# Patient Record
Sex: Female | Born: 1940 | Race: White | Hispanic: No | Marital: Married | Smoking: Former smoker
Health system: Southern US, Community
[De-identification: ages and names within clinical notes are randomized; demographics above are authoritative.]

## PROBLEM LIST (undated history)

## (undated) DIAGNOSIS — F319 Bipolar disorder, unspecified: Secondary | ICD-10-CM

## (undated) DIAGNOSIS — R251 Tremor, unspecified: Secondary | ICD-10-CM

## (undated) DIAGNOSIS — K219 Gastro-esophageal reflux disease without esophagitis: Secondary | ICD-10-CM

## (undated) DIAGNOSIS — F329 Major depressive disorder, single episode, unspecified: Secondary | ICD-10-CM

## (undated) DIAGNOSIS — F32A Depression, unspecified: Secondary | ICD-10-CM

## (undated) DIAGNOSIS — F419 Anxiety disorder, unspecified: Secondary | ICD-10-CM

## (undated) DIAGNOSIS — R7303 Prediabetes: Secondary | ICD-10-CM

## (undated) DIAGNOSIS — E041 Nontoxic single thyroid nodule: Secondary | ICD-10-CM

## (undated) DIAGNOSIS — R0982 Postnasal drip: Secondary | ICD-10-CM

## (undated) DIAGNOSIS — I10 Essential (primary) hypertension: Secondary | ICD-10-CM

## (undated) DIAGNOSIS — F209 Schizophrenia, unspecified: Secondary | ICD-10-CM

## (undated) HISTORY — PX: EYE SURGERY: SHX253

## (undated) HISTORY — PX: TONSILLECTOMY: SUR1361

---

## 2017-01-08 ENCOUNTER — Emergency Department: Payer: Medicare Other

## 2017-01-08 ENCOUNTER — Emergency Department
Admission: EM | Admit: 2017-01-08 | Discharge: 2017-01-08 | Disposition: A | Discharge status: Home / Self Care | Payer: Medicare Other | Attending: Emergency Medicine | Admitting: Emergency Medicine

## 2017-01-08 DIAGNOSIS — Y998 Other external cause status: Secondary | ICD-10-CM | POA: Insufficient documentation

## 2017-01-08 DIAGNOSIS — S0012XA Contusion of left eyelid and periocular area, initial encounter: Secondary | ICD-10-CM | POA: Diagnosis not present

## 2017-01-08 DIAGNOSIS — R2689 Other abnormalities of gait and mobility: Secondary | ICD-10-CM | POA: Diagnosis present

## 2017-01-08 DIAGNOSIS — W06XXXA Fall from bed, initial encounter: Secondary | ICD-10-CM | POA: Diagnosis not present

## 2017-01-08 DIAGNOSIS — J019 Acute sinusitis, unspecified: Secondary | ICD-10-CM | POA: Diagnosis not present

## 2017-01-08 DIAGNOSIS — Y9389 Activity, other specified: Secondary | ICD-10-CM | POA: Insufficient documentation

## 2017-01-08 DIAGNOSIS — Y92122 Bedroom in nursing home as the place of occurrence of the external cause: Secondary | ICD-10-CM | POA: Diagnosis not present

## 2017-01-08 LAB — CBC
HCT: 36.8 % (ref 35.0–47.0)
Hemoglobin: 12.6 g/dL (ref 12.0–16.0)
MCH: 32.2 pg (ref 26.0–34.0)
MCHC: 34.3 g/dL (ref 32.0–36.0)
MCV: 93.9 fL (ref 80.0–100.0)
PLATELETS: 243 10*3/uL (ref 150–440)
RBC: 3.92 MIL/uL (ref 3.80–5.20)
RDW: 13.2 % (ref 11.5–14.5)
WBC: 7.7 10*3/uL (ref 3.6–11.0)

## 2017-01-08 LAB — URINALYSIS, COMPLETE (UACMP) WITH MICROSCOPIC
BACTERIA UA: NONE SEEN
BILIRUBIN URINE: NEGATIVE
Glucose, UA: NEGATIVE mg/dL
Hgb urine dipstick: NEGATIVE
KETONES UR: NEGATIVE mg/dL
LEUKOCYTES UA: NEGATIVE
Nitrite: NEGATIVE
PH: 7 (ref 5.0–8.0)
PROTEIN: NEGATIVE mg/dL
RBC / HPF: NONE SEEN RBC/hpf (ref 0–5)
Specific Gravity, Urine: 1.003 — ABNORMAL LOW (ref 1.005–1.030)

## 2017-01-08 LAB — COMPREHENSIVE METABOLIC PANEL
ALBUMIN: 3.6 g/dL (ref 3.5–5.0)
ALK PHOS: 82 U/L (ref 38–126)
ALT: 14 U/L (ref 14–54)
AST: 17 U/L (ref 15–41)
Anion gap: 5 (ref 5–15)
BUN: 11 mg/dL (ref 6–20)
CHLORIDE: 107 mmol/L (ref 101–111)
CO2: 26 mmol/L (ref 22–32)
CREATININE: 0.63 mg/dL (ref 0.44–1.00)
Calcium: 9.6 mg/dL (ref 8.9–10.3)
GFR calc Af Amer: >60 mL/min (ref >60)
GFR calc non Af Amer: >60 mL/min (ref >60)
GLUCOSE: 125 mg/dL — AB (ref 65–99)
Potassium: 3.9 mmol/L (ref 3.5–5.1)
SODIUM: 138 mmol/L (ref 135–145)
Total Bilirubin: 0.8 mg/dL (ref 0.3–1.2)
Total Protein: 6.6 g/dL (ref 6.5–8.1)

## 2017-01-08 LAB — TROPONIN I: Troponin I: <0.03 ng/mL (ref <0.03)

## 2017-01-08 IMAGING — CT CT HEAD W/O CM
3 of 6 series · 16 of 47 positions shown, 19 images · non-contrast
Comparison: None.

CLINICAL DATA: Fall 2 days prior with left periorbital bruising.

EXAM:
CT HEAD WITHOUT CONTRAST
TECHNIQUE: Contiguous axial images were obtained from the base of the skull
through the vertex without intravenous contrast.

[Series 3: head wo · axial · 0.42mm/px · z∈[-49,+86]mm · 11 of 31 slices shown, 14 images]
[im 2/31  brain]
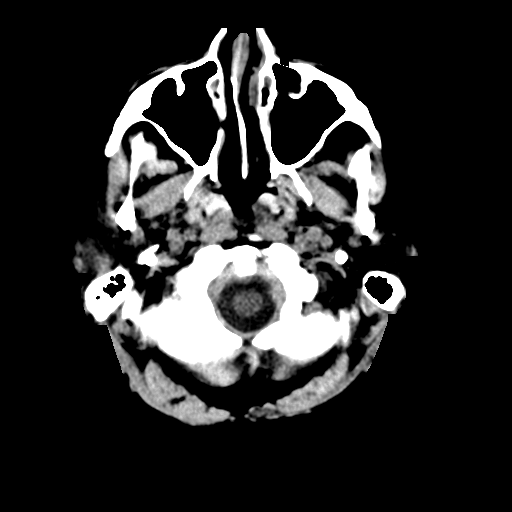
[im 2/31  bone]
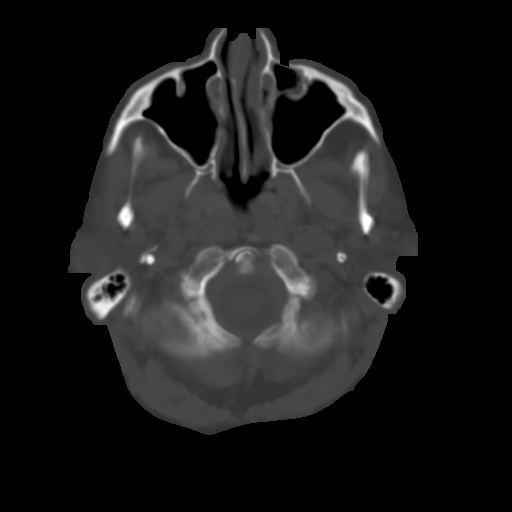
[im 6/31  brain]
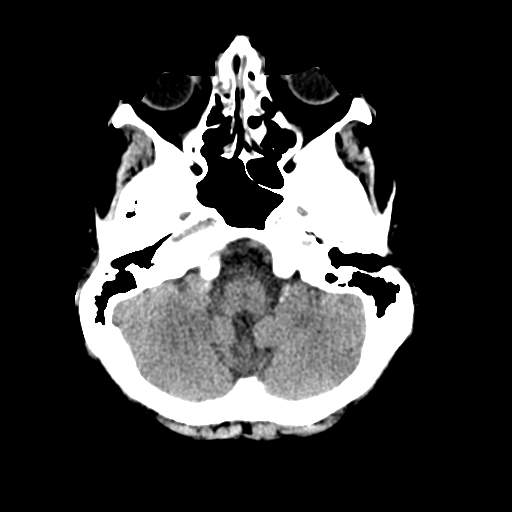
[im 7/31  brain]
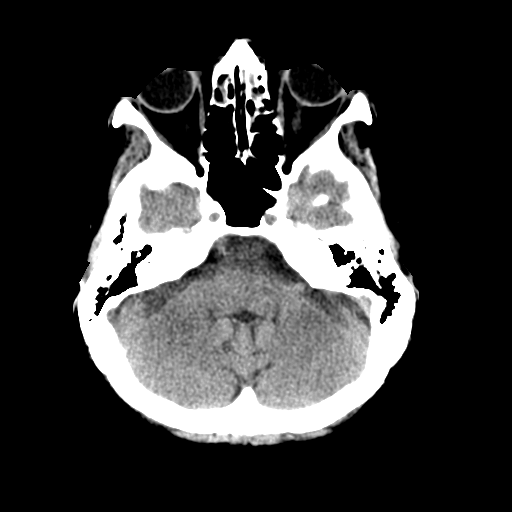
[im 11/31  brain]
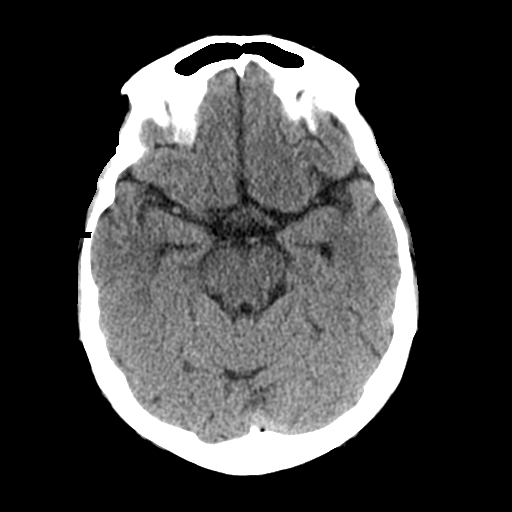
[im 12/31  brain]
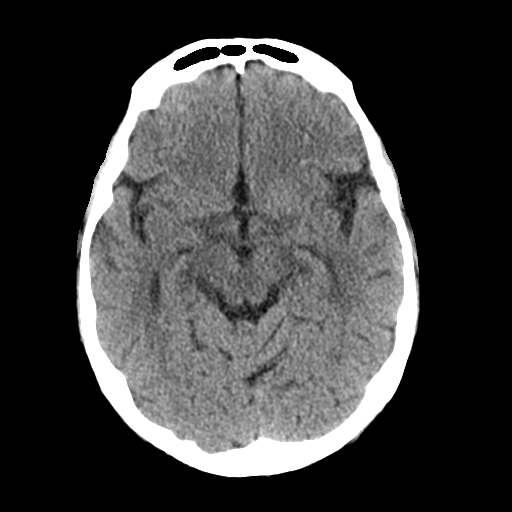
[im 12/31  bone]
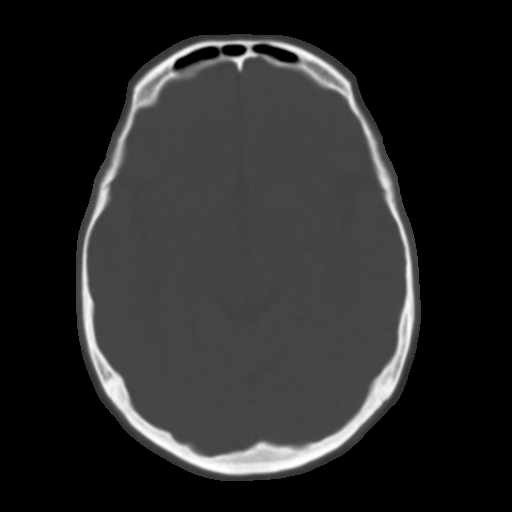
[im 16/31  brain]
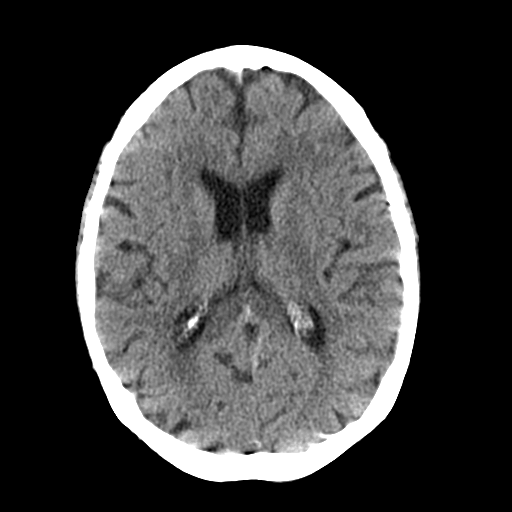
[im 19/31  brain]
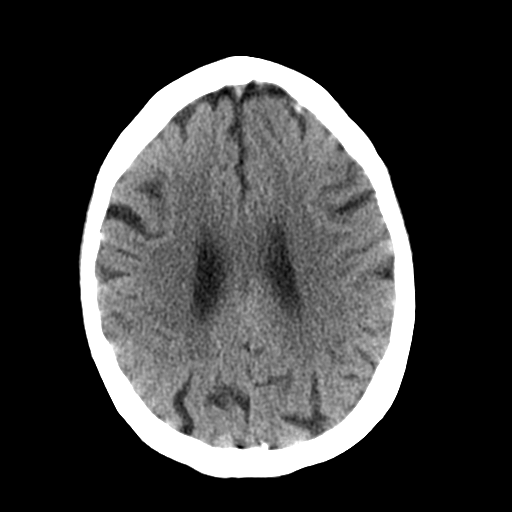
[im 21/31  brain]
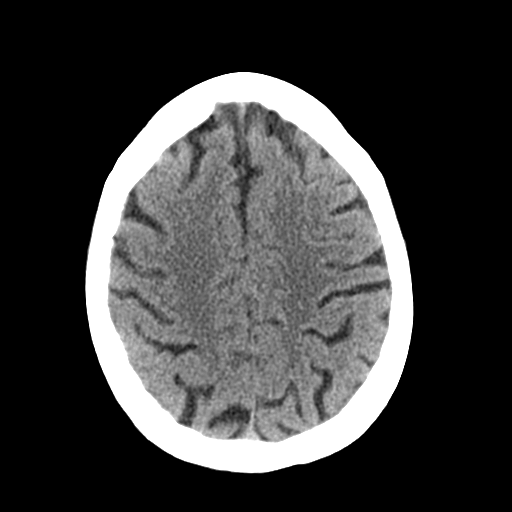
[im 24/31  brain]
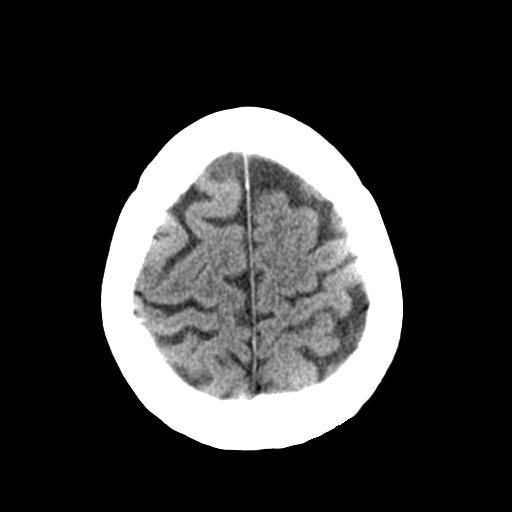
[im 24/31  bone]
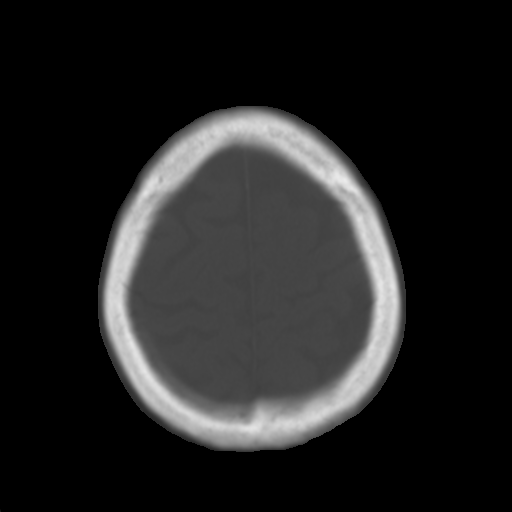
[im 26/31  brain]
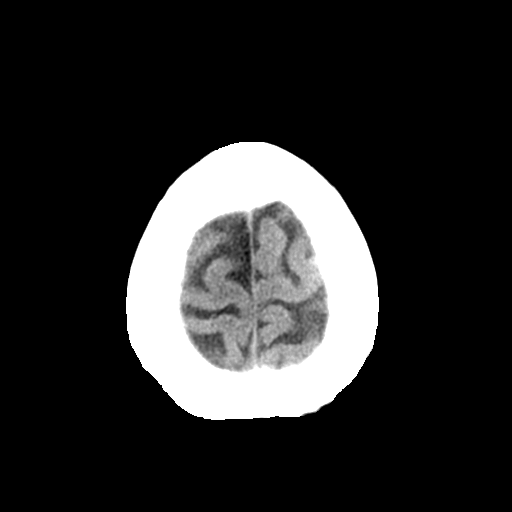
[im 29/31  brain]
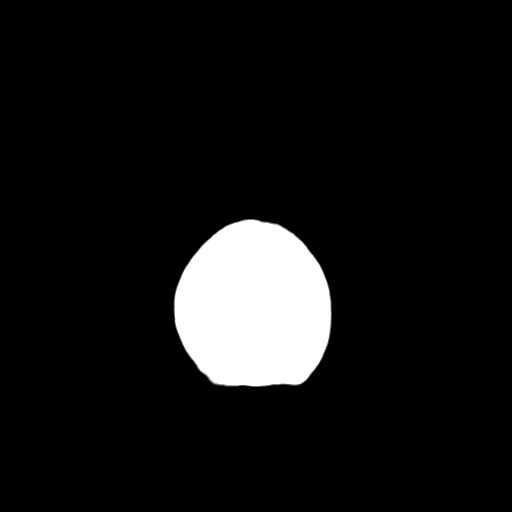

[Series 4: coronal soft tissue · coronal · 0.32mm/px · 3 of 61 slices shown]
[im 16/61  brain]
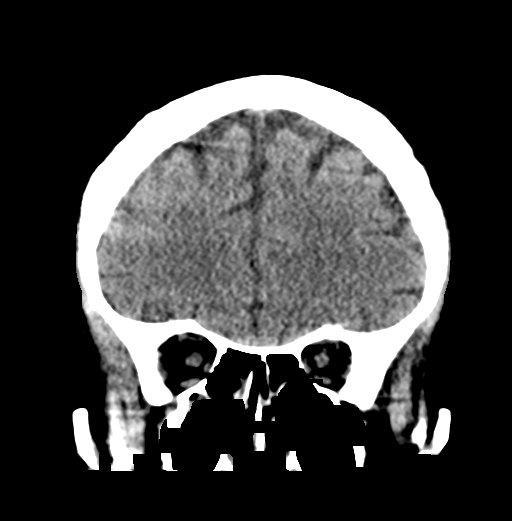
[im 31/61  brain]
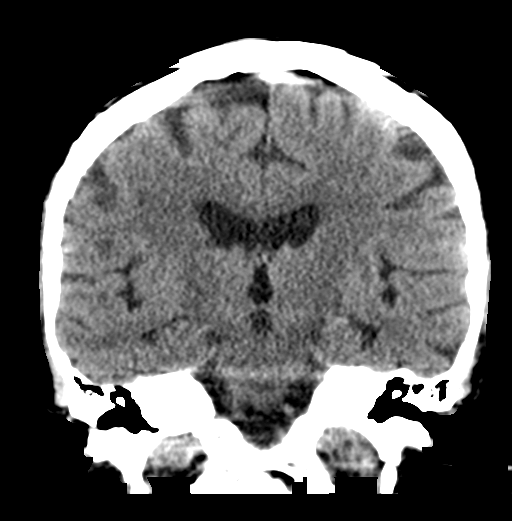
[im 46/61  brain]
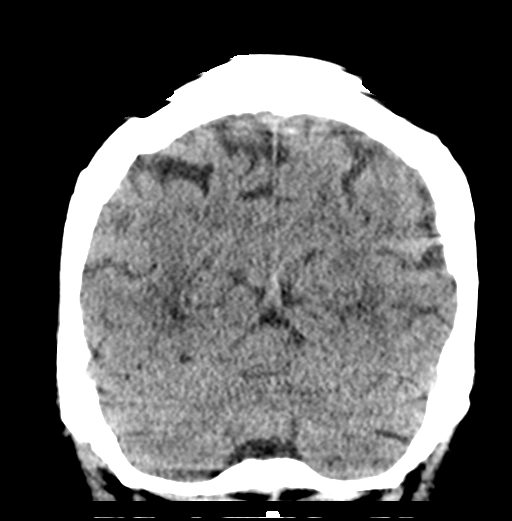

[Series 5: sagittal soft tissue · sagittal · 0.31mm/px · 2 of 49 slices shown]
[im 17/49  brain]
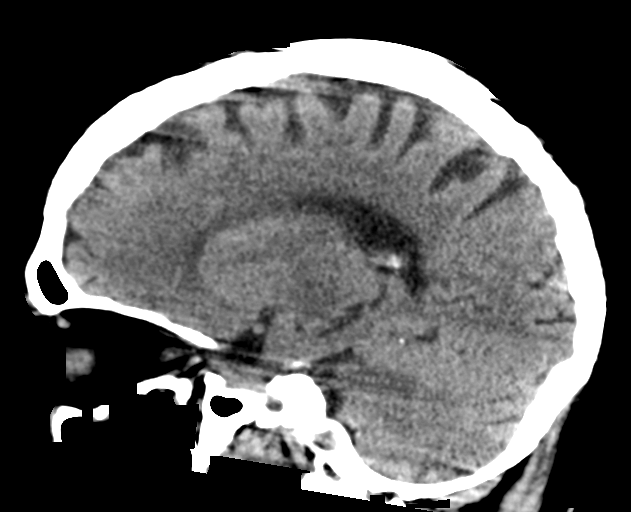
[im 33/49  brain]
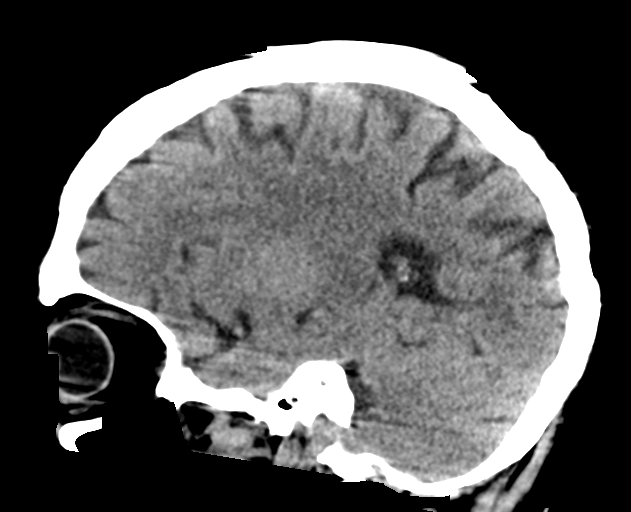

[16 of 47 positions shown; findings below may reference images not displayed]

FINDINGS: Brain: No evidence of acute infarction, hemorrhage, hydrocephalus,
extra-axial collection or mass lesion/mass effect. Mild atrophy,
normal for age.

Vascular: Atherosclerosis of skullbase vasculature without
hyperdense vessel or abnormal calcification.

Skull: Normal. Negative for fracture or focal lesion.

Sinuses/Orbits: Mucosal thickening of the ethmoid air cells and
right maxillary sinus. Mastoid air cells are clear. Visualized
orbits are unremarkable.

Other: None.
IMPRESSION: 1.  No acute intracranial abnormality.  No skull fracture.
2. Paranasal sinus inflammatory change primarily involving the
ethmoid air cells.

## 2017-01-08 MED ORDER — AMOXICILLIN-POT CLAVULANATE 875-125 MG PO TABS: 1.0000 | ORAL_TABLET | Freq: Two times a day (BID) | ORAL | 0 refills | Status: AC

## 2017-01-08 NOTE — ED Triage Notes (Signed)
Patient reports fall 2 days ago. Pt from the [facility]. Pt sent to ED for increased difficulty ambulating today.   Patient c/o pain below left ribcage. Pt has bruising left hip/lateral abdomen. Pt has bruising to left eye

## 2017-01-08 NOTE — ED Notes (Signed)
Patient transported to CT 

## 2017-01-08 NOTE — ED Provider Notes (Signed)
[facility] Emergency Department Provider Note    First MD Initiated Contact with Patient 01/08/17 0315     (approximate)  I have reviewed the triage vital signs and the nursing notes.   HISTORY  Chief Complaint Fall    HPI Danielle Steele is a 76 y.o. female presents to the emergency department via EMS from the [place] with chief complaint of difficulty with ambulation. Per EMS staff at the nursing facility informed and the patient was unsteady when she walked and stumbled to into the wall. Patient admits to rolling out of bed 2 days ago with resultant left periorbital contusion. Patient denied any loss of consciousness at that time. Patient does admit to recent nasal congestion over the past 3 days.   Past medical history  There are no active problems to display for this patient.   Past surgical history  Prior to Admission medications   Not on File    Allergies Citrus  No family history on file.  Social History Social History  Substance Use Topics  . Smoking status: Not on file  . Smokeless tobacco: Not on file  . Alcohol use Not on file    Review of Systems Constitutional: No fever/chills Eyes: No visual changes. ENT: No sore throat. Positive for nasal congestion Cardiovascular: Denies chest pain. Respiratory: Denies shortness of breath. Gastrointestinal: No abdominal pain.  No nausea, no vomiting.  No diarrhea.  No constipation. Genitourinary: Negative for dysuria. Musculoskeletal: Negative for neck pain.  Negative for back pain. Integumentary: Negative for rash. Neurological: Negative for headaches, focal weakness or numbness.   ____________________________________________   PHYSICAL EXAM:  VITAL SIGNS: ED Triage Vitals  Enc Vitals Group     BP 01/08/17 0320 (!) 119/59     Pulse Rate 01/08/17 0320 70     Resp 01/08/17 0320 15     Temp 01/08/17 0320 97.9 F (36.6 C)     Temp Source 01/08/17 0320 Oral     SpO2  01/08/17 0320 95 %     Weight 01/08/17 0319 74.3 kg (163 lb 12.8 oz)     Height 01/08/17 0319 1.664 m (5' 5.5")     Head Circumference --      Peak Flow --      Pain Score 01/08/17 0320 1     Pain Loc --      Pain Edu? --      Excl. in [place]? --     Constitutional: Alert and oriented. Well appearing and in no acute distress. Eyes: Conjunctivae are normal.  Head: Atraumatic. Ears:  Healthy appearing ear canals and clear fluid posterior TMs bilaterally. Nose: Positive for congestion/rhinnorhea. Mouth/Throat: Mucous membranes are moist.  Oropharynx non-erythematous. Neck: No stridor.   Cardiovascular: Normal rate, regular rhythm. Good peripheral circulation. Grossly normal heart sounds. Respiratory: Normal respiratory effort.  No retractions. Lungs CTAB. Gastrointestinal: Soft and nontender. No distention.  Musculoskeletal: No lower extremity tenderness nor edema. No gross deformities of extremities. Neurologic:  Normal speech and language. No gross focal neurologic deficits are appreciated.  Skin:  Skin is warm, dry and intact. No rash noted. Psychiatric: Mood and affect are normal. Speech and behavior are normal.  ____________________________________________   LABS (all labs ordered are listed, but only abnormal results are displayed)  Labs Reviewed  URINALYSIS, COMPLETE (UACMP) WITH MICROSCOPIC - Abnormal; Notable for the following:       Result Value   Color, Urine STRAW (*)    APPearance CLEAR (*)  Specific Gravity, Urine 1.003 (*)    Squamous Epithelial / LPF 0-5 (*)    All other components within normal limits  COMPREHENSIVE METABOLIC PANEL - Abnormal; Notable for the following:    Glucose, Bld 125 (*)    All other components within normal limits  CBC  TROPONIN I    RADIOLOGY I, [place] Juniper Snyders, personally viewed and evaluated these images (plain radiographs) as part of my medical decision making, as well as reviewing the written report by the radiologist.  Ct  Head Wo Contrast  Result Date: 01/08/2017 CLINICAL DATA:  Fall 2 days prior with left periorbital bruising. EXAM: CT HEAD WITHOUT CONTRAST TECHNIQUE: Contiguous axial images were obtained from the base of the skull through the vertex without intravenous contrast. COMPARISON:  None. FINDINGS: Brain: No evidence of acute infarction, hemorrhage, hydrocephalus, extra-axial collection or mass lesion/mass effect. Mild atrophy, normal for age. Vascular: Atherosclerosis of skullbase vasculature without hyperdense vessel or abnormal calcification. Skull: Normal. Negative for fracture or focal lesion. Sinuses/Orbits: Mucosal thickening of the ethmoid air cells and right maxillary sinus. Mastoid air cells are clear. Visualized orbits are unremarkable. Other: None. IMPRESSION: 1.  No acute intracranial abnormality.  No skull fracture. 2. Paranasal sinus inflammatory change primarily involving the ethmoid air cells. Electronically Signed   By: Jeb Levering M.D.   On: 01/08/2017 04:50      Procedures   ____________________________________________   INITIAL IMPRESSION / ASSESSMENT AND PLAN / ED COURSE  Pertinent labs & imaging results that were available during my care of the patient were reviewed by me and considered in my medical decision making (see chart for details).  76 year old female presenting from the [place] with unsteady gait CT head revealed evidence of sinusitis which is consistent with clinical findings on exam. Laboratory data unremarkable including urinalysis      ____________________________________________  FINAL CLINICAL IMPRESSION(S) / ED DIAGNOSES  Final diagnoses:  Acute sinusitis, recurrence not specified, unspecified location     MEDICATIONS GIVEN DURING THIS VISIT:  Medications - No data to display   NEW OUTPATIENT MEDICATIONS STARTED DURING THIS VISIT:  New Prescriptions   No medications on file    Modified Medications   No medications on file     Discontinued Medications   No medications on file     Note:  This document was prepared using Dragon voice recognition software and may include unintentional dictation errors.    Gregor Hams, MD 01/08/17 [phone]

## 2017-01-08 NOTE — ED Notes (Signed)
Called facility to give report on patient.

## 2017-01-08 NOTE — ED Notes (Signed)
Reviewed d/c instructions, follow-up care and prescription with patient. Pt verbalized understanding.

## 2017-08-31 ENCOUNTER — Encounter: Payer: Self-pay | Admitting: *Deleted

## 2017-09-07 ENCOUNTER — Ambulatory Visit
Admission: RE | Admit: 2017-09-07 | Discharge: 2017-09-07 | Disposition: A | Discharge status: Home / Self Care | Payer: Medicare Other | Source: Ambulatory Visit | Attending: Ophthalmology | Admitting: Ophthalmology

## 2017-09-07 ENCOUNTER — Encounter
Admission: RE | Disposition: A | Discharge status: Home / Self Care | Payer: Self-pay | Source: Ambulatory Visit | Attending: Ophthalmology

## 2017-09-07 ENCOUNTER — Ambulatory Visit: Payer: Medicare Other | Admitting: Certified Registered"

## 2017-09-07 ENCOUNTER — Other Ambulatory Visit: Payer: Self-pay

## 2017-09-07 ENCOUNTER — Encounter: Payer: Self-pay | Admitting: Anesthesiology

## 2017-09-07 DIAGNOSIS — I10 Essential (primary) hypertension: Secondary | ICD-10-CM | POA: Insufficient documentation

## 2017-09-07 DIAGNOSIS — Z79899 Other long term (current) drug therapy: Secondary | ICD-10-CM | POA: Diagnosis not present

## 2017-09-07 DIAGNOSIS — F250 Schizoaffective disorder, bipolar type: Secondary | ICD-10-CM | POA: Diagnosis not present

## 2017-09-07 DIAGNOSIS — Z87891 Personal history of nicotine dependence: Secondary | ICD-10-CM | POA: Diagnosis not present

## 2017-09-07 DIAGNOSIS — R251 Tremor, unspecified: Secondary | ICD-10-CM | POA: Diagnosis not present

## 2017-09-07 DIAGNOSIS — H2512 Age-related nuclear cataract, left eye: Secondary | ICD-10-CM | POA: Insufficient documentation

## 2017-09-07 DIAGNOSIS — K219 Gastro-esophageal reflux disease without esophagitis: Secondary | ICD-10-CM | POA: Insufficient documentation

## 2017-09-07 DIAGNOSIS — E559 Vitamin D deficiency, unspecified: Secondary | ICD-10-CM | POA: Insufficient documentation

## 2017-09-07 DIAGNOSIS — E785 Hyperlipidemia, unspecified: Secondary | ICD-10-CM | POA: Insufficient documentation

## 2017-09-07 DIAGNOSIS — F419 Anxiety disorder, unspecified: Secondary | ICD-10-CM | POA: Diagnosis not present

## 2017-09-07 DIAGNOSIS — M8580 Other specified disorders of bone density and structure, unspecified site: Secondary | ICD-10-CM | POA: Diagnosis not present

## 2017-09-07 HISTORY — DX: Tremor, unspecified: R25.1

## 2017-09-07 HISTORY — DX: Anxiety disorder, unspecified: F41.9

## 2017-09-07 HISTORY — DX: Prediabetes: R73.03

## 2017-09-07 HISTORY — DX: Postnasal drip: R09.82

## 2017-09-07 HISTORY — DX: Nontoxic single thyroid nodule: E04.1

## 2017-09-07 HISTORY — DX: Gastro-esophageal reflux disease without esophagitis: K21.9

## 2017-09-07 HISTORY — DX: Bipolar disorder, unspecified: F31.9

## 2017-09-07 HISTORY — DX: Depression, unspecified: F32.A

## 2017-09-07 HISTORY — DX: Schizophrenia, unspecified: F20.9

## 2017-09-07 HISTORY — PX: CATARACT EXTRACTION W/PHACO: SHX586

## 2017-09-07 HISTORY — DX: Essential (primary) hypertension: I10

## 2017-09-07 HISTORY — DX: Major depressive disorder, single episode, unspecified: F32.9

## 2017-09-07 LAB — GLUCOSE, CAPILLARY: Glucose-Capillary: 112 mg/dL — ABNORMAL HIGH (ref 65–99)

## 2017-09-07 SURGERY — PHACOEMULSIFICATION, CATARACT, WITH IOL INSERTION
Anesthesia: Monitor Anesthesia Care | Site: Eye | Laterality: Left | Wound class: Clean

## 2017-09-07 MED ORDER — EPINEPHRINE PF 1 MG/ML IJ SOLN
INTRAOCULAR | Status: DC | PRN
  Administered 2017-09-07: 12:00:00 via OPHTHALMIC

## 2017-09-07 MED ORDER — POVIDONE-IODINE 5 % OP SOLN
OPHTHALMIC | Status: DC | PRN
  Administered 2017-09-07: 1 via OPHTHALMIC

## 2017-09-07 MED ORDER — CARBACHOL 0.01 % IO SOLN
INTRAOCULAR | Status: DC | PRN
  Administered 2017-09-07: 0.5 mL via INTRAOCULAR

## 2017-09-07 MED ORDER — MOXIFLOXACIN HCL 0.5 % OP SOLN: 1.0000 [drp] | OPHTHALMIC | Status: DC | PRN

## 2017-09-07 MED ORDER — LIDOCAINE HCL (PF) 4 % IJ SOLN
INTRAOCULAR | Status: DC | PRN
  Administered 2017-09-07: 4 mL via OPHTHALMIC

## 2017-09-07 MED ORDER — NA CHONDROIT SULF-NA HYALURON 40-17 MG/ML IO SOLN
INTRAOCULAR | Status: DC | PRN
  Administered 2017-09-07: 1 mL via INTRAOCULAR

## 2017-09-07 MED ORDER — LIDOCAINE HCL (PF) 4 % IJ SOLN
INTRAMUSCULAR | Status: AC
  Filled 2017-09-07: qty 5

## 2017-09-07 MED ORDER — ARMC OPHTHALMIC DILATING DROPS
OPHTHALMIC | Status: AC
  Administered 2017-09-07: 1 via OPHTHALMIC
  Filled 2017-09-07: qty 0.4

## 2017-09-07 MED ORDER — MOXIFLOXACIN HCL 0.5 % OP SOLN
OPHTHALMIC | Status: AC
  Filled 2017-09-07: qty 3

## 2017-09-07 MED ORDER — MIDAZOLAM HCL 2 MG/2ML IJ SOLN
INTRAMUSCULAR | Status: DC | PRN
  Administered 2017-09-07: 1 mg via INTRAVENOUS

## 2017-09-07 MED ORDER — MIDAZOLAM HCL 2 MG/2ML IJ SOLN
INTRAMUSCULAR | Status: AC
  Filled 2017-09-07: qty 2

## 2017-09-07 MED ORDER — SODIUM CHLORIDE 0.9 % IV SOLN
INTRAVENOUS | Status: DC
  Administered 2017-09-07: 11:00:00 via INTRAVENOUS

## 2017-09-07 MED ORDER — FENTANYL CITRATE (PF) 100 MCG/2ML IJ SOLN
INTRAMUSCULAR | Status: AC
  Filled 2017-09-07: qty 2

## 2017-09-07 MED ORDER — POVIDONE-IODINE 5 % OP SOLN
OPHTHALMIC | Status: AC
  Filled 2017-09-07: qty 30

## 2017-09-07 MED ORDER — MOXIFLOXACIN HCL 0.5 % OP SOLN
OPHTHALMIC | Status: DC | PRN
  Administered 2017-09-07: 0.2 mL via OPHTHALMIC

## 2017-09-07 MED ORDER — ARMC OPHTHALMIC DILATING DROPS
1.0000 "application " | OPHTHALMIC | Status: AC
  Administered 2017-09-07 (×3): 1 via OPHTHALMIC

## 2017-09-07 MED ORDER — NA CHONDROIT SULF-NA HYALURON 40-17 MG/ML IO SOLN
INTRAOCULAR | Status: AC
  Filled 2017-09-07: qty 1

## 2017-09-07 MED ORDER — EPINEPHRINE PF 1 MG/ML IJ SOLN
INTRAMUSCULAR | Status: AC
  Filled 2017-09-07: qty 2

## 2017-09-07 SURGICAL SUPPLY — 16 items
GLOVE BIO SURGEON STRL SZ8 (GLOVE) ×3 IMPLANT
GLOVE BIOGEL M 6.5 STRL (GLOVE) ×3 IMPLANT
GLOVE SURG LX 8.0 MICRO (GLOVE) ×2
GLOVE SURG LX STRL 8.0 MICRO (GLOVE) ×1 IMPLANT
GOWN STRL REUS W/ TWL LRG LVL3 (GOWN DISPOSABLE) ×2 IMPLANT
GOWN STRL REUS W/TWL LRG LVL3 (GOWN DISPOSABLE) ×4
LABEL CATARACT MEDS ST (LABEL) ×3 IMPLANT
LENS IOL TECNIS ITEC 25.0 (Intraocular Lens) ×3 IMPLANT
PACK CATARACT (MISCELLANEOUS) ×3 IMPLANT
PACK CATARACT BRASINGTON LX (MISCELLANEOUS) ×3 IMPLANT
PACK EYE AFTER SURG (MISCELLANEOUS) ×3 IMPLANT
SOL BSS BAG (MISCELLANEOUS) ×3
SOLUTION BSS BAG (MISCELLANEOUS) ×1 IMPLANT
SYR 5ML LL (SYRINGE) ×3 IMPLANT
WATER STERILE IRR 250ML POUR (IV SOLUTION) ×3 IMPLANT
WIPE NON LINTING 3.25X3.25 (MISCELLANEOUS) ×3 IMPLANT

## 2017-09-07 NOTE — Transfer of Care (Signed)
Immediate Anesthesia Transfer of Care Note  Patient: Danielle Steele  Procedure(s) Performed: CATARACT EXTRACTION PHACO AND INTRAOCULAR LENS PLACEMENT (IOC) (Left Eye)  Patient Location: PACU  Anesthesia Type:MAC  Level of Consciousness: awake, alert  and oriented  Airway & Oxygen Therapy: Patient Spontanous Breathing  Post-op Assessment: Report given to RN and Post -op Vital signs reviewed and stable  Post vital signs: Reviewed and stable  Last Vitals:  Vitals:   09/07/17 1043 09/07/17 1154  BP: (!) 99/55 (!) 94/47  Pulse: 67   Resp: 18 13  Temp: (!) 36.2 C   SpO2: 97% 98%    Last Pain:  Vitals:   09/07/17 1043  TempSrc: Tympanic         Complications: No apparent anesthesia complications

## 2017-09-07 NOTE — H&P (Signed)
All labs reviewed. Abnormal studies sent to patients PCP when indicated.  Previous H&P reviewed, patient examined, there are NO CHANGES.  Danielle Steele Porfilio2/5/201911:29 AM

## 2017-09-07 NOTE — Anesthesia Postprocedure Evaluation (Signed)
Anesthesia Post Note  Patient: Danielle Steele  Procedure(s) Performed: CATARACT EXTRACTION PHACO AND INTRAOCULAR LENS PLACEMENT ([place]) (Left Eye)  Patient location during evaluation: PACU Anesthesia Type: MAC Level of consciousness: awake, awake and alert and oriented Pain management: pain level controlled Vital Signs Assessment: post-procedure vital signs reviewed and stable Respiratory status: spontaneous breathing and nonlabored ventilation Cardiovascular status: stable     Last Vitals:  Vitals:   09/07/17 1043 09/07/17 1154  BP: (!) 99/55 (!) 94/47  Pulse: 67   Resp: 18 13  Temp: (!) 36.2 C   SpO2: 97% 98%    Last Pain:  Vitals:   09/07/17 1043  TempSrc: Tympanic                 [employer]

## 2017-09-07 NOTE — Discharge Instructions (Signed)
Eye Surgery Discharge Instructions  Expect mild scratchy sensation or mild soreness. DO NOT RUB YOUR EYE!  The day of surgery:  Minimal physical activity, but bed rest is not required  No reading, computer work, or close hand work  No bending, lifting, or straining.  May watch TV  For 24 hours:  No driving, legal decisions, or alcoholic beverages  Safety precautions  Eat anything you prefer: It is better to start with liquids, then soup then solid foods.  _____ Eye patch should be worn until postoperative exam tomorrow.  ____ Solar shield eyeglasses should be worn for comfort in the sunlight/patch while sleeping  Resume all regular medications including aspirin or Coumadin if these were discontinued prior to surgery. You may shower, bathe, shave, or wash your hair. Tylenol may be taken for mild discomfort.  Call your doctor if you experience significant pain, nausea, or vomiting, fever > 101 or other signs of infection. [phone] or [phone] Specific instructions:  Follow-up Information    Birder Robson, MD Follow up.   Specialty:  Ophthalmology Why:  February 6 at 9:10am Contact information: [address] [place] [address] [phone]

## 2017-09-07 NOTE — Anesthesia Procedure Notes (Signed)
Date/Time: 09/07/2017 11:39 AM Performed by: Lance Muss, CRNA Pre-anesthesia Checklist: Patient identified, Emergency Drugs available, Suction available, Patient being monitored and Timeout performed Oxygen Delivery Method: Nasal cannula

## 2017-09-07 NOTE — Anesthesia Preprocedure Evaluation (Signed)
Anesthesia Evaluation  Patient identified by MRN, date of birth, ID band Patient awake    Reviewed: Allergy & Precautions, NPO status , Patient's Chart, lab work & pertinent test results  Airway Mallampati: II  TM Distance: >3 FB     Dental  (+) Teeth Intact   Pulmonary former smoker,    Pulmonary exam normal        Cardiovascular hypertension, Normal cardiovascular exam     Neuro/Psych PSYCHIATRIC DISORDERS Anxiety Depression Bipolar Disorder Schizophrenia    GI/Hepatic Neg liver ROS, GERD  Medicated,  Endo/Other  negative endocrine ROS  Renal/GU negative Renal ROS  negative genitourinary   Musculoskeletal negative musculoskeletal ROS (+)   Abdominal Normal abdominal exam  (+)   Peds negative pediatric ROS (+)  Hematology negative hematology ROS (+)   Anesthesia Other Findings Past Medical History: No date: Anxiety No date: Bipolar disorder ([place]) No date: Depression No date: GERD (gastroesophageal reflux disease) No date: Hypertension No date: PND (post-nasal drip)     Comment:  CAUSES SOME COUGH No date: Pre-diabetes No date: Schizophrenia ([place])     Comment:  SCHIZO  AFFECTIVE DISORDER No date: Thyroid nodule No date: Tremors of nervous system  Reproductive/Obstetrics                             Anesthesia Physical Anesthesia Plan  ASA: II  Anesthesia Plan: MAC   Post-op Pain Management:    Induction: Intravenous  PONV Risk Score and Plan:   Airway Management Planned: Nasal Cannula  Additional Equipment:   Intra-op Plan:   Post-operative Plan:   Informed Consent: I have reviewed the patients History and Physical, chart, labs and discussed the procedure including the risks, benefits and alternatives for the proposed anesthesia with the patient or authorized representative who has indicated his/her understanding and acceptance.   Dental advisory given  Plan  Discussed with: CRNA and Surgeon  Anesthesia Plan Comments:         Anesthesia Quick Evaluation

## 2017-09-07 NOTE — Anesthesia Post-op Follow-up Note (Signed)
Anesthesia QCDR form completed.        

## 2017-09-07 NOTE — Op Note (Signed)
PREOPERATIVE DIAGNOSIS:  Nuclear sclerotic cataract of the left eye.   POSTOPERATIVE DIAGNOSIS:  Nuclear sclerotic cataract of the left eye.   OPERATIVE PROCEDURE: Procedure(s): CATARACT EXTRACTION PHACO AND INTRAOCULAR LENS PLACEMENT (IOC)   SURGEON:  Birder Robson, MD.   ANESTHESIA:  Anesthesiologist: Alvin Critchley, MD CRNA: Lance Muss, CRNA; Nelda Marseille, CRNA  1.      Managed anesthesia care. 2.     0.51ml of Shugarcaine was instilled following the paracentesis   COMPLICATIONS:  None.   TECHNIQUE:   Stop and chop   DESCRIPTION OF PROCEDURE:  The patient was examined and consented in the preoperative holding area where the aforementioned topical anesthesia was applied to the left eye and then brought back to the Operating Room where the left eye was prepped and draped in the usual sterile ophthalmic fashion and a lid speculum was placed. A paracentesis was created with the side port blade and the anterior chamber was filled with viscoelastic. A near clear corneal incision was performed with the steel keratome. A continuous curvilinear capsulorrhexis was performed with a cystotome followed by the capsulorrhexis forceps. Hydrodissection and hydrodelineation were carried out with BSS on a blunt cannula. The lens was removed in a stop and chop  technique and the remaining cortical material was removed with the irrigation-aspiration handpiece. The capsular bag was inflated with viscoelastic and the Technis ZCB00 lens was placed in the capsular bag without complication. The remaining viscoelastic was removed from the eye with the irrigation-aspiration handpiece. The wounds were hydrated. The anterior chamber was flushed with Miostat and the eye was inflated to physiologic pressure. 0.78ml Vigamox was placed in the anterior chamber. The wounds were found to be water tight. The eye was dressed with Vigamox. The patient was given protective glasses to wear throughout the day and a shield with which  to sleep tonight. The patient was also given drops with which to begin a drop regimen today and will follow-up with me in one day. Implant Name Type Inv. Item Serial No. Manufacturer Lot No. LRB No. Used  LENS IOL DIOP 25.0 - [id number] Intraocular Lens LENS IOL DIOP 25.0 [id number]  Left 1    Procedure(s) with comments: CATARACT EXTRACTION PHACO AND INTRAOCULAR LENS PLACEMENT (IOC) (Left) - [place] 00:39.9 AP% 17.4 CDE 6.94 Fluid Pack lot # [id number]  Electronically signed: Birder Robson 09/07/2017 11:51 AM

## 2017-09-28 ENCOUNTER — Ambulatory Visit: Payer: Medicare Other | Admitting: Certified Registered"

## 2017-09-28 ENCOUNTER — Encounter
Admission: RE | Disposition: A | Discharge status: Home / Self Care | Payer: Self-pay | Source: Ambulatory Visit | Attending: Ophthalmology

## 2017-09-28 ENCOUNTER — Encounter: Payer: Self-pay | Admitting: *Deleted

## 2017-09-28 ENCOUNTER — Ambulatory Visit
Admission: RE | Admit: 2017-09-28 | Discharge: 2017-09-28 | Disposition: A | Discharge status: Home / Self Care | Payer: Medicare Other | Source: Ambulatory Visit | Attending: Ophthalmology | Admitting: Ophthalmology

## 2017-09-28 ENCOUNTER — Other Ambulatory Visit: Payer: Self-pay

## 2017-09-28 DIAGNOSIS — Z87891 Personal history of nicotine dependence: Secondary | ICD-10-CM | POA: Diagnosis not present

## 2017-09-28 DIAGNOSIS — F329 Major depressive disorder, single episode, unspecified: Secondary | ICD-10-CM | POA: Diagnosis not present

## 2017-09-28 DIAGNOSIS — E559 Vitamin D deficiency, unspecified: Secondary | ICD-10-CM | POA: Diagnosis not present

## 2017-09-28 DIAGNOSIS — E041 Nontoxic single thyroid nodule: Secondary | ICD-10-CM | POA: Diagnosis not present

## 2017-09-28 DIAGNOSIS — Z79899 Other long term (current) drug therapy: Secondary | ICD-10-CM | POA: Insufficient documentation

## 2017-09-28 DIAGNOSIS — R7303 Prediabetes: Secondary | ICD-10-CM | POA: Diagnosis not present

## 2017-09-28 DIAGNOSIS — I10 Essential (primary) hypertension: Secondary | ICD-10-CM | POA: Insufficient documentation

## 2017-09-28 DIAGNOSIS — F259 Schizoaffective disorder, unspecified: Secondary | ICD-10-CM | POA: Diagnosis not present

## 2017-09-28 DIAGNOSIS — Z9842 Cataract extraction status, left eye: Secondary | ICD-10-CM | POA: Diagnosis not present

## 2017-09-28 DIAGNOSIS — H2511 Age-related nuclear cataract, right eye: Secondary | ICD-10-CM | POA: Insufficient documentation

## 2017-09-28 DIAGNOSIS — F319 Bipolar disorder, unspecified: Secondary | ICD-10-CM | POA: Diagnosis not present

## 2017-09-28 DIAGNOSIS — F419 Anxiety disorder, unspecified: Secondary | ICD-10-CM | POA: Diagnosis not present

## 2017-09-28 DIAGNOSIS — E785 Hyperlipidemia, unspecified: Secondary | ICD-10-CM | POA: Diagnosis not present

## 2017-09-28 HISTORY — PX: CATARACT EXTRACTION W/PHACO: SHX586

## 2017-09-28 LAB — GLUCOSE, CAPILLARY: Glucose-Capillary: 92 mg/dL (ref 65–99)

## 2017-09-28 SURGERY — PHACOEMULSIFICATION, CATARACT, WITH IOL INSERTION
Anesthesia: Monitor Anesthesia Care | Site: Eye | Laterality: Right | Wound class: Clean

## 2017-09-28 MED ORDER — NA HYALUR & NA CHOND-NA HYALUR 0.55-0.5 ML IO KIT
PACK | INTRAOCULAR | Status: AC
  Filled 2017-09-28: qty 1.05

## 2017-09-28 MED ORDER — POVIDONE-IODINE 5 % OP SOLN
OPHTHALMIC | Status: DC | PRN
  Administered 2017-09-28: 1 via OPHTHALMIC

## 2017-09-28 MED ORDER — LIDOCAINE HCL (PF) 4 % IJ SOLN
INTRAMUSCULAR | Status: AC
  Filled 2017-09-28: qty 5

## 2017-09-28 MED ORDER — NA CHONDROIT SULF-NA HYALURON 40-17 MG/ML IO SOLN
INTRAOCULAR | Status: DC | PRN
  Administered 2017-09-28: 1 mL via INTRAOCULAR

## 2017-09-28 MED ORDER — FENTANYL CITRATE (PF) 100 MCG/2ML IJ SOLN
INTRAMUSCULAR | Status: AC
  Filled 2017-09-28: qty 2

## 2017-09-28 MED ORDER — ARMC OPHTHALMIC DILATING DROPS
OPHTHALMIC | Status: AC
  Filled 2017-09-28: qty 0.4

## 2017-09-28 MED ORDER — POVIDONE-IODINE 5 % OP SOLN
OPHTHALMIC | Status: AC
  Filled 2017-09-28: qty 30

## 2017-09-28 MED ORDER — FENTANYL CITRATE (PF) 100 MCG/2ML IJ SOLN
INTRAMUSCULAR | Status: DC | PRN
  Administered 2017-09-28 (×2): 25 ug via INTRAVENOUS

## 2017-09-28 MED ORDER — LIDOCAINE HCL (PF) 4 % IJ SOLN
INTRAOCULAR | Status: DC | PRN
  Administered 2017-09-28: 4 mL via OPHTHALMIC

## 2017-09-28 MED ORDER — MIDAZOLAM HCL 2 MG/2ML IJ SOLN
INTRAMUSCULAR | Status: DC | PRN
  Administered 2017-09-28 (×2): 0.5 mg via INTRAVENOUS

## 2017-09-28 MED ORDER — SODIUM CHLORIDE 0.9 % IV SOLN
INTRAVENOUS | Status: DC
  Administered 2017-09-28: 07:00:00 via INTRAVENOUS

## 2017-09-28 MED ORDER — CARBACHOL 0.01 % IO SOLN
INTRAOCULAR | Status: DC | PRN
  Administered 2017-09-28: 0.5 mL via INTRAOCULAR

## 2017-09-28 MED ORDER — BSS IO SOLN
INTRAOCULAR | Status: DC | PRN
  Administered 2017-09-28: 08:00:00 via OPHTHALMIC

## 2017-09-28 MED ORDER — EPINEPHRINE PF 1 MG/ML IJ SOLN
INTRAMUSCULAR | Status: AC
  Filled 2017-09-28: qty 2

## 2017-09-28 MED ORDER — ARMC OPHTHALMIC DILATING DROPS
1.0000 "application " | OPHTHALMIC | Status: DC
  Administered 2017-09-28 (×3): 1 via OPHTHALMIC

## 2017-09-28 MED ORDER — MOXIFLOXACIN HCL 0.5 % OP SOLN
OPHTHALMIC | Status: DC | PRN
  Administered 2017-09-28: 0.2 mL via OPHTHALMIC

## 2017-09-28 MED ORDER — MIDAZOLAM HCL 2 MG/2ML IJ SOLN
INTRAMUSCULAR | Status: AC
  Filled 2017-09-28: qty 2

## 2017-09-28 MED ORDER — MOXIFLOXACIN HCL 0.5 % OP SOLN: 1.0000 [drp] | OPHTHALMIC | Status: DC | PRN

## 2017-09-28 MED ORDER — MOXIFLOXACIN HCL 0.5 % OP SOLN
OPHTHALMIC | Status: AC
  Filled 2017-09-28: qty 3

## 2017-09-28 SURGICAL SUPPLY — 16 items
GLOVE BIO SURGEON STRL SZ8 (GLOVE) ×3 IMPLANT
GLOVE BIOGEL M 6.5 STRL (GLOVE) ×3 IMPLANT
GLOVE SURG LX 8.0 MICRO (GLOVE) ×2
GLOVE SURG LX STRL 8.0 MICRO (GLOVE) ×1 IMPLANT
GOWN STRL REUS W/ TWL LRG LVL3 (GOWN DISPOSABLE) ×2 IMPLANT
GOWN STRL REUS W/TWL LRG LVL3 (GOWN DISPOSABLE) ×4
LABEL CATARACT MEDS ST (LABEL) ×3 IMPLANT
LENS IOL TECNIS ITEC 23.5 (Intraocular Lens) ×3 IMPLANT
PACK CATARACT (MISCELLANEOUS) ×3 IMPLANT
PACK CATARACT BRASINGTON LX (MISCELLANEOUS) ×3 IMPLANT
PACK EYE AFTER SURG (MISCELLANEOUS) ×3 IMPLANT
SOL BSS BAG (MISCELLANEOUS) ×3
SOLUTION BSS BAG (MISCELLANEOUS) ×1 IMPLANT
SYR 5ML LL (SYRINGE) ×3 IMPLANT
WATER STERILE IRR 250ML POUR (IV SOLUTION) ×3 IMPLANT
WIPE NON LINTING 3.25X3.25 (MISCELLANEOUS) ×3 IMPLANT

## 2017-09-28 NOTE — Op Note (Signed)
PREOPERATIVE DIAGNOSIS:  Nuclear sclerotic cataract of the right eye.   POSTOPERATIVE DIAGNOSIS:  NUCLEAR SCLEROTIC CATARACT RIGHT EYE   OPERATIVE PROCEDURE: Procedure(s): CATARACT EXTRACTION PHACO AND INTRAOCULAR LENS PLACEMENT (IOC)   SURGEON:  Birder Robson, MD.   ANESTHESIA:  Anesthesiologist: Martha Clan, MD CRNA: Philbert Riser, CRNA  1.      Managed anesthesia care. 2.      0.57ml of Shugarcaine was instilled in the eye following the paracentesis.   COMPLICATIONS:  None.   TECHNIQUE:   Stop and chop   DESCRIPTION OF PROCEDURE:  The patient was examined and consented in the preoperative holding area where the aforementioned topical anesthesia was applied to the right eye and then brought back to the Operating Room where the right eye was prepped and draped in the usual sterile ophthalmic fashion and a lid speculum was placed. A paracentesis was created with the side port blade and the anterior chamber was filled with viscoelastic. A near clear corneal incision was performed with the steel keratome. A continuous curvilinear capsulorrhexis was performed with a cystotome followed by the capsulorrhexis forceps. Hydrodissection and hydrodelineation were carried out with BSS on a blunt cannula. The lens was removed in a stop and chop  technique and the remaining cortical material was removed with the irrigation-aspiration handpiece. The capsular bag was inflated with viscoelastic and the Technis ZCB00  lens was placed in the capsular bag without complication. The remaining viscoelastic was removed from the eye with the irrigation-aspiration handpiece. The wounds were hydrated. The anterior chamber was flushed with Miostat and the eye was inflated to physiologic pressure. 0.12ml of Vigamox was placed in the anterior chamber. The wounds were found to be water tight. The eye was dressed with Vigamox. The patient was given protective glasses to wear throughout the day and a shield with which to  sleep tonight. The patient was also given drops with which to begin a drop regimen today and will follow-up with me in one day. Implant Name Type Inv. Item Serial No. Manufacturer Lot No. LRB No. Used  LENS IOL DIOP 23.5 - [id number] Intraocular Lens LENS IOL DIOP 23.5 [id number] AMO  Right 1   Procedure(s) with comments: CATARACT EXTRACTION PHACO AND INTRAOCULAR LENS PLACEMENT (IOC) (Right) - [place] 01:31.8 AP% 13.7 CDE 12.55 Fluid Pack Lot # [id number]   Electronically signed: Birder Robson 09/28/2017 8:20 AM

## 2017-09-28 NOTE — Anesthesia Postprocedure Evaluation (Signed)
Anesthesia Post Note  Patient: Danielle Steele  Procedure(s) Performed: CATARACT EXTRACTION PHACO AND INTRAOCULAR LENS PLACEMENT ([place]) (Right Eye)  Patient location during evaluation: PACU Anesthesia Type: MAC Level of consciousness: awake, awake and alert and oriented Pain management: pain level controlled Respiratory status: spontaneous breathing Cardiovascular status: blood pressure returned to baseline Postop Assessment: no headache Anesthetic complications: no     Last Vitals:  Vitals:   09/28/17 0640  BP: (!) 92/34  Pulse: 65  Resp: 16  Temp: 36.6 C  SpO2: 99%    Last Pain:  Vitals:   09/28/17 0640  TempSrc: Tympanic                 Philbert Riser

## 2017-09-28 NOTE — H&P (Signed)
All labs reviewed. Abnormal studies sent to patients PCP when indicated.  Previous H&P reviewed, patient examined, there are NO CHANGES.  Danielle Huser Porfilio2/26/20197:52 AM

## 2017-09-28 NOTE — Anesthesia Preprocedure Evaluation (Signed)
Anesthesia Evaluation  Patient identified by MRN, date of birth, ID band Patient awake    Reviewed: Allergy & Precautions, NPO status , Patient's Chart, lab work & pertinent test results  Airway Mallampati: II  TM Distance: >3 FB     Dental  (+) Teeth Intact   Pulmonary former smoker,    Pulmonary exam normal        Cardiovascular hypertension, Normal cardiovascular exam     Neuro/Psych PSYCHIATRIC DISORDERS Anxiety Depression Bipolar Disorder Schizophrenia    GI/Hepatic Neg liver ROS, GERD  Medicated,  Endo/Other  negative endocrine ROS  Renal/GU negative Renal ROS  negative genitourinary   Musculoskeletal negative musculoskeletal ROS (+)   Abdominal Normal abdominal exam  (+)   Peds negative pediatric ROS (+)  Hematology negative hematology ROS (+)   Anesthesia Other Findings Past Medical History: No date: Anxiety No date: Bipolar disorder ([place]) No date: Depression No date: GERD (gastroesophageal reflux disease) No date: Hypertension No date: PND (post-nasal drip)     Comment:  CAUSES SOME COUGH No date: Pre-diabetes No date: Schizophrenia ([place])     Comment:  SCHIZO  AFFECTIVE DISORDER No date: Thyroid nodule No date: Tremors of nervous system  Reproductive/Obstetrics                             Anesthesia Physical  Anesthesia Plan  ASA: II  Anesthesia Plan: MAC   Post-op Pain Management:    Induction: Intravenous  PONV Risk Score and Plan:   Airway Management Planned: Nasal Cannula  Additional Equipment:   Intra-op Plan:   Post-operative Plan:   Informed Consent: I have reviewed the patients History and Physical, chart, labs and discussed the procedure including the risks, benefits and alternatives for the proposed anesthesia with the patient or authorized representative who has indicated his/her understanding and acceptance.   Dental advisory given  Plan  Discussed with: CRNA and Surgeon  Anesthesia Plan Comments:         Anesthesia Quick Evaluation

## 2017-09-28 NOTE — Anesthesia Post-op Follow-up Note (Signed)
Anesthesia QCDR form completed.        

## 2017-09-28 NOTE — Discharge Instructions (Signed)
Eye Surgery Discharge Instructions  Expect mild scratchy sensation or mild soreness. DO NOT RUB YOUR EYE!  The day of surgery:  Minimal physical activity, but bed rest is not required  No reading, computer work, or close hand work  No bending, lifting, or straining.  May watch TV  For 24 hours:  No driving, legal decisions, or alcoholic beverages  Safety precautions  Eat anything you prefer: It is better to start with liquids, then soup then solid foods.  _____ Eye patch should be worn until postoperative exam tomorrow.  ____ Solar shield eyeglasses should be worn for comfort in the sunlight/patch while sleeping  Resume all regular medications including aspirin or Coumadin if these were discontinued prior to surgery. You may shower, bathe, shave, or wash your hair. Tylenol may be taken for mild discomfort.  Call your doctor if you experience significant pain, nausea, or vomiting, fever > 101 or other signs of infection. [phone] or [phone] Specific instructions:  Follow-up Information    Birder Robson, MD Follow up on 09/29/2017.   Specialty:  Ophthalmology Why:  11:00 Contact information: [address] [place] [address] [phone]

## 2017-09-28 NOTE — Transfer of Care (Signed)
Immediate Anesthesia Transfer of Care Note  Patient: Danielle Steele  Procedure(s) Performed: CATARACT EXTRACTION PHACO AND INTRAOCULAR LENS PLACEMENT (I[facility]) (Right Eye)  Patient Location: PACU  Anesthesia Type:MAC  Level of Consciousness: awake  Airway & Oxygen Therapy: Patient Spontanous Breathing  Post-op Assessment: Report given to RN  Post vital signs: Reviewed and stable  Last Vitals:  Vitals:   09/28/17 0640  BP: (!) 92/34  Pulse: 65  Resp: 16  Temp: 36.6 C  SpO2: 99%    Last Pain:  Vitals:   09/28/17 0640  TempSrc: Tympanic         Complications: No apparent anesthesia complications

## 2017-09-29 ENCOUNTER — Encounter: Payer: Self-pay | Admitting: Ophthalmology

## 2020-05-30 ENCOUNTER — Other Ambulatory Visit: Payer: Self-pay

## 2020-05-30 ENCOUNTER — Emergency Department: Payer: Medicare Other

## 2020-05-30 ENCOUNTER — Emergency Department
Admission: EM | Admit: 2020-05-30 | Discharge: 2020-05-30 | Disposition: A | Discharge status: Home / Self Care | Payer: Medicare Other | Attending: Emergency Medicine | Admitting: Emergency Medicine

## 2020-05-30 DIAGNOSIS — Z79899 Other long term (current) drug therapy: Secondary | ICD-10-CM | POA: Diagnosis not present

## 2020-05-30 DIAGNOSIS — I10 Essential (primary) hypertension: Secondary | ICD-10-CM | POA: Diagnosis not present

## 2020-05-30 DIAGNOSIS — S8991XA Unspecified injury of right lower leg, initial encounter: Secondary | ICD-10-CM | POA: Diagnosis present

## 2020-05-30 DIAGNOSIS — M25562 Pain in left knee: Secondary | ICD-10-CM | POA: Insufficient documentation

## 2020-05-30 DIAGNOSIS — Z87891 Personal history of nicotine dependence: Secondary | ICD-10-CM | POA: Insufficient documentation

## 2020-05-30 DIAGNOSIS — W19XXXA Unspecified fall, initial encounter: Secondary | ICD-10-CM | POA: Diagnosis not present

## 2020-05-30 DIAGNOSIS — S8001XA Contusion of right knee, initial encounter: Secondary | ICD-10-CM | POA: Diagnosis not present

## 2020-05-30 IMAGING — DX DG KNEE COMPLETE 4+V*R*
4 series · 4 of 4 positions shown · non-contrast
Comparison: Contralateral knee of the same date.

CLINICAL DATA: Fall, abrasion to bilateral knees

EXAM:
RIGHT KNEE - COMPLETE 4+ VIEW

[knee ap]
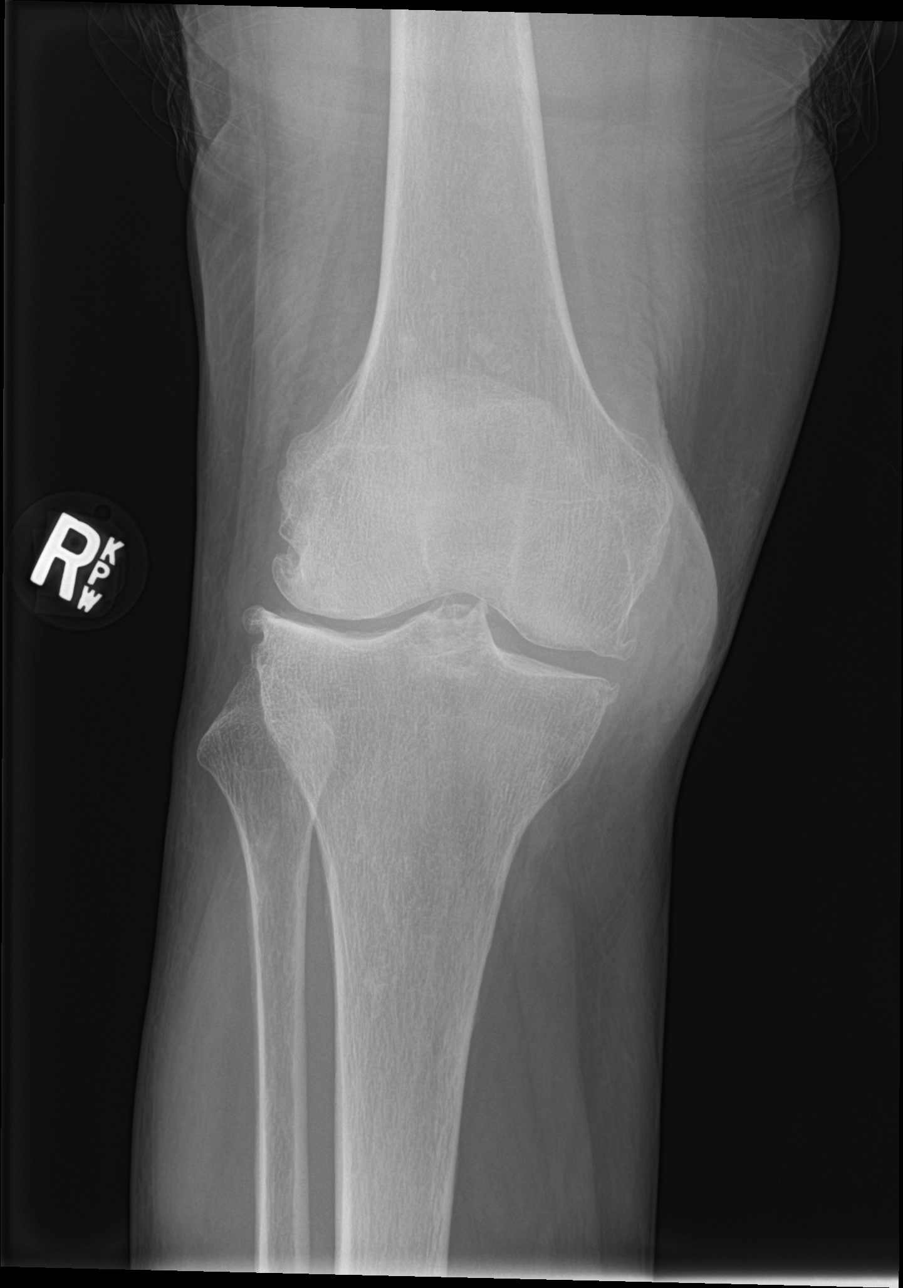

[knee lat]
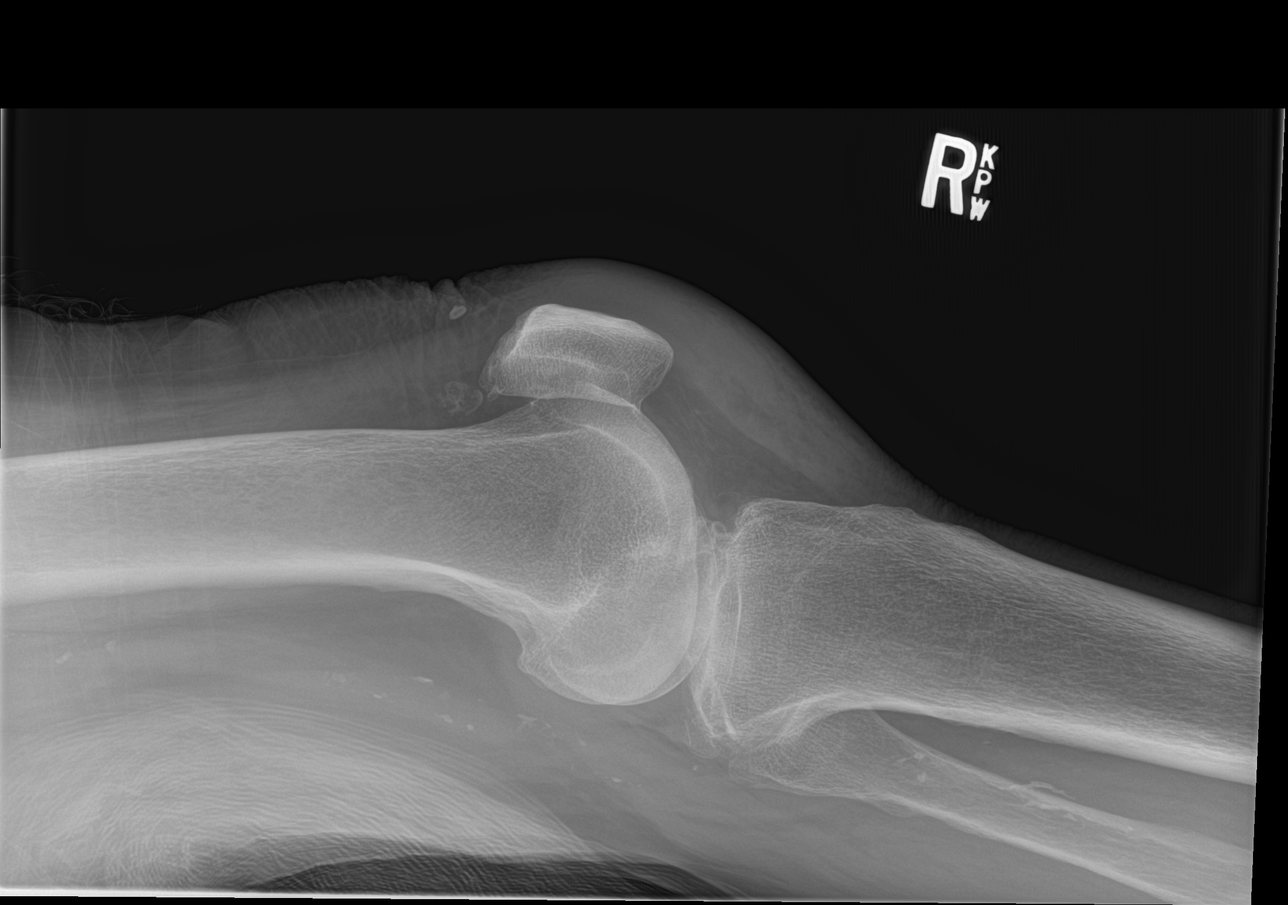

[knee obl (1 of 2)]
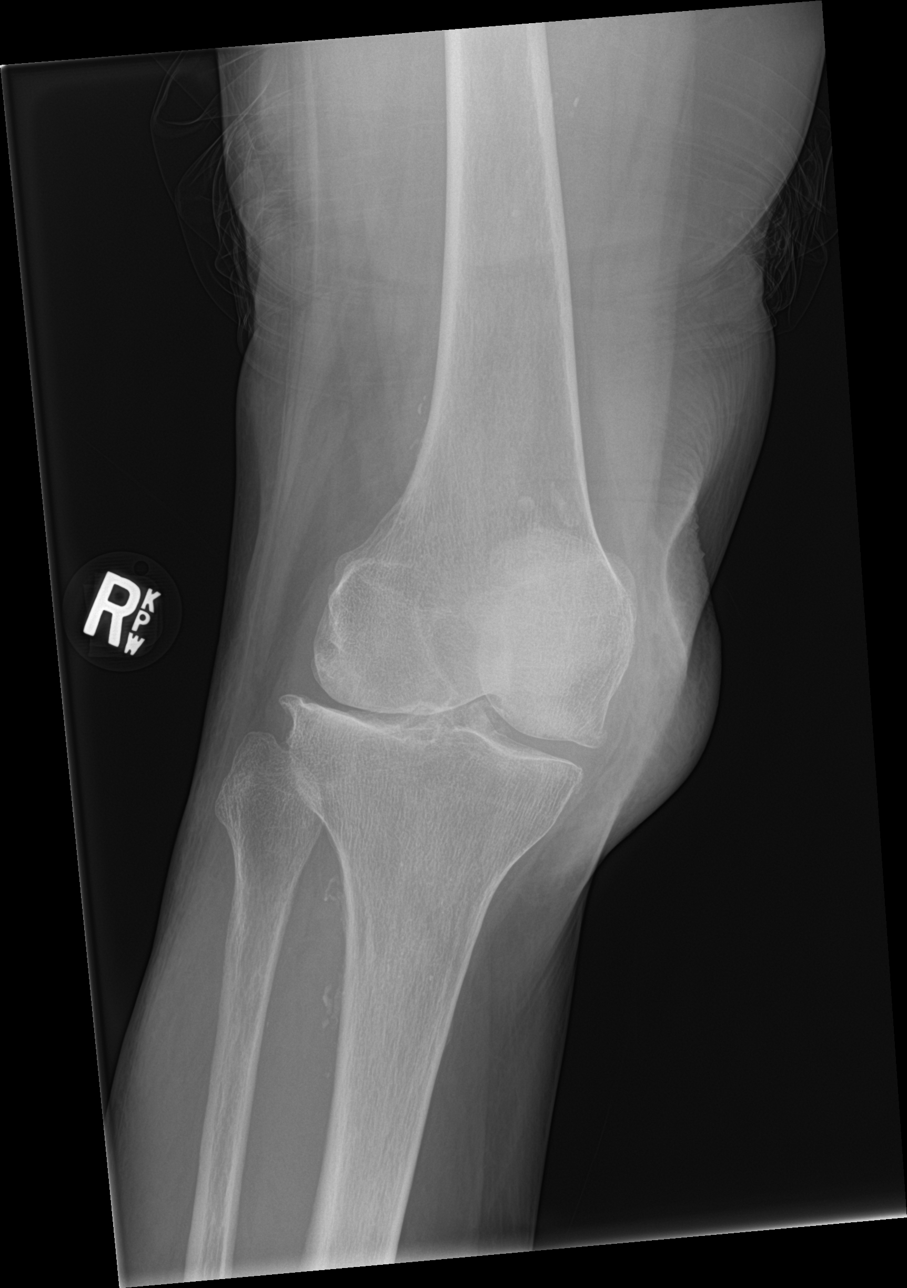

[knee obl (2 of 2)]
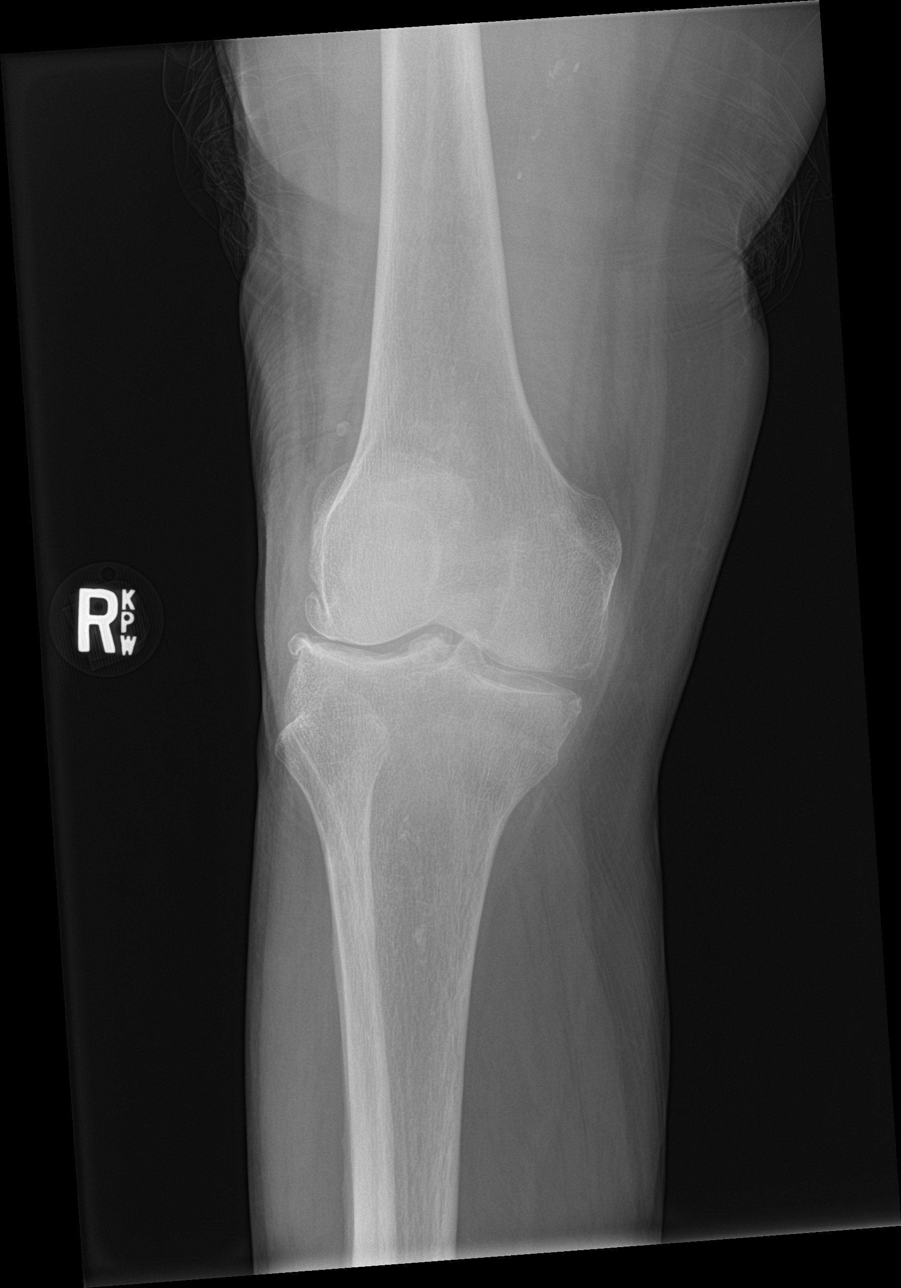

[4 of 4 positions shown; findings below may reference images not displayed]

FINDINGS: Extensive soft tissue swelling over the patella and along the
patellar tendon inferior to the patella.

Minimal patella Alta only slightly different from the other side,
ratio of 1.35 as compared to 1.25 in the contralateral knee.

Loose bodies in the joint in the suprapatellar joint space. No joint
effusion. Soft tissue swelling along the medial aspect of the RIGHT
knee. Tricompartmental osteoarthritic changes.

No fracture or dislocation.
IMPRESSION: 1. Extensive soft tissue swelling over the patella and along the
patellar tendon. No acute osseous abnormality. Injury to the
patellar tendon is considered given distribution of swelling and
asymmetry of patellar position albeit only mild. Contusion to the
anterior surface of the knee is also a possibility.
2. Loose bodies in the suprapatellar joint space with
tricompartmental osteoarthritic changes.

## 2020-05-30 IMAGING — CT CT KNEE*R* W/O CM
3 series · 15 of 33 positions shown, 18 images · non-contrast
Comparison: Plain films right knee earlier today.

CLINICAL DATA: The patient suffered a right knee injury with pain,
swelling and bruising due to a fall this morning. Question fracture.
Initial encounter.

EXAM:
CT OF THE RIGHT KNEE WITHOUT CONTRAST
TECHNIQUE: Multidetector CT imaging of the right knee was performed according
to the standard protocol. Multiplanar CT image reconstructions were
also generated.

[Series 7: axial st · axial · 0.27mm/px · z∈[+768,+897]mm · 7 of 154 slices shown, 9 images]
[im 12/154  soft-tissue]
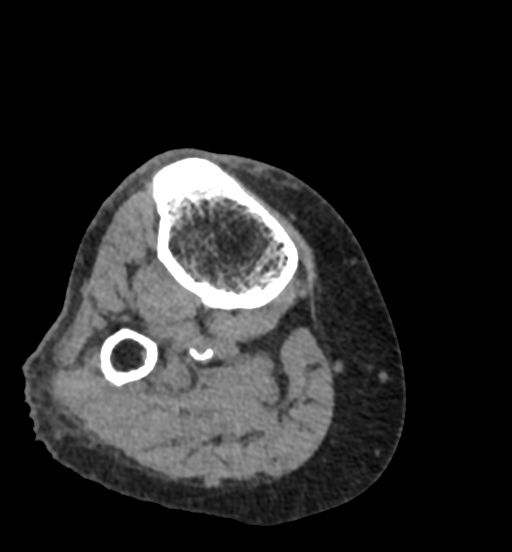
[im 12/154  bone]
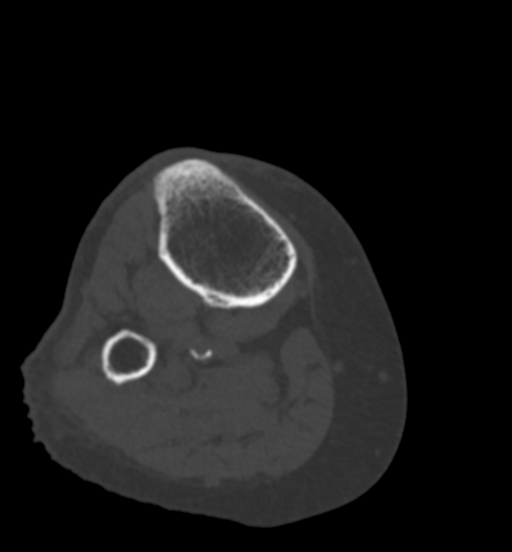
[im 36/154  bone]
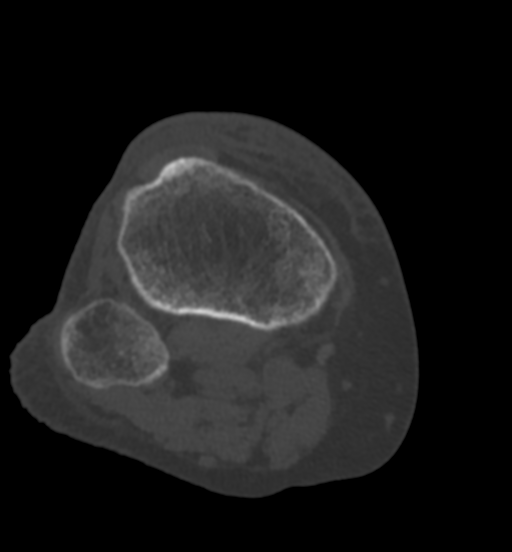
[im 59/154  bone]
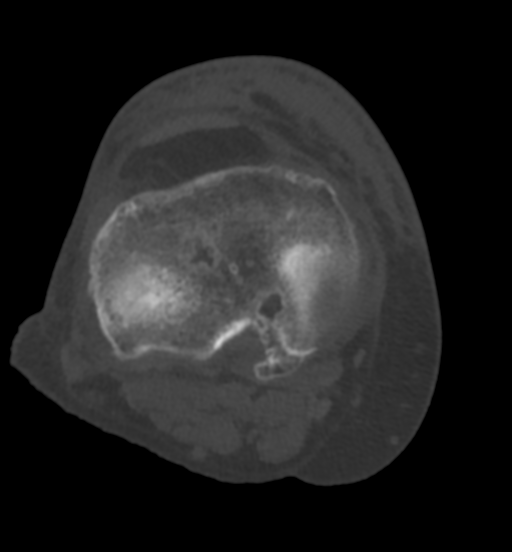
[im 83/154  bone]
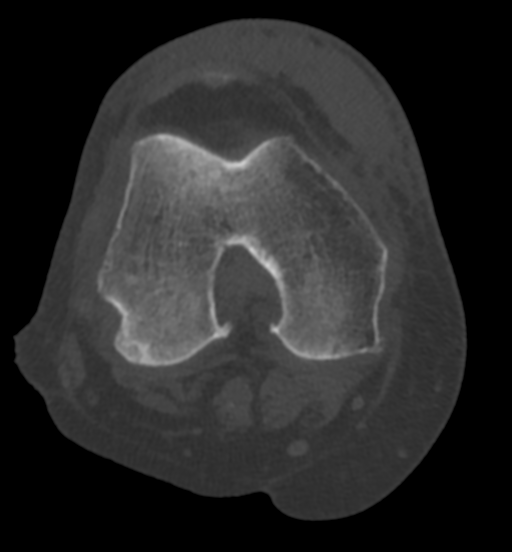
[im 95/154  soft-tissue]
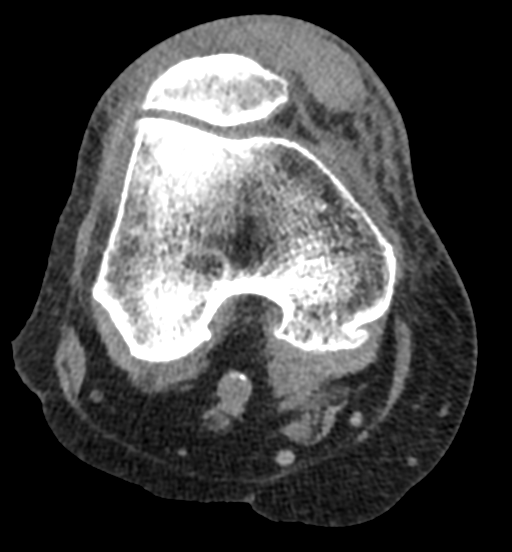
[im 95/154  bone]
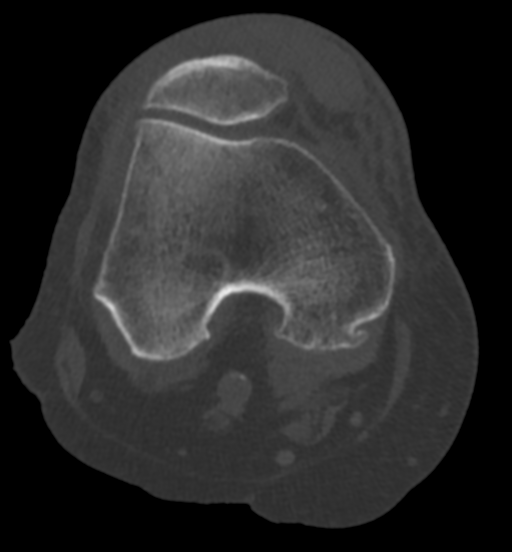
[im 118/154  bone]
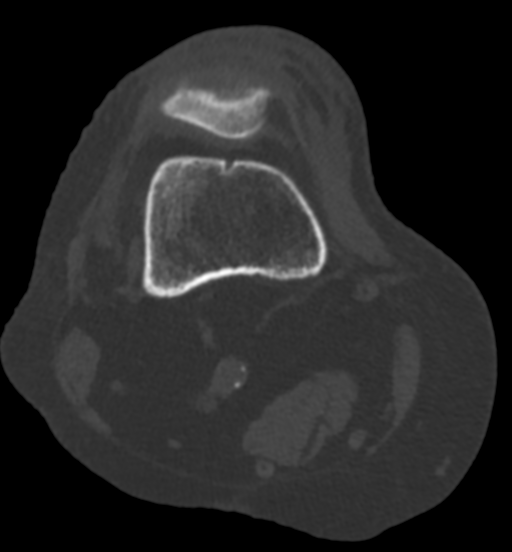
[im 142/154  bone]
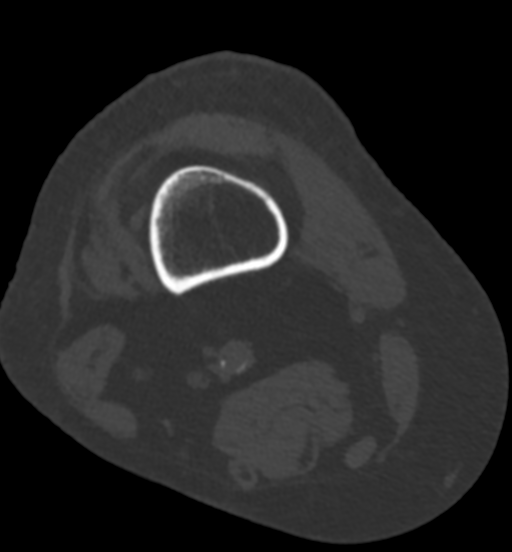

[Series 8: cor st · coronal · 0.27mm/px · 3 of 152 slices shown]
[im 31/152  bone]
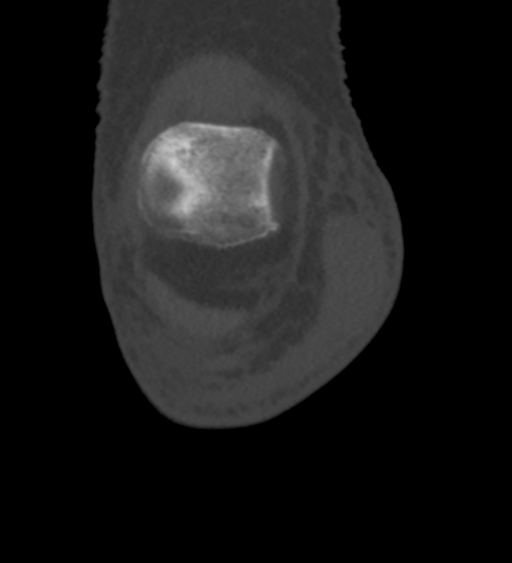
[im 61/152  bone]
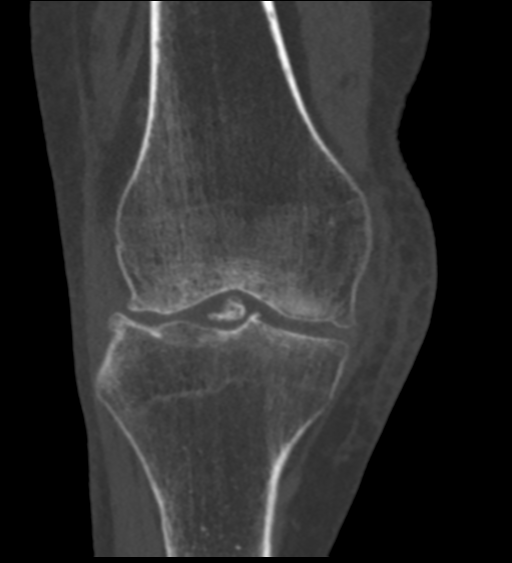
[im 91/152  bone]
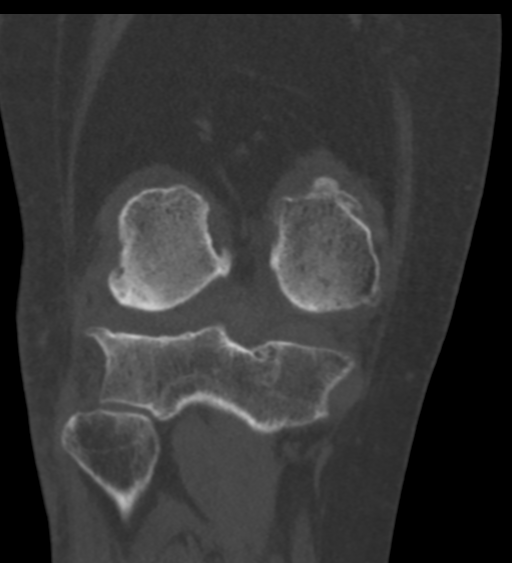

[Series 9: sag st · sagittal · 0.29mm/px · 5 of 141 slices shown, 6 images]
[im 47/141  bone]
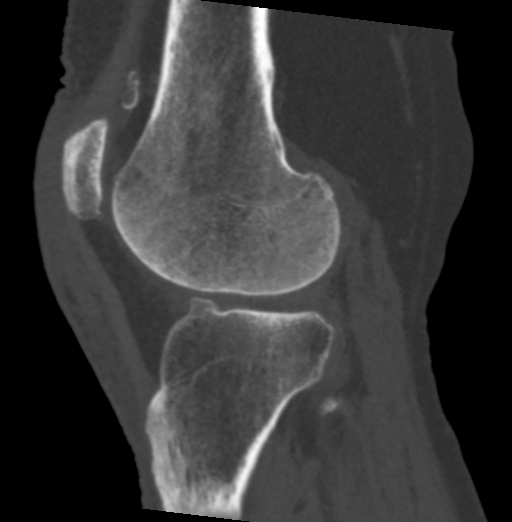
[im 59/141  bone]
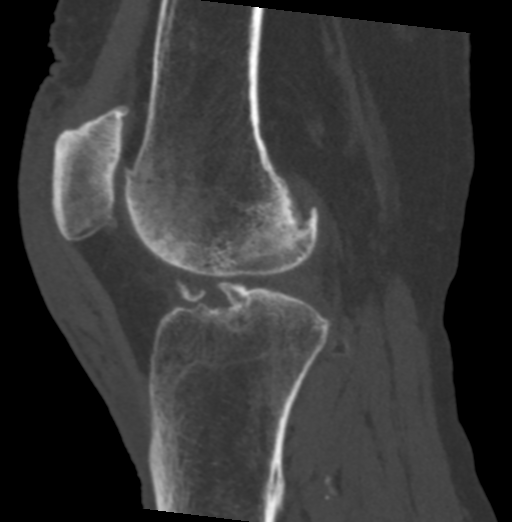
[im 71/141  soft-tissue]
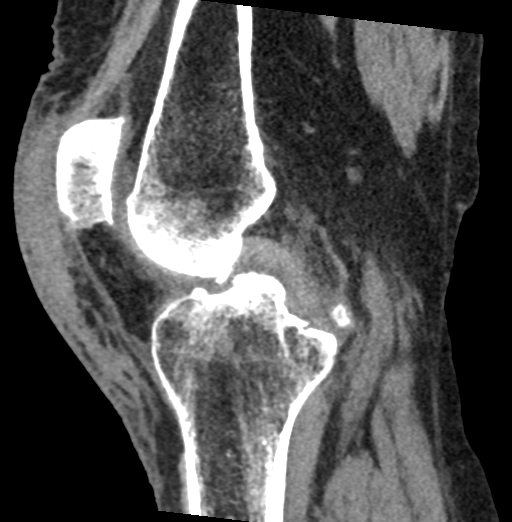
[im 71/141  bone]
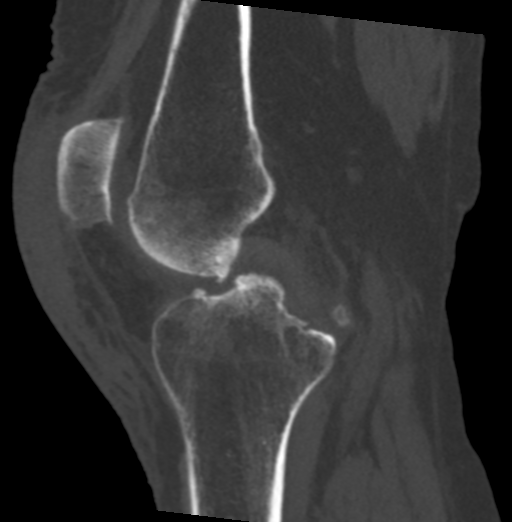
[im 82/141  bone]
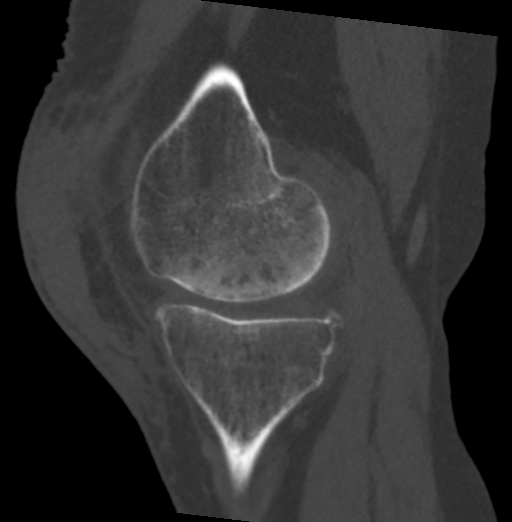
[im 94/141  bone]
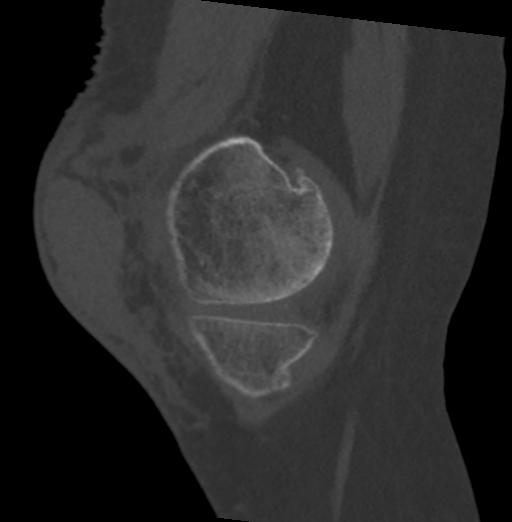

[15 of 33 positions shown; findings below may reference images not displayed]

FINDINGS: Bones/Joint/Cartilage

There is no acute bony or joint abnormality. The patient has
tricompartmental osteoarthritis with osteophytosis about the joint,
worst along the lateral joint line. A loose body in the anterior
aspect of the joint centrally measures 1.3 cm in diameter. A loose
body in the patellofemoral compartment measures 1.1 cm in diameter.
A third loose body in the posterior aspect of the joint measures
cm. Trace amount of joint fluid.

Ligaments

Suboptimally assessed by CT. The cruciate and collateral ligaments
appear intact.

Muscles and Tendons

Intact. In particular, the quadriceps and patellar tendons are
intact.

Soft tissues

Subcutaneous hematoma anterior and medial to the knee measures
approximately 1.3 cm AP x 6.5 cm transverse x 7 cm craniocaudal.
IMPRESSION: Subcutaneous hematoma anterior to the knee. No other acute
abnormality. Specifically, no fracture or tendon tear.

Tricompartmental osteoarthritis with loose bodies in the joint.

## 2020-05-30 IMAGING — DX DG KNEE COMPLETE 4+V*L*
4 series · 4 of 4 positions shown · non-contrast
Comparison: None.

CLINICAL DATA: Fall

EXAM:
LEFT KNEE - COMPLETE 4+ VIEW

[knee ap]
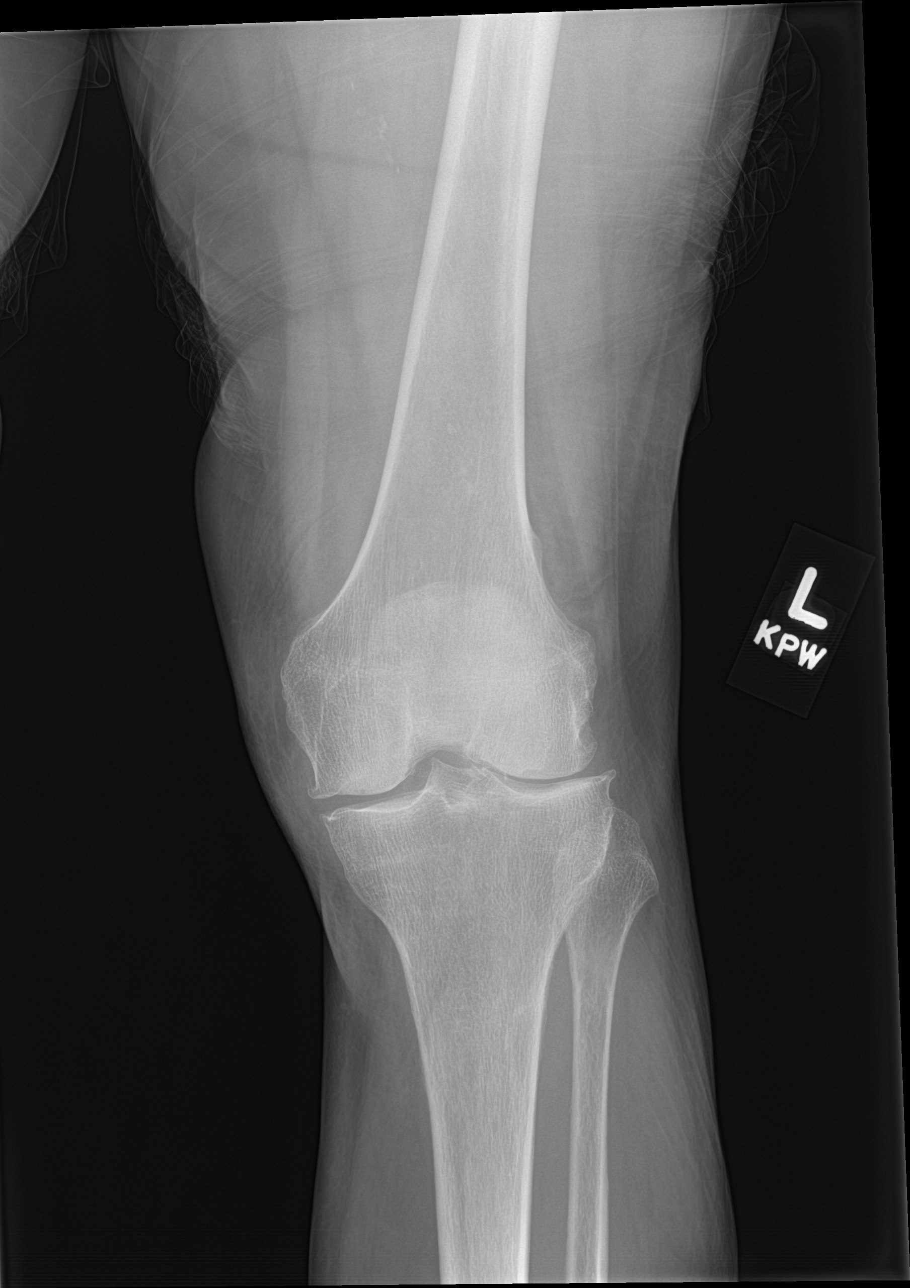

[knee lat]
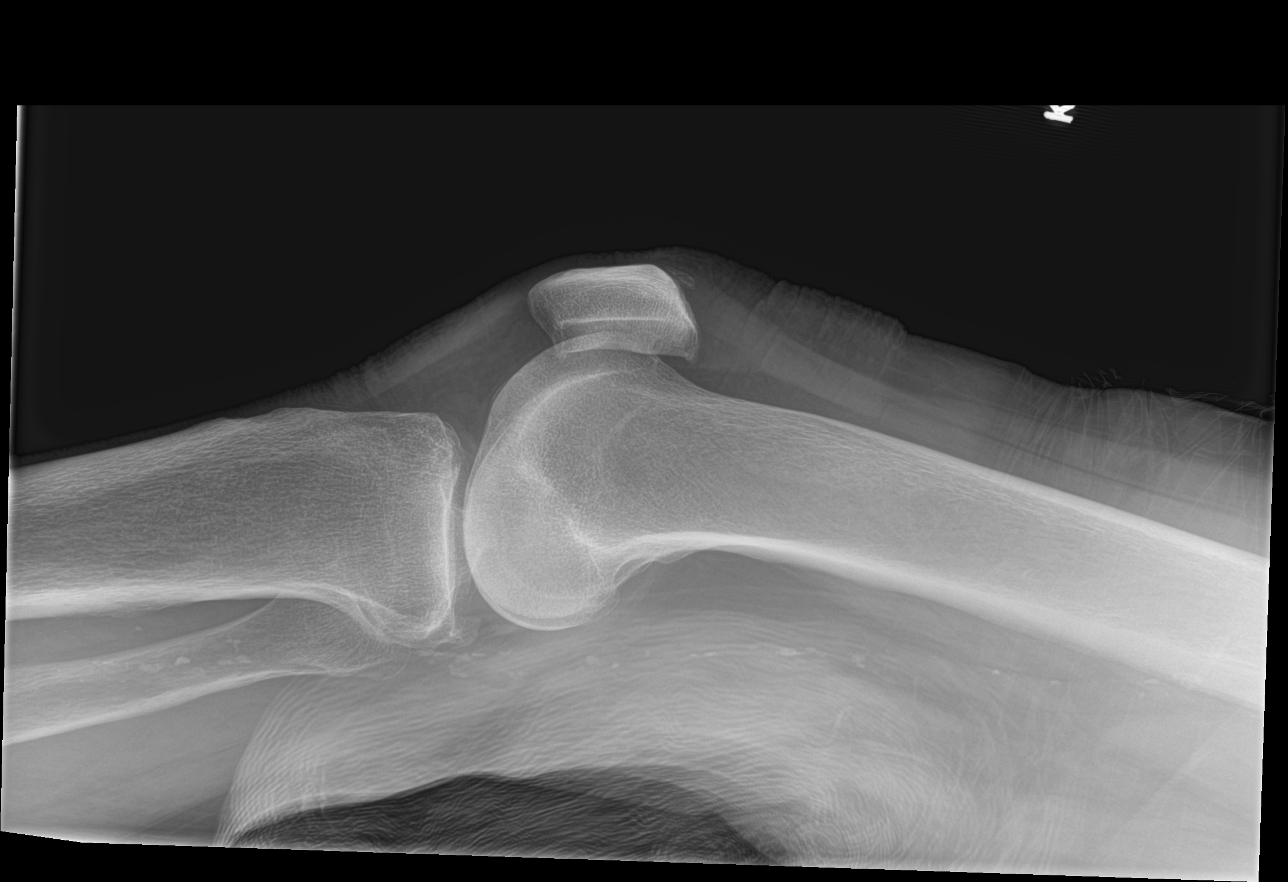

[knee obl (1 of 2)]
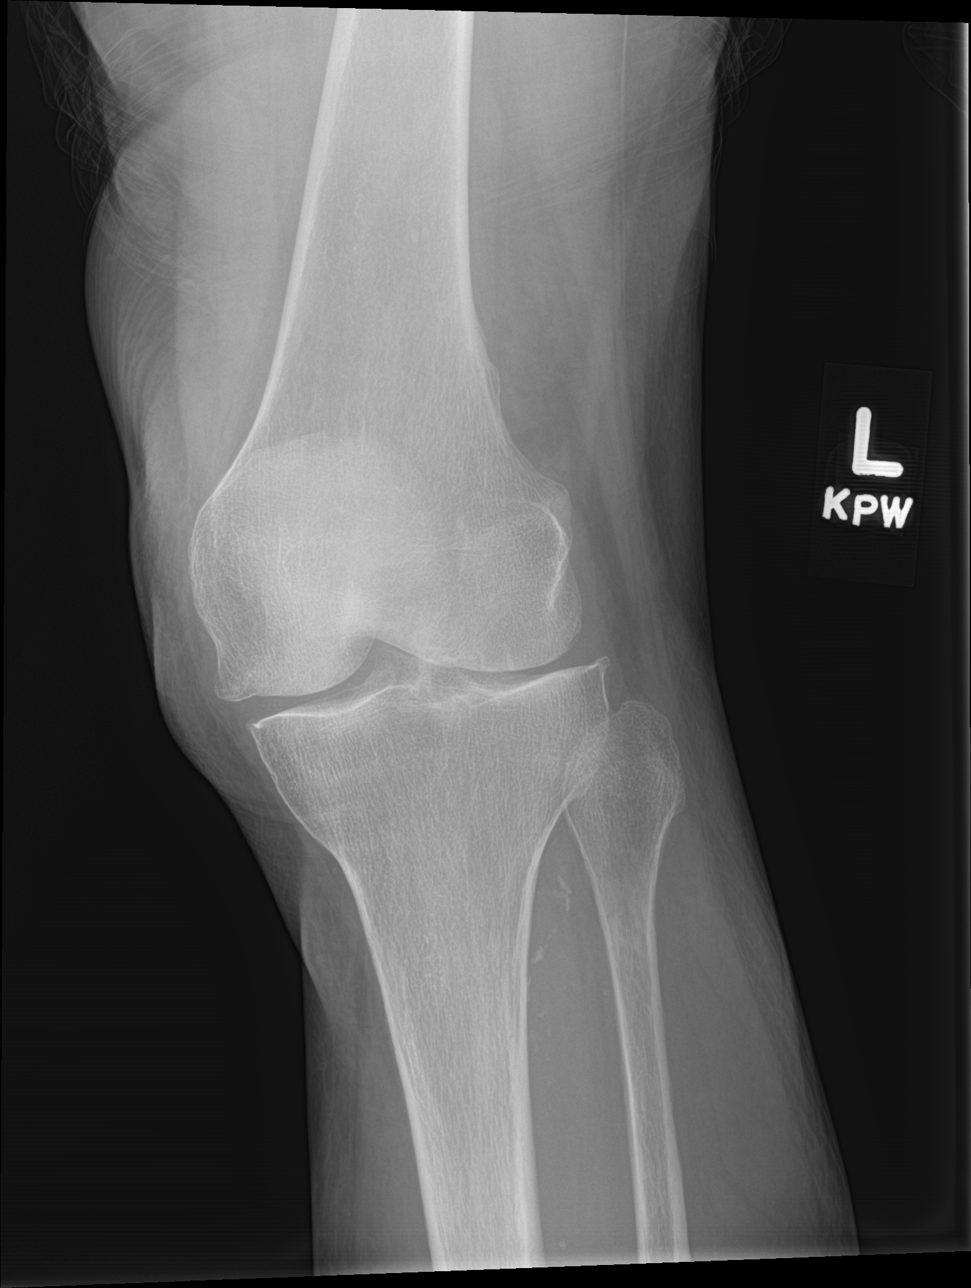

[knee obl (2 of 2)]
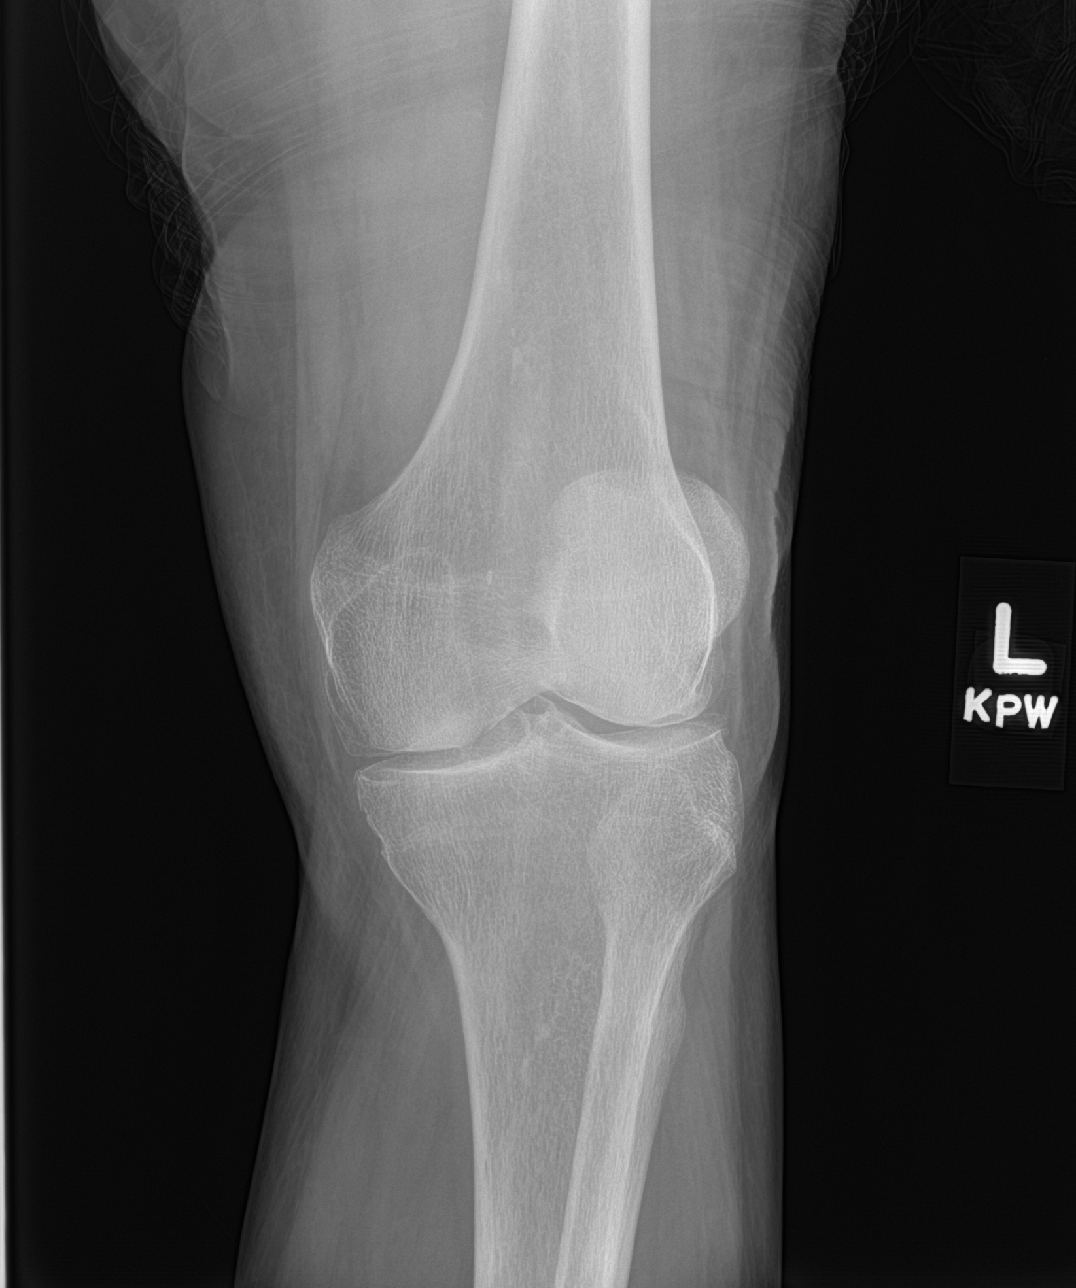

[4 of 4 positions shown; findings below may reference images not displayed]

FINDINGS: Normal alignment with approximation of the joints. No fracture or
focal osseous lesion. Tricompartmental joint space loss with
subchondral sclerosis and marginal degenerative spurring. No joint
effusion. Vascular calcifications.
IMPRESSION: 1. No acute osseous abnormality.
2. Mild tricompartmental osteoarthritis.

## 2020-05-30 MED ORDER — TRAMADOL HCL 50 MG PO TABS
50.0000 mg | ORAL_TABLET | Freq: Once | ORAL | Status: AC
  Administered 2020-05-30: 50 mg via ORAL
  Filled 2020-05-30: qty 1

## 2020-05-30 MED ORDER — LIDOCAINE 5 % EX PTCH: 1.0000 | MEDICATED_PATCH | Freq: Two times a day (BID) | CUTANEOUS | 0 refills | Status: DC

## 2020-05-30 NOTE — ED Provider Notes (Signed)
[facility] Emergency Department Provider Note   ____________________________________________   First MD Initiated Contact with Patient 05/30/20 [phone]     (approximate)  I have reviewed the triage vital signs and the nursing notes.   HISTORY  Chief Complaint Fall    HPI Danielle Steele is a 79 y.o. female patient presents with bilateral knee pain and edema right greater than left secondary to fall this morning.  Is able to ambulate with support of her walker.  History is obtained mostly from husband secondary to patient schizophrenic/delusional state.  Unable to obtain pain scale.  Obvious edema to the right knee.         Past Medical History:  Diagnosis Date  . Anxiety   . Bipolar disorder ([place])   . Depression   . GERD (gastroesophageal reflux disease)   . Hypertension   . PND (post-nasal drip)    CAUSES SOME COUGH  . Pre-diabetes   . Schizophrenia ([place])    SCHIZO  AFFECTIVE DISORDER  . Thyroid nodule   . Tremors of nervous system     There are no problems to display for this patient.   Past Surgical History:  Procedure Laterality Date  . CATARACT EXTRACTION W/PHACO Left 09/07/2017   Procedure: CATARACT EXTRACTION PHACO AND INTRAOCULAR LENS PLACEMENT (IOC);  Surgeon: Birder Robson, MD;  Location: [facility] ORS;  Service: Ophthalmology;  Laterality: Left;  [place] 00:39.9 AP% 17.4 CDE 6.94 Fluid Pack lot # [id number]  . CATARACT EXTRACTION W/PHACO Right 09/28/2017   Procedure: CATARACT EXTRACTION PHACO AND INTRAOCULAR LENS PLACEMENT (IOC);  Surgeon: Birder Robson, MD;  Location: [facility] ORS;  Service: Ophthalmology;  Laterality: Right;  [place] 01:31.8 AP% 13.7 CDE 12.55 Fluid Pack Lot # [id number]   . EYE SURGERY    . TONSILLECTOMY      Prior to Admission medications   Medication Sig Start Date End Date Taking? Authorizing Provider  acetaminophen (TYLENOL) 325 MG tablet Take 650 mg by mouth every 4 (four) hours as needed for mild pain or  moderate pain.    [provider]  atorvastatin (LIPITOR) 20 MG tablet Take 20 mg by mouth every evening.    [provider]  barrier cream (NON-SPECIFIED) CREA Apply 1 application topically as needed (apply topically after toileting as needed to prevent skin breakdown).    [provider]  benztropine (COGENTIN) 0.5 MG tablet Take 0.5 mg by mouth 2 (two) times daily.    [provider]  buPROPion (WELLBUTRIN XL) 150 MG 24 hr tablet Take 150 mg by mouth every morning.    [provider]  Camphor-Menthol (MEN-PHOR EX) Apply 1 application topically 2 (two) times daily as needed.    [provider]  camphor-menthol Timoteo Ace) lotion Apply 1 application topically 2 (two) times daily. Apply to back    [provider]  cholecalciferol (VITAMIN D) 1000 units tablet Take 2,000 Units by mouth daily.     [provider]  clonazePAM (KLONOPIN) 0.5 MG tablet Take 0.5 mg by mouth 3 (three) times daily.    [provider]  Cranberry 250 MG CAPS Take 500 mg by mouth daily.     [provider]  Difluprednate (DUREZOL) 0.05 % EMUL Apply 1 drop to eye 2 (two) times daily.    [provider]  fluPHENAZine decanoate (PROLIXIN) 25 MG/ML injection Inject 25 mg into the muscle every 30 (thirty) days.    [provider]  guaiFENesin (ROBITUSSIN) 100 MG/5ML liquid Take 300 mg by  mouth every 6 (six) hours as needed for cough.     [provider]  ketoconazole (NIZORAL) 2 % cream Apply 1 application topically 2 (two) times daily.    [provider]  lidocaine (LIDODERM) 5 % Place 1 patch onto the skin every 12 (twelve) hours. Remove & Discard patch within 12 hours or as directed by MD 05/30/20 05/30/21  Sable Feil, PA-C  lisinopril (PRINIVIL,ZESTRIL) 5 MG tablet Take 5 mg by mouth at bedtime.    [provider]  lithium carbonate 150 MG capsule Take 150 mg by mouth 2 (two) times daily with a  meal.    [provider]  loperamide (IMODIUM A-D) 2 MG tablet Take 4 mg by mouth as needed for diarrhea or loose stools (No more than 8 doses in 24 hours).     [provider]  magnesium hydroxide (MILK OF MAGNESIA) 400 MG/5ML suspension Take 30 mLs by mouth daily as needed for mild constipation.    [provider]  moxifloxacin (VIGAMOX) 0.5 % ophthalmic solution Apply 1 drop to eye 4 (four) times daily.    [provider]  saccharomyces boulardii (FLORASTOR) 250 MG capsule Take 250 mg by mouth daily.    [provider]  traZODone (DESYREL) 50 MG tablet Take 50 mg by mouth at bedtime.    [provider]    Allergies Citrus  No family history on file.  Social History Social History   Tobacco Use  . Smoking status: Former Research scientist (life sciences)  . Smokeless tobacco: Never Used  Vaping Use  . Vaping Use: Never used  Substance Use Topics  . Alcohol use: No  . Drug use: No    Review of Systems Constitutional: No fever/chills Eyes: No visual changes. ENT: No sore throat. Cardiovascular: Denies chest pain. Respiratory: Denies shortness of breath. Gastrointestinal: No abdominal pain.  No nausea, no vomiting.  No diarrhea.  No constipation. Genitourinary: Negative for dysuria. Musculoskeletal: Bilateral knee pain.   Skin: Negative for rash.  Swelling to right knee and ecchymosis. Neurological: Negative for headaches, focal weakness or numbness. Psychiatric:  Anxiety, bipolar, depression, schizophrenic. Endocrine:  Hypertension Allergic/Immunilogical: Citrus ____________________________________________   PHYSICAL EXAM:  VITAL SIGNS: ED Triage Vitals  Enc Vitals Group     BP 05/30/20 0652 124/83     Pulse Rate 05/30/20 0652 79     Resp 05/30/20 0652 20     Temp 05/30/20 0652 98.7 F (37.1 C)     Temp Source 05/30/20 0652 Oral     SpO2 05/30/20 0652 99 %     Weight 05/30/20 0653 122 lb (55.3 kg)     Height --      Head Circumference  --      Peak Flow --      Pain Score 05/30/20 0652 2     Pain Loc --      Pain Edu? --      Excl. in [place]? --     Constitutional: Alert and oriented. Well appearing and in no acute distress. Eyes: Conjunctivae are normal. PERRL. EOMI. Head: Atraumatic. Nose: No congestion/rhinnorhea. Mouth/Throat: Mucous membranes are moist.  Oropharynx non-erythematous. Neck: No stridor.  No cervical spine tenderness to palpation.**} Hematological/Lymphatic/Immunilogical: No cervical lymphadenopathy. Cardiovascular: Normal rate, regular rhythm. Grossly normal heart sounds.  Good peripheral circulation. Respiratory: Normal respiratory effort.  No retractions. Lungs CTAB. Gastrointestinal: Soft and nontender. No distention. No abdominal bruits. No CVA tenderness. Genitourinary: Deferred Musculoskeletal: No obvious deformity edema or ecchymosis of the  left knee.  Right knee with marked edema and ecchymosis. Neurologic:  Normal speech and language. No gross focal neurologic deficits are appreciated. No gait instability. Skin:  Skin is warm, dry and intact. No rash noted.  Ecchymosis right knee Psychiatric: Mood and affect are normal. Speech and behavior are normal.  ____________________________________________   LABS (all labs ordered are listed, but only abnormal results are displayed)  Labs Reviewed - No data to display ____________________________________________  EKG   ____________________________________________  RADIOLOGY I, Sable Feil, personally viewed and evaluated these images (plain radiographs) as part of my medical decision making, as well as reviewing the written report by the radiologist.  ED MD interpretation: Patient x-ray shows mild degenerative changes and loose bodies.  Suspicion for internal derangement of the knee.  Official radiology report(s): CT Knee Right Wo Contrast  Result Date: 05/30/2020 CLINICAL DATA:  The patient suffered a right knee injury with pain,  swelling and bruising due to a fall this morning. Question fracture. Initial encounter. EXAM: CT OF THE RIGHT KNEE WITHOUT CONTRAST TECHNIQUE: Multidetector CT imaging of the right knee was performed according to the standard protocol. Multiplanar CT image reconstructions were also generated. COMPARISON:  Plain films right knee earlier today. FINDINGS: Bones/Joint/Cartilage There is no acute bony or joint abnormality. The patient has tricompartmental osteoarthritis with osteophytosis about the joint, worst along the lateral joint line. A loose body in the anterior aspect of the joint centrally measures 1.3 cm in diameter. A loose body in the patellofemoral compartment measures 1.1 cm in diameter. A third loose body in the posterior aspect of the joint measures 0.6 cm. Trace amount of joint fluid. Ligaments Suboptimally assessed by CT. The cruciate and collateral ligaments appear intact. Muscles and Tendons Intact. In particular, the quadriceps and patellar tendons are intact. Soft tissues Subcutaneous hematoma anterior and medial to the knee measures approximately 1.3 cm AP x 6.5 cm transverse x 7 cm craniocaudal. IMPRESSION: Subcutaneous hematoma anterior to the knee. No other acute abnormality. Specifically, no fracture or tendon tear. 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 Electronically Signed   By: Inge Rise M.D.   On: 05/30/2020 08:53   DG Knee Complete 4 Views Left  Result Date: 05/30/2020 CLINICAL DATA:  Fall EXAM: LEFT KNEE - COMPLETE 4+ VIEW COMPARISON:  None. FINDINGS: Normal alignment with approximation of the joints. No fracture or focal osseous lesion. Tricompartmental joint space loss with subchondral sclerosis and marginal degenerative spurring. No joint effusion. Vascular calcifications. IMPRESSION: 1. No acute osseous abnormality. 2. Mild tricompartmental osteoarthritis. Electronically Signed   By: Primitivo Gauze M.D.   On: 05/30/2020 08:01   DG Knee  Complete 4 Views Right  Result Date: 05/30/2020 CLINICAL DATA:  Fall, abrasion to bilateral knees EXAM: RIGHT KNEE - COMPLETE 4+ VIEW COMPARISON:  Contralateral knee of the same date. FINDINGS: Extensive soft tissue swelling over the patella and along the patellar tendon inferior to the patella. Minimal patella Alta only slightly different from the other side, ratio of 1.35 as compared to 1.25 in the contralateral knee. Loose bodies in the joint in the suprapatellar joint space. No joint effusion. Soft tissue swelling along the medial aspect of the RIGHT knee. Tricompartmental osteoarthritic changes. No fracture or dislocation. IMPRESSION: 1. Extensive soft tissue swelling over the patella and along the patellar tendon. No acute osseous abnormality. Injury to the patellar tendon is considered given distribution of swelling and asymmetry of patellar position albeit only mild. Contusion to the anterior surface of the  knee is also a possibility. 2. Loose bodies in the suprapatellar joint space with tricompartmental osteoarthritic changes. Electronically Signed   By: Zetta Bills M.D.   On: 05/30/2020 08:08    ____________________________________________   PROCEDURES  Procedure(s) performed (including Critical Care):  Procedures   ____________________________________________   INITIAL IMPRESSION / ASSESSMENT AND PLAN / ED COURSE  As part of my medical decision making, I reviewed the following data within the B and E     Patient presents with pain edema and ecchymosis to the right knee secondary to fall.  Differential consist of knee effusion, hematoma, or internal derangement of the right knee.  Discussed x-ray CT findings with patient's husband.  Patient placed in a spiral Ace wrap and given Lidoderm for pain.  Patient follow-up with orthopedics as directed.  Advised to continue previous medication and walk with assistance.           ____________________________________________   FINAL CLINICAL IMPRESSION(S) / ED DIAGNOSES  Final diagnoses:  Contusion of right knee, initial encounter     ED Discharge Orders         Ordered    lidocaine (LIDODERM) 5 %  Every 12 hours        05/30/20 0914          *Please note:  Chandy Tarman was evaluated in Emergency Department on 05/30/2020 for the symptoms described in the history of present illness. She was evaluated in the context of the global COVID-19 pandemic, which necessitated consideration that the patient might be at risk for infection with the SARS-CoV-2 virus that causes COVID-19. Institutional protocols and algorithms that pertain to the evaluation of patients at risk for COVID-19 are in a state of rapid change based on information released by regulatory bodies including the CDC and federal and state organizations. These policies and algorithms were followed during the patient's care in the ED.  Some ED evaluations and interventions may be delayed as a result of limited staffing during and the pandemic.*   Note:  This document was prepared using Dragon voice recognition software and may include unintentional dictation errors.    Sable Feil, PA-C 05/30/20 [id number]    Carrie Mew, MD 06/03/20 [id number]

## 2020-05-30 NOTE — ED Notes (Signed)
Pt taken for CT 

## 2020-05-30 NOTE — Discharge Instructions (Signed)
Follow discharge care instructions and wear Ace wrap until evaluation by orthopedics.  May remove when sleeping.

## 2020-05-30 NOTE — ED Triage Notes (Signed)
Pt got up without assistance and fell onto knees. Swelling noted to bilat knees. Pt did get up and walk afterwards, no other complaints.

## 2020-10-10 ENCOUNTER — Emergency Department: Payer: Medicare Other

## 2020-10-10 ENCOUNTER — Emergency Department
Admission: EM | Admit: 2020-10-10 | Discharge: 2020-10-10 | Disposition: A | Discharge status: Home / Self Care | Payer: Medicare Other | Attending: Student in an Organized Health Care Education/Training Program | Admitting: Student in an Organized Health Care Education/Training Program

## 2020-10-10 DIAGNOSIS — Z79899 Other long term (current) drug therapy: Secondary | ICD-10-CM | POA: Diagnosis not present

## 2020-10-10 DIAGNOSIS — M25551 Pain in right hip: Secondary | ICD-10-CM | POA: Diagnosis present

## 2020-10-10 DIAGNOSIS — S0990XA Unspecified injury of head, initial encounter: Secondary | ICD-10-CM | POA: Insufficient documentation

## 2020-10-10 DIAGNOSIS — W06XXXA Fall from bed, initial encounter: Secondary | ICD-10-CM | POA: Insufficient documentation

## 2020-10-10 DIAGNOSIS — W19XXXA Unspecified fall, initial encounter: Secondary | ICD-10-CM

## 2020-10-10 DIAGNOSIS — I10 Essential (primary) hypertension: Secondary | ICD-10-CM | POA: Diagnosis not present

## 2020-10-10 LAB — URINALYSIS, COMPLETE (UACMP) WITH MICROSCOPIC
Bacteria, UA: NONE SEEN
Bilirubin Urine: NEGATIVE
Glucose, UA: NEGATIVE mg/dL
Ketones, ur: NEGATIVE mg/dL
Leukocytes,Ua: NEGATIVE
Nitrite: NEGATIVE
Protein, ur: NEGATIVE mg/dL
Specific Gravity, Urine: 1.003 — ABNORMAL LOW (ref 1.005–1.030)
Squamous Epithelial / LPF: NONE SEEN (ref 0–5)
pH: 7 (ref 5.0–8.0)

## 2020-10-10 LAB — CBC WITH DIFFERENTIAL/PLATELET
Abs Immature Granulocytes: 0.08 10*3/uL — ABNORMAL HIGH (ref 0.00–0.07)
Basophils Absolute: 0.1 10*3/uL (ref 0.0–0.1)
Basophils Relative: 1 %
Eosinophils Absolute: 1.4 10*3/uL — ABNORMAL HIGH (ref 0.0–0.5)
Eosinophils Relative: 10 %
HCT: 33.8 % — ABNORMAL LOW (ref 36.0–46.0)
Hemoglobin: 10.8 g/dL — ABNORMAL LOW (ref 12.0–15.0)
Immature Granulocytes: 1 %
Lymphocytes Relative: 9 %
Lymphs Abs: 1.4 10*3/uL (ref 0.7–4.0)
MCH: 31.2 pg (ref 26.0–34.0)
MCHC: 32 g/dL (ref 30.0–36.0)
MCV: 97.7 fL (ref 80.0–100.0)
Monocytes Absolute: 1.8 10*3/uL — ABNORMAL HIGH (ref 0.1–1.0)
Monocytes Relative: 12 %
Neutro Abs: 9.9 10*3/uL — ABNORMAL HIGH (ref 1.7–7.7)
Neutrophils Relative %: 67 %
Platelets: 331 10*3/uL (ref 150–400)
RBC: 3.46 MIL/uL — ABNORMAL LOW (ref 3.87–5.11)
RDW: 12.7 % (ref 11.5–15.5)
WBC: 14.6 10*3/uL — ABNORMAL HIGH (ref 4.0–10.5)
nRBC: 0 % (ref 0.0–0.2)

## 2020-10-10 LAB — COMPREHENSIVE METABOLIC PANEL
ALT: 11 U/L (ref 0–44)
AST: 11 U/L — ABNORMAL LOW (ref 15–41)
Albumin: 3.1 g/dL — ABNORMAL LOW (ref 3.5–5.0)
Alkaline Phosphatase: 84 U/L (ref 38–126)
Anion gap: 8 (ref 5–15)
BUN: 10 mg/dL (ref 8–23)
CO2: 24 mmol/L (ref 22–32)
Calcium: 9.6 mg/dL (ref 8.9–10.3)
Chloride: 104 mmol/L (ref 98–111)
Creatinine, Ser: 0.92 mg/dL (ref 0.44–1.00)
GFR, Estimated: >60 mL/min (ref >60)
Glucose, Bld: 129 mg/dL — ABNORMAL HIGH (ref 70–99)
Potassium: 3.7 mmol/L (ref 3.5–5.1)
Sodium: 136 mmol/L (ref 135–145)
Total Bilirubin: 0.6 mg/dL (ref 0.3–1.2)
Total Protein: 7 g/dL (ref 6.5–8.1)

## 2020-10-10 LAB — CK: Total CK: 79 U/L (ref 38–234)

## 2020-10-10 IMAGING — CR DG HIP (WITH OR WITHOUT PELVIS) 2-3V*R*
3 series · 3 of 3 positions shown · non-contrast
Comparison: None.

CLINICAL DATA: Fall, concern for right hip fracture

EXAM:
DG HIP (WITH OR WITHOUT PELVIS) 2-3V RIGHT

[pelvis ap]
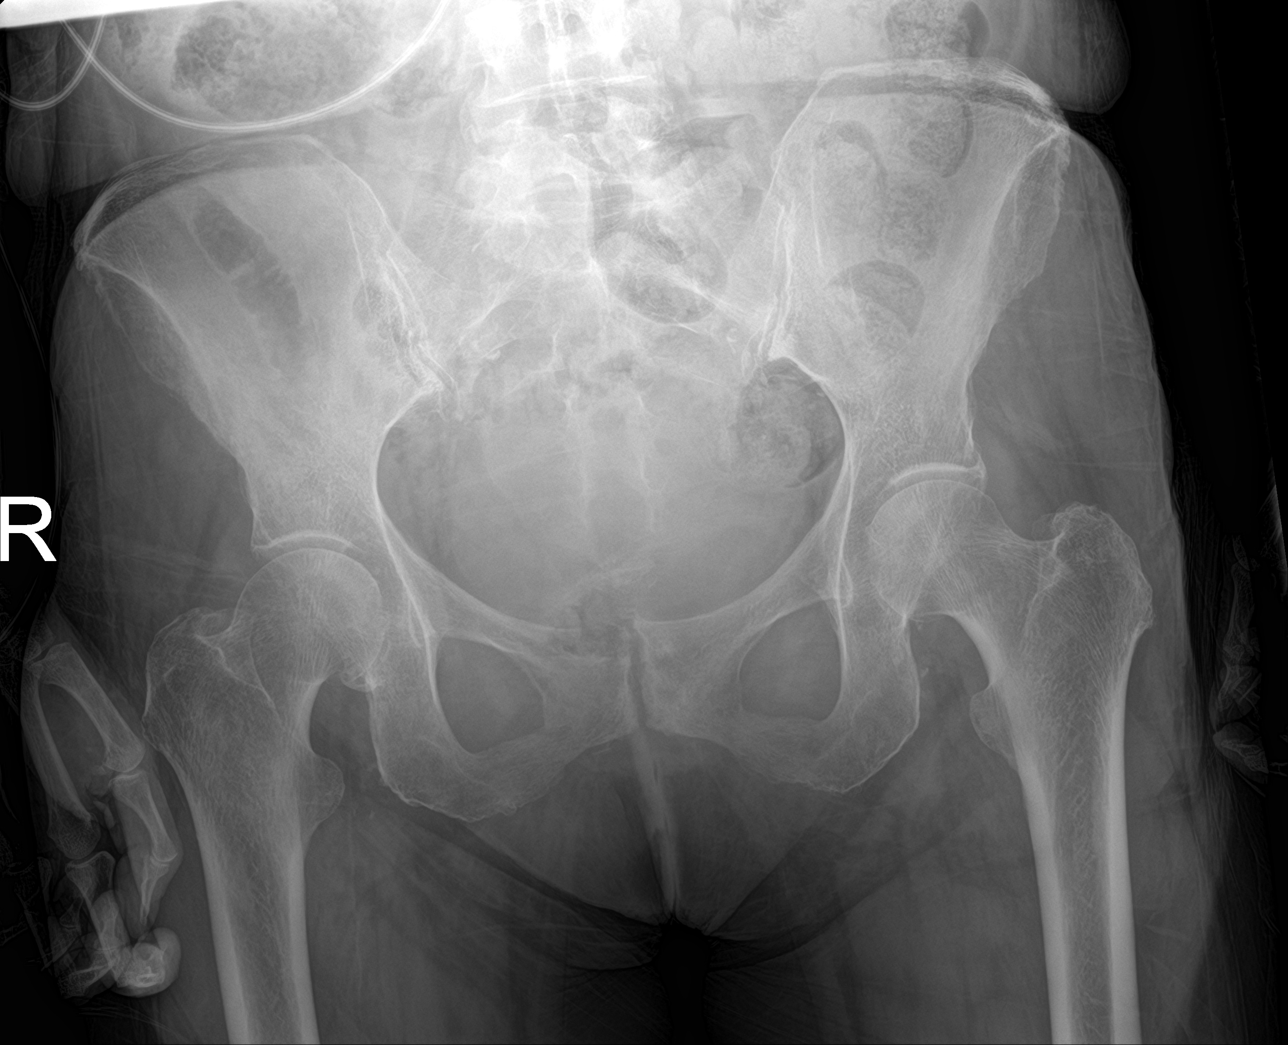

[hip ap]
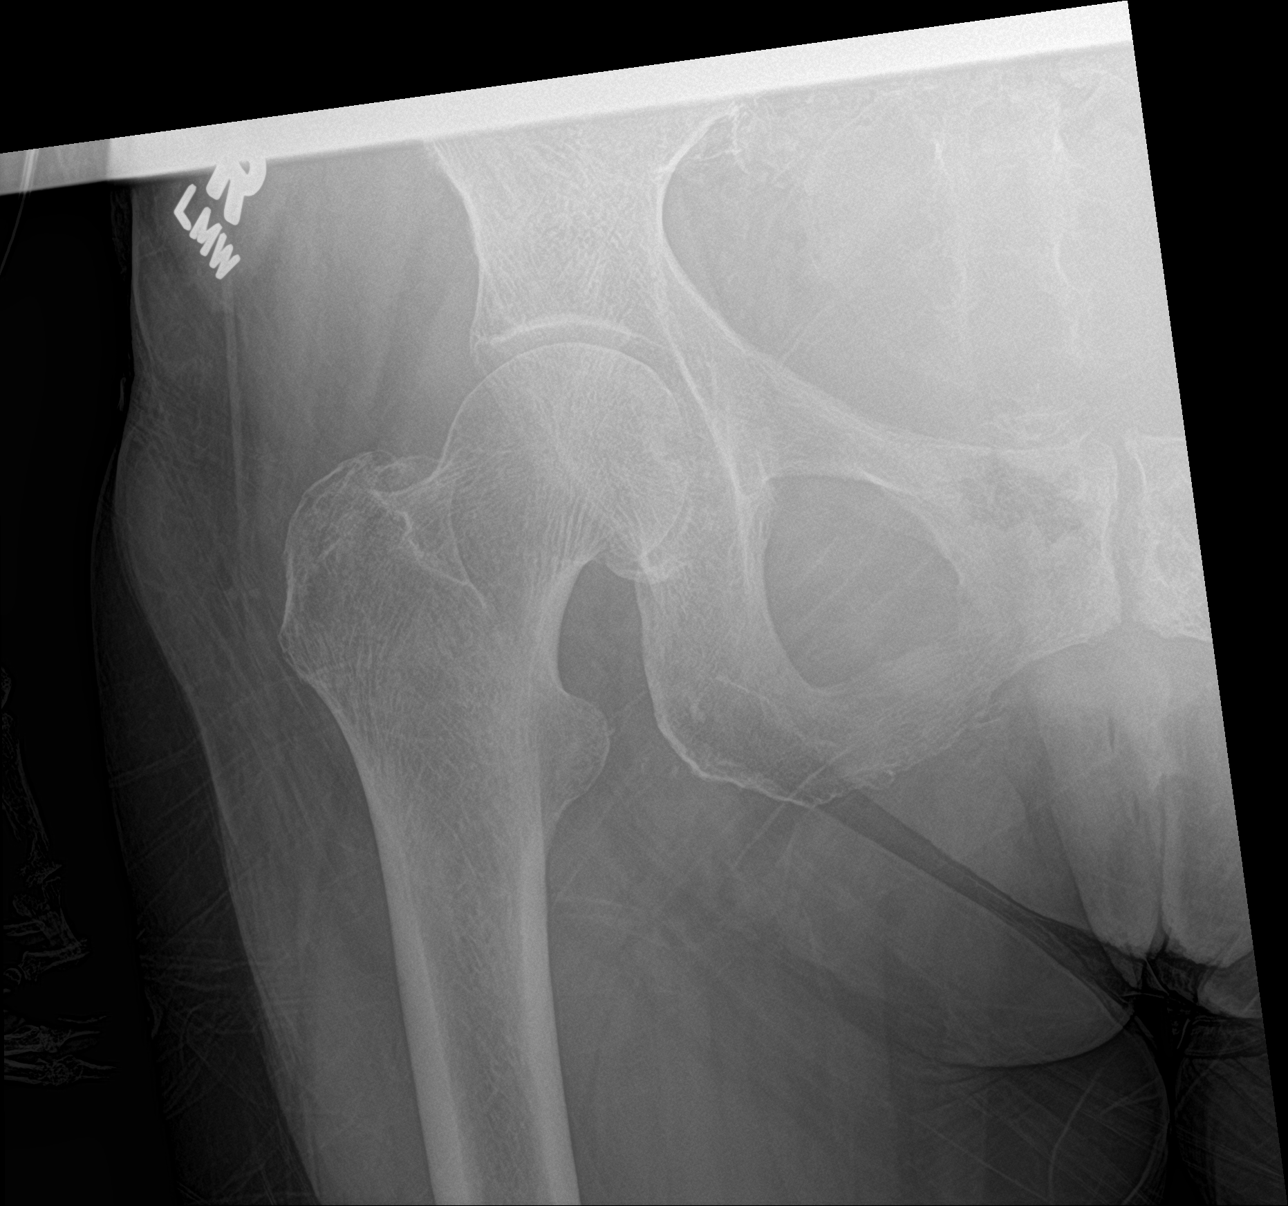

[hip lat]
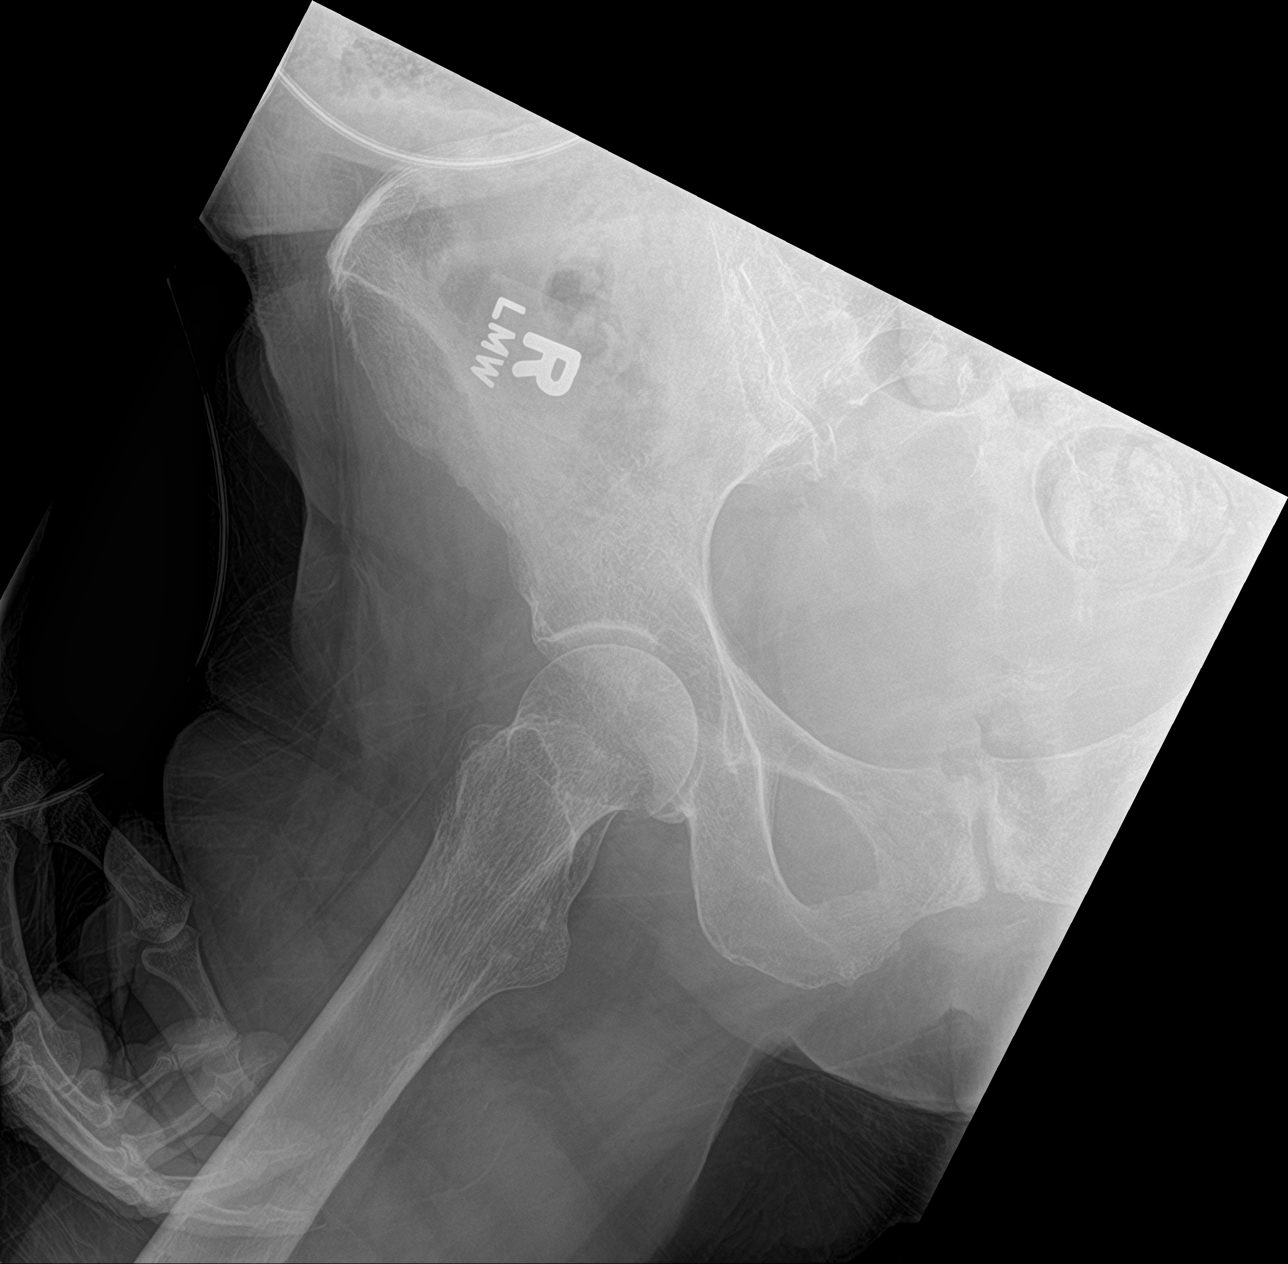

[3 of 3 positions shown; findings below may reference images not displayed]

FINDINGS: The osseous structures appear diffusely demineralized which may
limit detection of small or nondisplaced fractures. No clear
evidence of right hip fracture or traumatic malalignment. Both
femoral heads are normally located. Left femur is intact as
visualized. Remaining bones of the pelvis appear intact and
congruent as well. Degenerative changes in the lower lumbar spine,
bilateral SI joints, symphysis pubis and both hips. Enthesopathic
changes noted about the greater trochanters, iliac crests, and
ischial tuberosities as well.
IMPRESSION: 1. No clear evidence of right hip fracture or traumatic
malalignment.
2. The osseous structures appear diffusely demineralized which may
limit detection of small or nondisplaced fractures.

## 2020-10-10 IMAGING — CT CT HEAD W/O CM
3 series · 14 of 47 positions shown, 16 images · non-contrast
Comparison: None.

CLINICAL DATA: Fall

EXAM:
CT HEAD WITHOUT CONTRAST
CT CERVICAL SPINE WITHOUT CONTRAST
TECHNIQUE: Multidetector CT imaging of the head and cervical spine was
performed following the standard protocol without intravenous
contrast. Multiplanar CT image reconstructions of the cervical spine
were also generated.

[Series 3: head wo · axial · 0.43mm/px · z∈[-141,-11]mm · 8 of 32 slices shown, 10 images]
[im 3/32  brain]
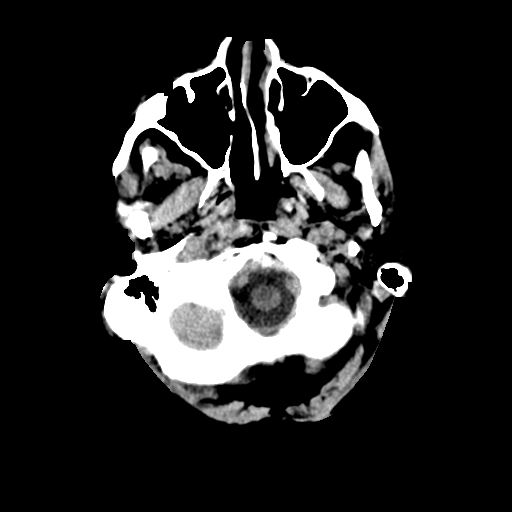
[im 3/32  bone]
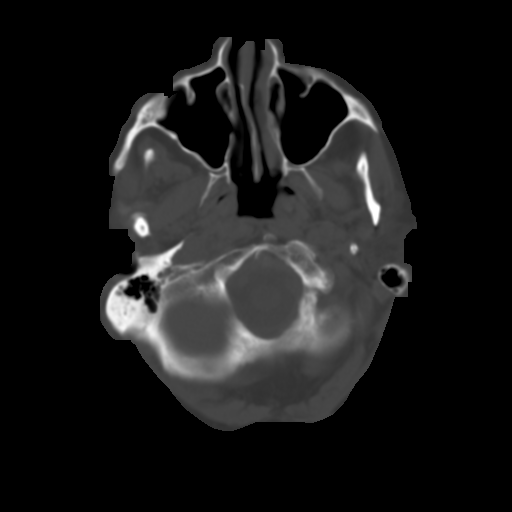
[im 7/32  brain]
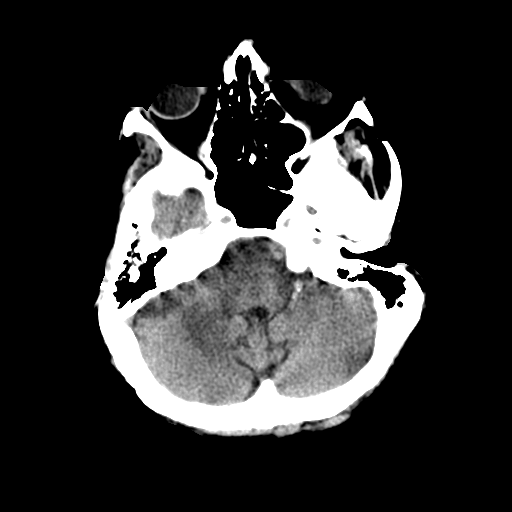
[im 10/32  brain]
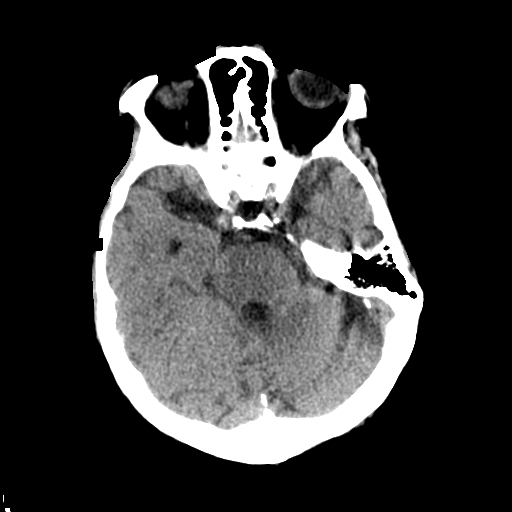
[im 14/32  brain]
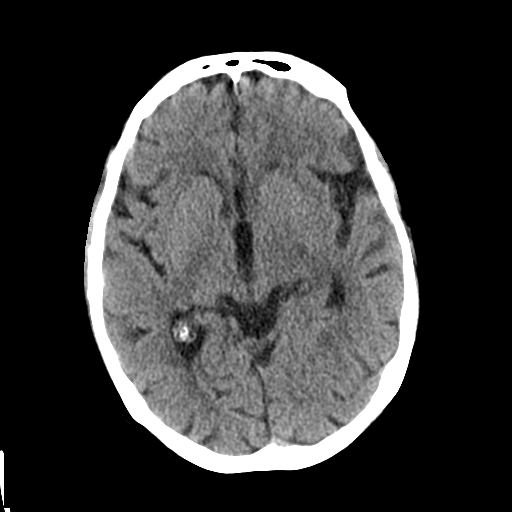
[im 18/32  brain]
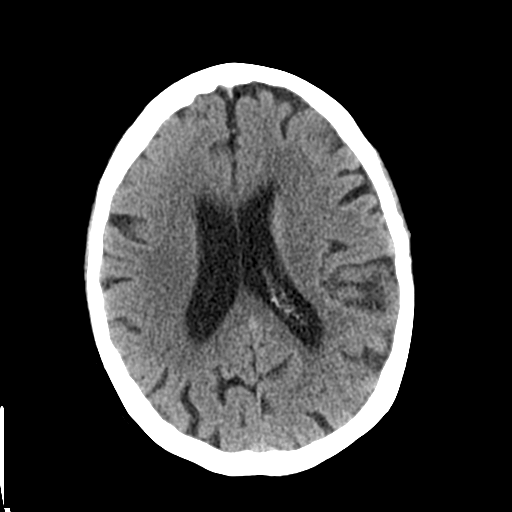
[im 18/32  bone]
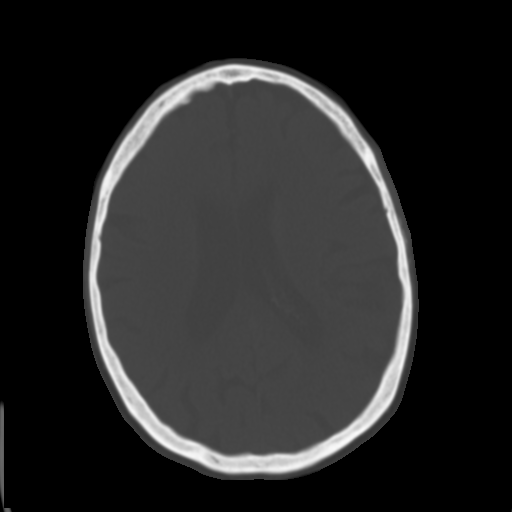
[im 22/32  brain]
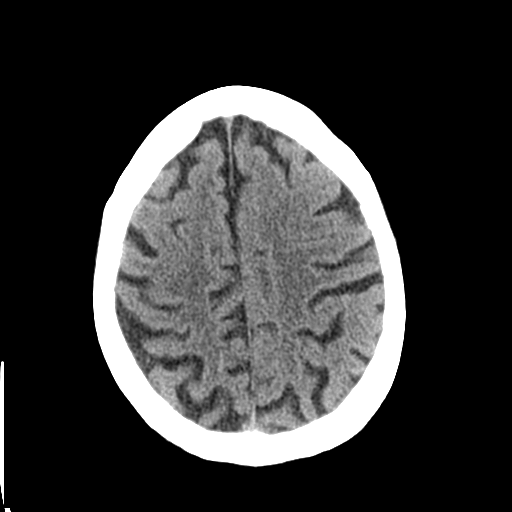
[im 25/32  brain]
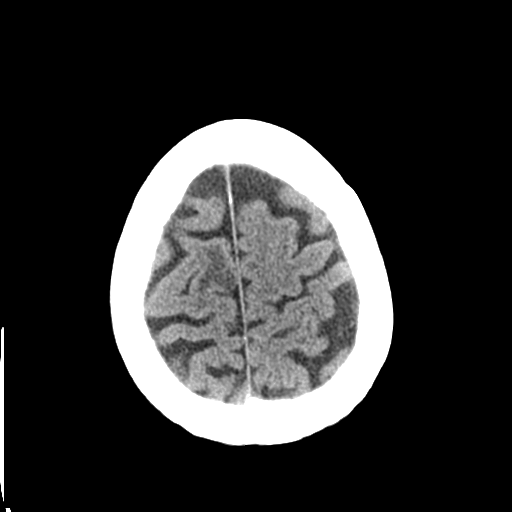
[im 29/32  brain]
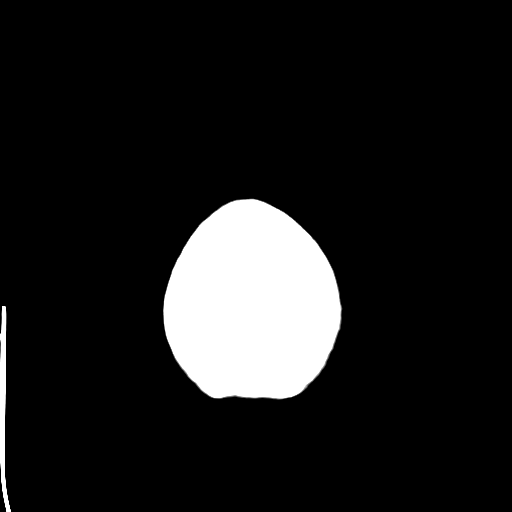

[Series 4: coronal soft tissue · coronal · 0.29mm/px · 3 of 67 slices shown]
[im 23/67  brain]
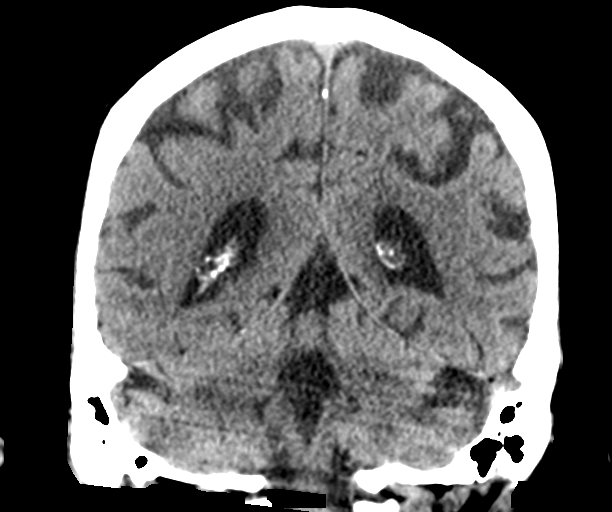
[im 30/67  brain]
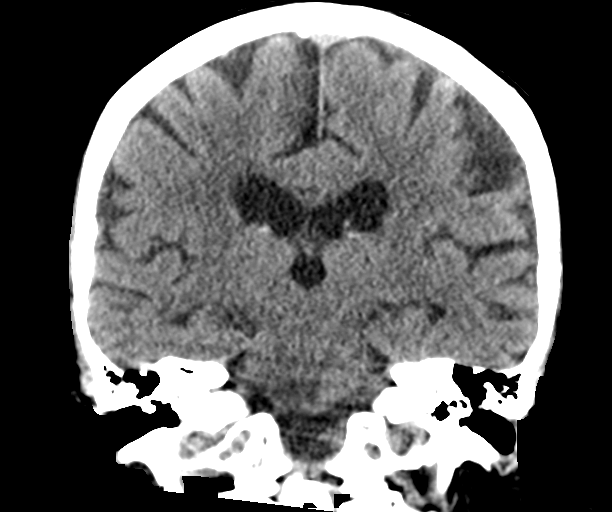
[im 37/67  brain]
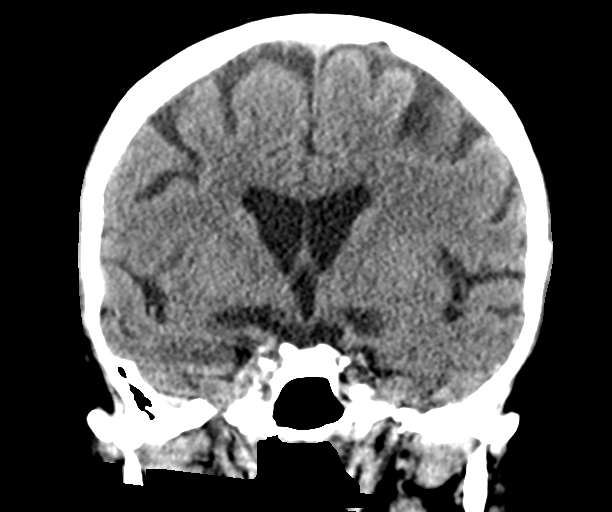

[Series 5: sagittal soft tissue · sagittal · 0.29mm/px · 3 of 59 slices shown]
[im 21/59  brain]
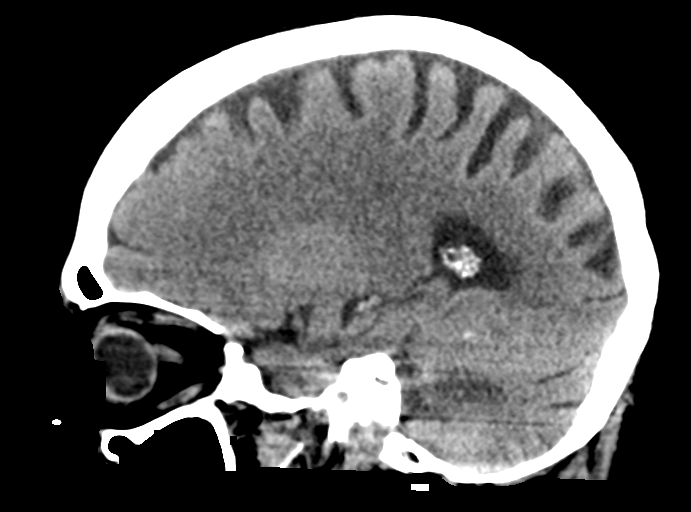
[im 30/59  brain]
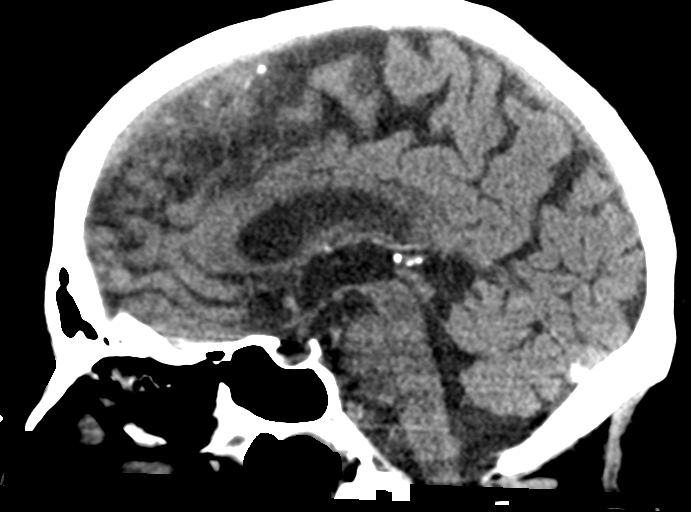
[im 38/59  brain]
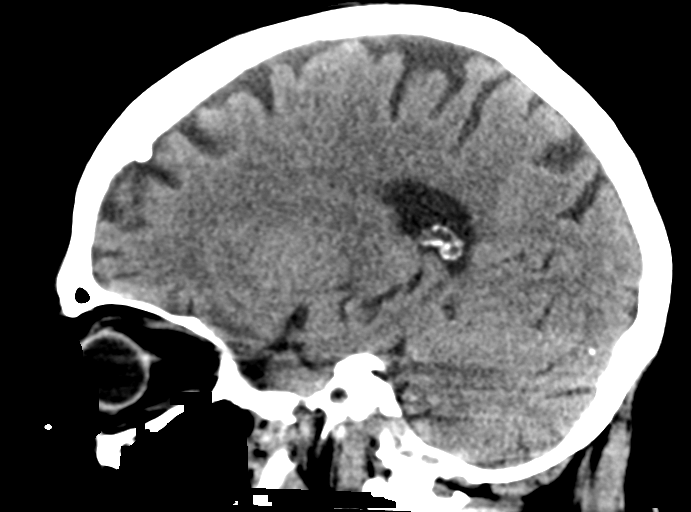

[14 of 47 positions shown; findings below may reference images not displayed]

FINDINGS: CT HEAD FINDINGS

Brain: There is no mass, hemorrhage or extra-axial collection. The
size and configuration of the ventricles and extra-axial CSF spaces
are normal. There is hypoattenuation of the periventricular white
matter, most commonly indicating chronic ischemic microangiopathy.

Vascular: Atherosclerotic calcification of the internal carotid
arteries at the skull base. No abnormal hyperdensity of the major
intracranial arteries or dural venous sinuses.

Skull: The visualized skull base, calvarium and extracranial soft
tissues are normal.

Sinuses/Orbits: No fluid levels or advanced mucosal thickening of
the visualized paranasal sinuses. No mastoid or middle ear effusion.
The orbits are normal.

CT CERVICAL SPINE FINDINGS

Alignment: No static subluxation. Facets are aligned. Occipital
condyles are normally positioned.

Skull base and vertebrae: No acute fracture.

Soft tissues and spinal canal: No prevertebral fluid or swelling. No
visible canal hematoma.

Disc levels: No advanced spinal canal or neural foraminal stenosis.

Upper chest: No pneumothorax, pulmonary nodule or pleural effusion.

Other: Normal visualized paraspinal cervical soft tissues.
IMPRESSION: 1. Chronic ischemic microangiopathy without acute intracranial
abnormality.
2. No acute fracture or static subluxation of the cervical spine.

## 2020-10-10 IMAGING — CT CT CERVICAL SPINE W/O CM
3 of 4 series · 13 of 34 positions shown, 16 images · non-contrast
Comparison: None.

CLINICAL DATA: Fall

EXAM:
CT HEAD WITHOUT CONTRAST
CT CERVICAL SPINE WITHOUT CONTRAST
TECHNIQUE: Multidetector CT imaging of the head and cervical spine was
performed following the standard protocol without intravenous
contrast. Multiplanar CT image reconstructions of the cervical spine
were also generated.

[Series 4: sagittal bone · sagittal · 0.24mm/px · 5 of 69 slices shown, 6 images]
[im 23/69  bone]
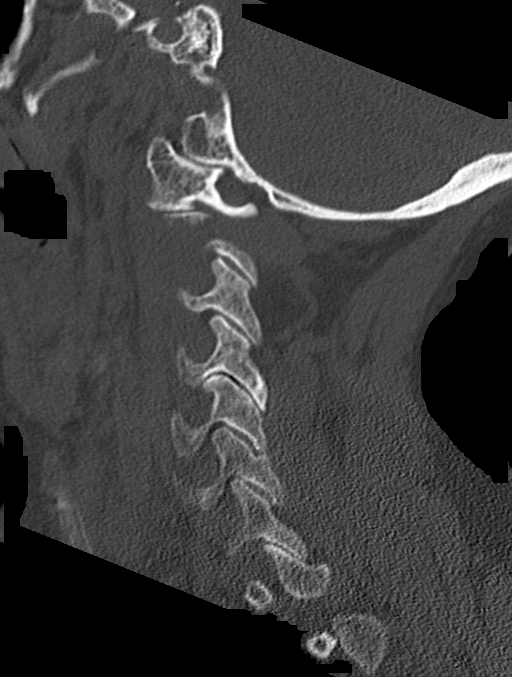
[im 29/69  bone]
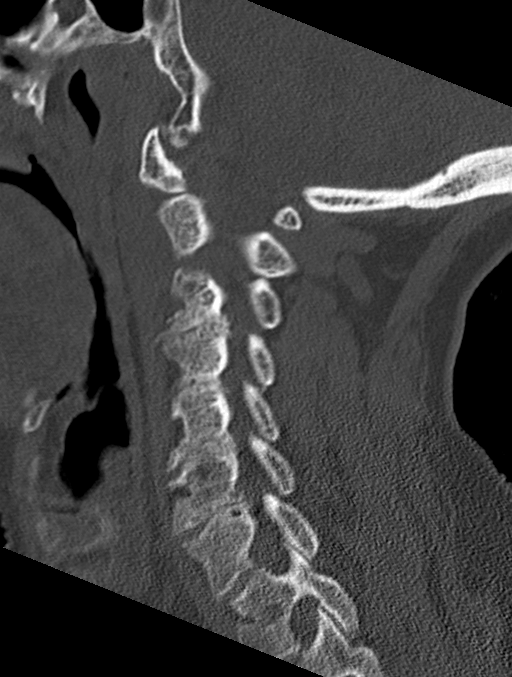
[im 35/69  soft-tissue]
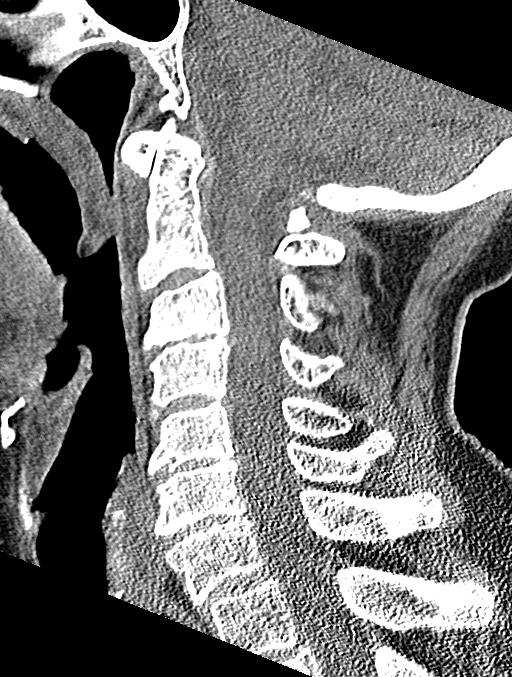
[im 35/69  bone]
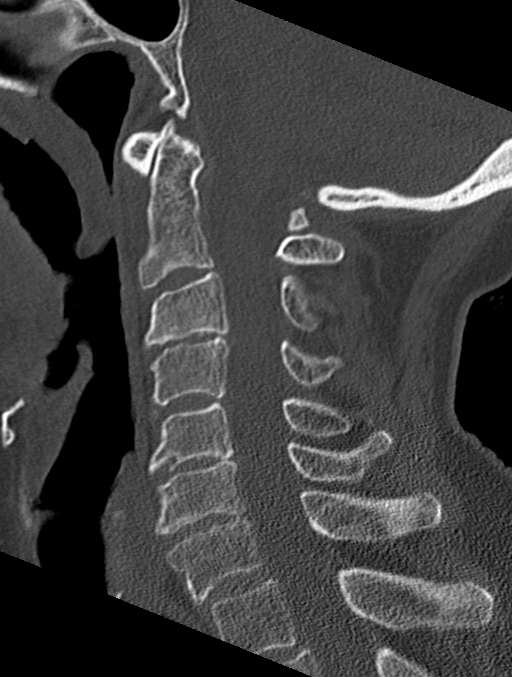
[im 40/69  bone]
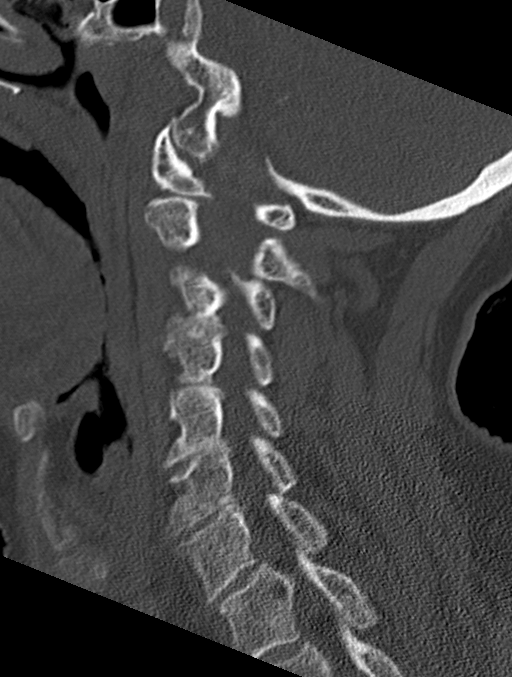
[im 46/69  bone]
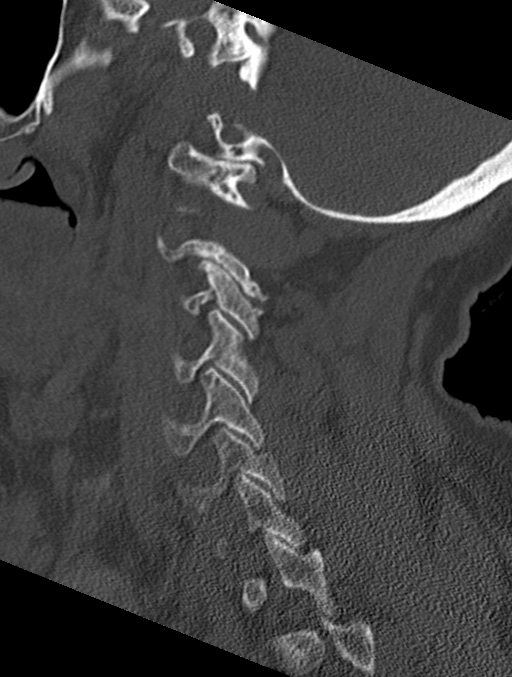

[Series 5: coronal bone · coronal · 0.25mm/px · 3 of 63 slices shown]
[im 17/63  bone]
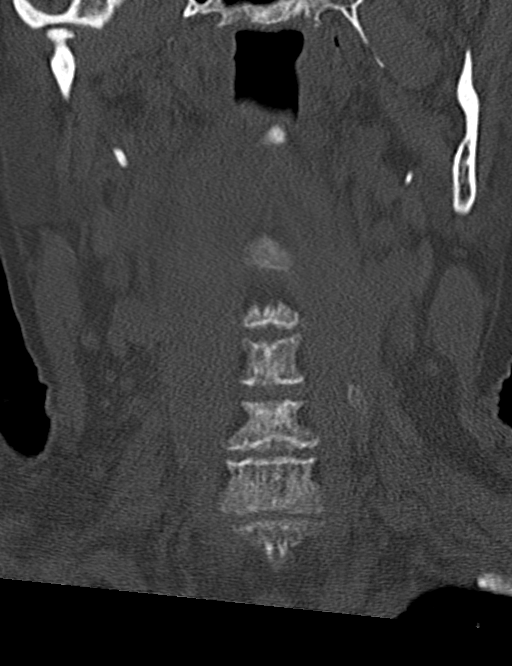
[im 27/63  bone]
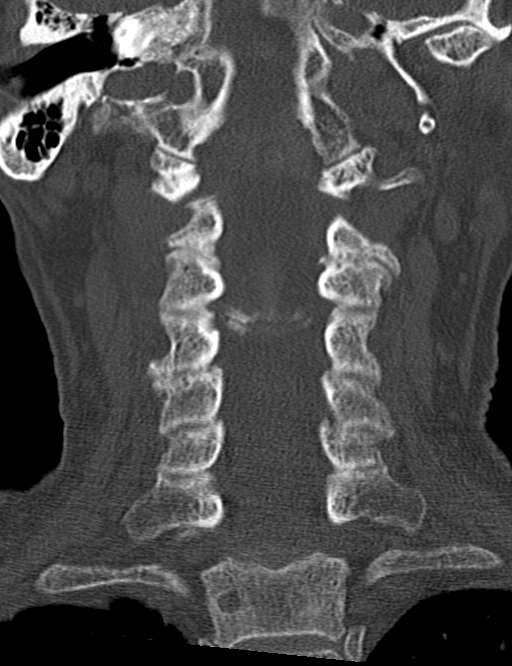
[im 37/63  bone]
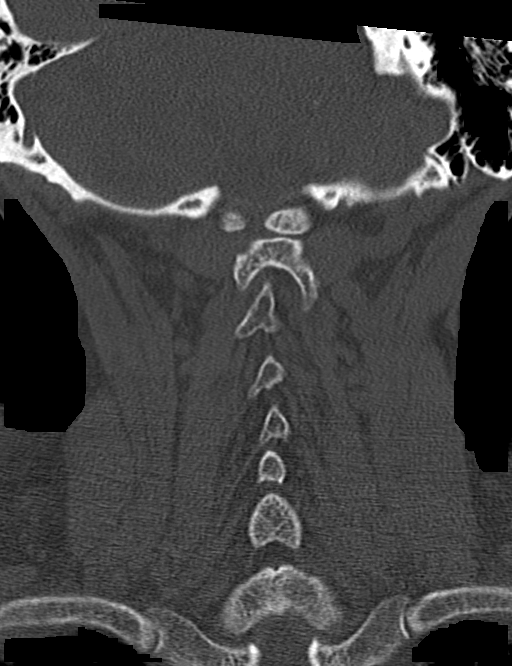

[Series 6: orthogonal bone · axial · 0.24mm/px · z∈[-285,-193]mm · 5 of 82 slices shown, 7 images]
[im 14/82  soft-tissue]
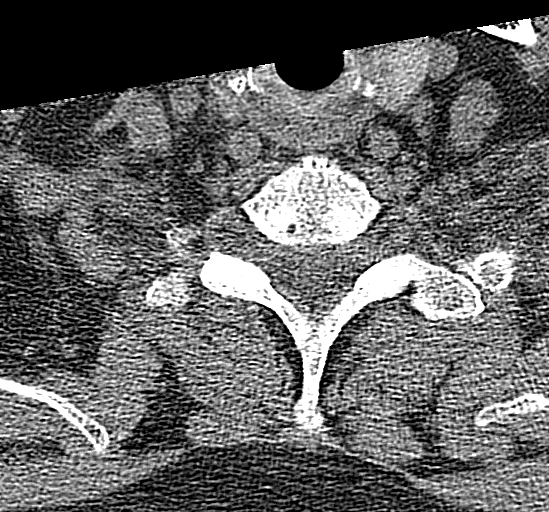
[im 14/82  bone]
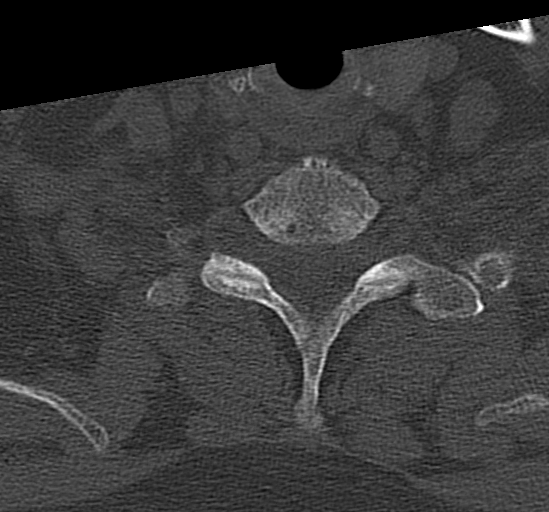
[im 28/82  bone]
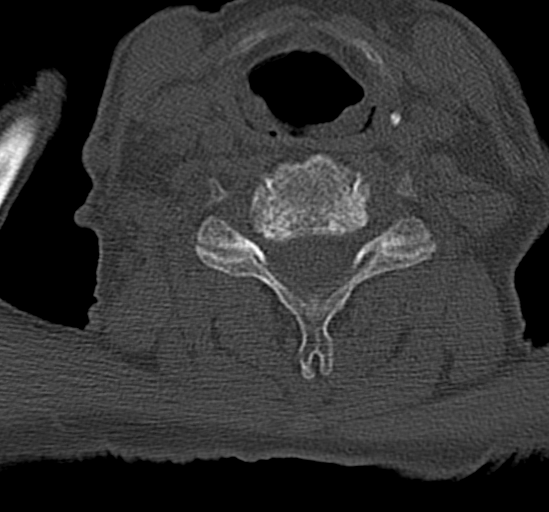
[im 41/82  bone]
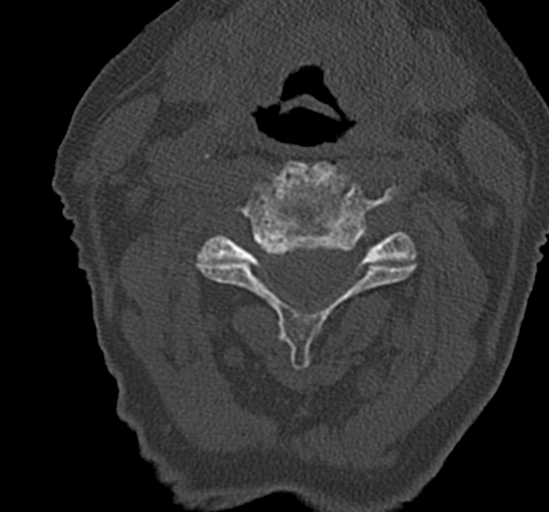
[im 55/82  bone]
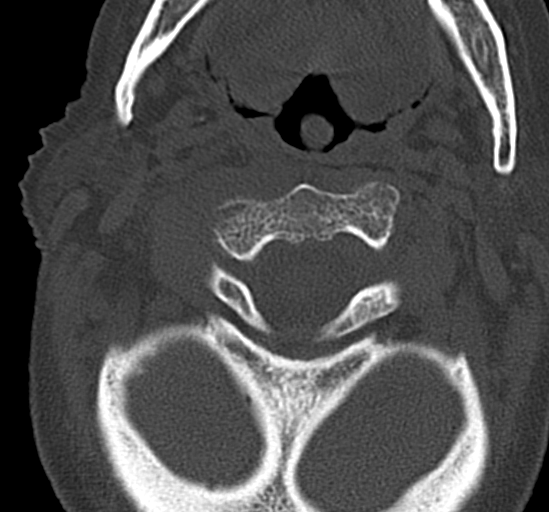
[im 68/82  soft-tissue]
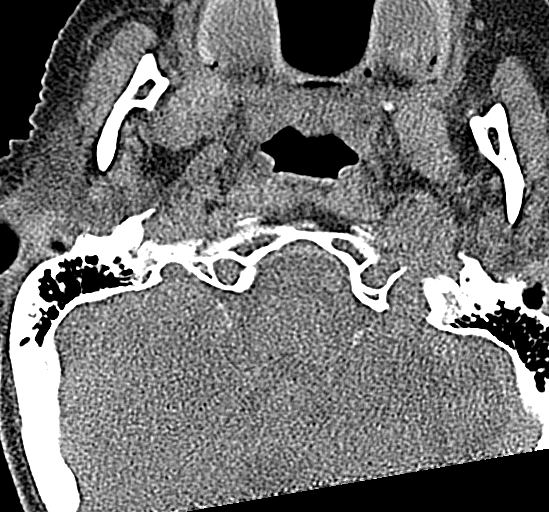
[im 68/82  bone]
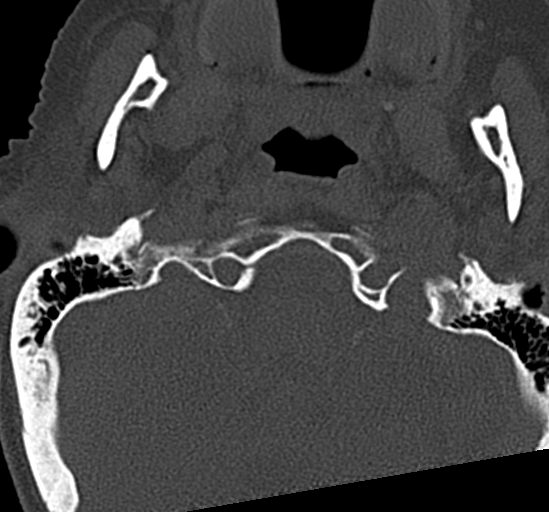

[13 of 34 positions shown; findings below may reference images not displayed]

FINDINGS: CT HEAD FINDINGS

Brain: There is no mass, hemorrhage or extra-axial collection. The
size and configuration of the ventricles and extra-axial CSF spaces
are normal. There is hypoattenuation of the periventricular white
matter, most commonly indicating chronic ischemic microangiopathy.

Vascular: Atherosclerotic calcification of the internal carotid
arteries at the skull base. No abnormal hyperdensity of the major
intracranial arteries or dural venous sinuses.

Skull: The visualized skull base, calvarium and extracranial soft
tissues are normal.

Sinuses/Orbits: No fluid levels or advanced mucosal thickening of
the visualized paranasal sinuses. No mastoid or middle ear effusion.
The orbits are normal.

CT CERVICAL SPINE FINDINGS

Alignment: No static subluxation. Facets are aligned. Occipital
condyles are normally positioned.

Skull base and vertebrae: No acute fracture.

Soft tissues and spinal canal: No prevertebral fluid or swelling. No
visible canal hematoma.

Disc levels: No advanced spinal canal or neural foraminal stenosis.

Upper chest: No pneumothorax, pulmonary nodule or pleural effusion.

Other: Normal visualized paraspinal cervical soft tissues.
IMPRESSION: 1. Chronic ischemic microangiopathy without acute intracranial
abnormality.
2. No acute fracture or static subluxation of the cervical spine.

## 2020-10-10 NOTE — ED Triage Notes (Signed)
80 y/o female arrived to the [facility] coming from the [facility] by EMS with a CC of a fall. EMS states pt has a unwittnessed fall from bed. Pt is unaware how long she was on the floor. Upon assessment pt states she is having right hip pain and back pain. Pt notes she has had back pain in the past. Pt denies hitting her head and denies being on blood thinners. No obvious deformities noted on pt right hip and no shortening of the legs. PMS was established in both lower extremties. Pt states she usually gets around with a walker. Pt is A&Ox4

## 2020-10-10 NOTE — ED Provider Notes (Signed)
[facility] Emergency Department Provider Note    Event Date/Time   First MD Initiated Contact with Patient 10/10/20 0036     (approximate)  I have reviewed the triage vital signs and the nursing notes.   HISTORY  Chief Complaint Fall and Hip Pain    HPI Danielle Steele is a 80 y.o. female with the below listed past medical history presents to ER from facility after falling out of her bed.  She is complaining of some mild right hip pain.  No other injury or disc comfort.  Cannot recall if she struck her head.  No chest pain or pressure.  No shortness of breath.  No abdominal pain.    Past Medical History:  Diagnosis Date  . Anxiety   . Bipolar disorder ([place])   . Depression   . GERD (gastroesophageal reflux disease)   . Hypertension   . PND (post-nasal drip)    CAUSES SOME COUGH  . Pre-diabetes   . Schizophrenia ([place])    SCHIZO  AFFECTIVE DISORDER  . Thyroid nodule   . Tremors of nervous system    History reviewed. No pertinent family history. Past Surgical History:  Procedure Laterality Date  . CATARACT EXTRACTION W/PHACO Left 09/07/2017   Procedure: CATARACT EXTRACTION PHACO AND INTRAOCULAR LENS PLACEMENT (IOC);  Surgeon: Birder Robson, MD;  Location: [facility] ORS;  Service: Ophthalmology;  Laterality: Left;  [place] 00:39.9 AP% 17.4 CDE 6.94 Fluid Pack lot # [id number]  . CATARACT EXTRACTION W/PHACO Right 09/28/2017   Procedure: CATARACT EXTRACTION PHACO AND INTRAOCULAR LENS PLACEMENT (IOC);  Surgeon: Birder Robson, MD;  Location: [facility] ORS;  Service: Ophthalmology;  Laterality: Right;  [place] 01:31.8 AP% 13.7 CDE 12.55 Fluid Pack Lot # [id number]   . EYE SURGERY    . TONSILLECTOMY     There are no problems to display for this patient.     Prior to Admission medications   Medication Sig Start Date End Date Taking? Authorizing Provider  acetaminophen (TYLENOL) 325 MG tablet Take 650 mg by mouth every 4 (four) hours as needed for mild pain  or moderate pain.    [provider]  atorvastatin (LIPITOR) 20 MG tablet Take 20 mg by mouth every evening.    [provider]  barrier cream (NON-SPECIFIED) CREA Apply 1 application topically as needed (apply topically after toileting as needed to prevent skin breakdown).    [provider]  benztropine (COGENTIN) 0.5 MG tablet Take 0.5 mg by mouth 2 (two) times daily.    [provider]  buPROPion (WELLBUTRIN XL) 150 MG 24 hr tablet Take 150 mg by mouth every morning.    [provider]  Camphor-Menthol (MEN-PHOR EX) Apply 1 application topically 2 (two) times daily as needed.    [provider]  camphor-menthol Timoteo Ace) lotion Apply 1 application topically 2 (two) times daily. Apply to back    [provider]  cholecalciferol (VITAMIN D) 1000 units tablet Take 2,000 Units by mouth daily.     [provider]  clonazePAM (KLONOPIN) 0.5 MG tablet Take 0.5 mg by mouth 3 (three) times daily.    [provider]  Cranberry 250 MG CAPS Take 500 mg by mouth daily.     [provider]  Difluprednate (DUREZOL) 0.05 % EMUL Apply 1 drop to eye 2 (two) times daily.    [provider]  fluPHENAZine decanoate (PROLIXIN) 25 MG/ML injection Inject 25 mg into the muscle every 30 (thirty) days.    [provider]  guaiFENesin (ROBITUSSIN) 100 MG/5ML liquid Take 300 mg by mouth every 6 (six) hours as needed for cough.     [provider]  ketoconazole (NIZORAL) 2 % cream Apply 1 application topically 2 (two) times daily.    [provider]  lidocaine (LIDODERM) 5 % Place 1 patch onto the skin every 12 (twelve) hours. Remove & Discard patch within 12 hours or as directed by MD 05/30/20 05/30/21  Sable Feil, PA-C  lisinopril (PRINIVIL,ZESTRIL) 5 MG tablet Take 5 mg by mouth at bedtime.    [provider]  lithium carbonate 150 MG capsule Take 150 mg by mouth 2 (two) times daily with  a meal.    [provider]  loperamide (IMODIUM A-D) 2 MG tablet Take 4 mg by mouth as needed for diarrhea or loose stools (No more than 8 doses in 24 hours).     [provider]  magnesium hydroxide (MILK OF MAGNESIA) 400 MG/5ML suspension Take 30 mLs by mouth daily as needed for mild constipation.    [provider]  moxifloxacin (VIGAMOX) 0.5 % ophthalmic solution Apply 1 drop to eye 4 (four) times daily.    [provider]  saccharomyces boulardii (FLORASTOR) 250 MG capsule Take 250 mg by mouth daily.    [provider]  traZODone (DESYREL) 50 MG tablet Take 50 mg by mouth at bedtime.    [provider]    Allergies Citrus    Social History Social History   Tobacco Use  . Smoking status: Former Research scientist (life sciences)  . Smokeless tobacco: Never Used  Vaping Use  . Vaping Use: Never used  Substance Use Topics  . Alcohol use: No  . Drug use: No    Review of Systems Patient denies headaches, rhinorrhea, blurry vision, numbness, shortness of breath, chest pain, edema, cough, abdominal pain, nausea, vomiting, diarrhea, dysuria, fevers, rashes or hallucinations unless otherwise stated above in HPI. ____________________________________________   PHYSICAL EXAM:  VITAL SIGNS: Vitals:   10/10/20 0041 10/10/20 0320  BP: (!) 105/58 (!) 106/57  Pulse: 80 77  Resp: 20 20  Temp: 98 F (36.7 C)   SpO2: 97% 94%    Constitutional: Alert and in NAD.  Eyes: Conjunctivae are normal.  Head: Atraumatic. Nose: No congestion/rhinnorhea. Mouth/Throat: Mucous membranes are moist.   Neck: No stridor. Painless ROM.  Cardiovascular: Normal rate, regular rhythm. Grossly normal heart sounds.  Good peripheral circulation. Respiratory: Normal respiratory effort.  No retractions. Lungs CTAB. Gastrointestinal: Soft and nontender. No distention. No abdominal bruits. No CVA tenderness. Genitourinary:  Musculoskeletal: No lower extremity tenderness nor edema.  Mild ttp in right hip with log roll. No joint effusions. Neurologic:  Normal speech and language. No gross focal neurologic deficits are appreciated. No facial droop Skin:  Skin is warm, dry and intact. No rash noted. Psychiatric: Mood and affect are normal. Speech and behavior are normal.  ____________________________________________   LABS (all labs ordered are listed, but only abnormal results are displayed)  Results for orders placed or performed during the hospital encounter of 10/10/20 (from the past 24 hour(s))  CBC with Differential     Status: Abnormal   Collection Time: 10/10/20 12:45 AM  Result Value Ref Range   WBC 14.6 (H) 4.0 - 10.5 K/uL   RBC 3.46 (L) 3.87 - 5.11 MIL/uL   Hemoglobin 10.8 (L) 12.0 - 15.0 g/dL   HCT 33.8 (L) 36.0 - 46.0 %   MCV 97.7 80.0 - 100.0 fL  MCH 31.2 26.0 - 34.0 pg   MCHC 32.0 30.0 - 36.0 g/dL   RDW 12.7 11.5 - 15.5 %   Platelets 331 150 - 400 K/uL   nRBC 0.0 0.0 - 0.2 %   Neutrophils Relative % 67 %   Neutro Abs 9.9 (H) 1.7 - 7.7 K/uL   Lymphocytes Relative 9 %   Lymphs Abs 1.4 0.7 - 4.0 K/uL   Monocytes Relative 12 %   Monocytes Absolute 1.8 (H) 0.1 - 1.0 K/uL   Eosinophils Relative 10 %   Eosinophils Absolute 1.4 (H) 0.0 - 0.5 K/uL   Basophils Relative 1 %   Basophils Absolute 0.1 0.0 - 0.1 K/uL   Immature Granulocytes 1 %   Abs Immature Granulocytes 0.08 (H) 0.00 - 0.07 K/uL  Comprehensive metabolic panel     Status: Abnormal   Collection Time: 10/10/20 12:45 AM  Result Value Ref Range   Sodium 136 135 - 145 mmol/L   Potassium 3.7 3.5 - 5.1 mmol/L   Chloride 104 98 - 111 mmol/L   CO2 24 22 - 32 mmol/L   Glucose, Bld 129 (H) 70 - 99 mg/dL   BUN 10 8 - 23 mg/dL   Creatinine, Ser 0.92 0.44 - 1.00 mg/dL   Calcium 9.6 8.9 - 10.3 mg/dL   Total Protein 7.0 6.5 - 8.1 g/dL   Albumin 3.1 (L) 3.5 - 5.0 g/dL   AST 11 (L) 15 - 41 U/L   ALT 11 0 - 44 U/L   Alkaline Phosphatase 84 38 - 126 U/L   Total Bilirubin 0.6 0.3 - 1.2 mg/dL    GFR, Estimated >60 >60 mL/min   Anion gap 8 5 - 15  CK     Status: None   Collection Time: 10/10/20 12:45 AM  Result Value Ref Range   Total CK 79 38 - 234 U/L  Urinalysis, Complete w Microscopic     Status: Abnormal   Collection Time: 10/10/20  3:10 AM  Result Value Ref Range   Color, Urine STRAW (A) YELLOW   APPearance CLEAR (A) CLEAR   Specific Gravity, Urine 1.003 (L) 1.005 - 1.030   pH 7.0 5.0 - 8.0   Glucose, UA NEGATIVE NEGATIVE mg/dL   Hgb urine dipstick LARGE (A) NEGATIVE   Bilirubin Urine NEGATIVE NEGATIVE   Ketones, ur NEGATIVE NEGATIVE mg/dL   Protein, ur NEGATIVE NEGATIVE mg/dL   Nitrite NEGATIVE NEGATIVE   Leukocytes,Ua NEGATIVE NEGATIVE   RBC / HPF 0-5 0 - 5 RBC/hpf   WBC, UA 0-5 0 - 5 WBC/hpf   Bacteria, UA NONE SEEN NONE SEEN   Squamous Epithelial / LPF NONE SEEN 0 - 5   ____________________________________________  EKG My review and personal interpretation at Time: 00:46   Indication: fall  Rate: 75  Rhythm: sinus Axis: normal Other: normal intervals, no stemi ____________________________________________  RADIOLOGY  I personally reviewed all radiographic images ordered to evaluate for the above acute complaints and reviewed radiology reports and findings.  These findings were personally discussed with the patient.  Please see medical record for radiology report.  ____________________________________________   PROCEDURES  Procedure(s) performed:  Procedures    Critical Care performed: no ____________________________________________   INITIAL IMPRESSION / ASSESSMENT AND PLAN / ED COURSE  Pertinent labs & imaging results that were available during my care of the patient were reviewed by me and considered in my medical decision making (see chart for details).   DDX: Fracture, contusion, dislocation, lecture light abnormality, SDH, IPH  Evangaline Jou is a 80 y.o. who presents to the ED with presentation as described above.  Patient  nontoxic-appearing.  Given her age imaging sent for the but differential.  CT imaging of the brain does not show any evidence of acute intracranial abnormality.  No acute neuro deficits.  As he was primarily mechanical fall out of bed.  Will order x-ray of the right hip.  Will order his basic blood work and urinalysis.  Clinical Course as of 10/10/20 0334  Thu Oct 10, 2020  0321 Reassessed.  Nontoxic.  X-ray work-up is reassuring.  Still waiting on urine.  Feels member at bedside states that she is acting at her baseline no other concerns related.  Do think patient will be appropriate for discharge and outpatient follow up. [PR]    Clinical Course User Index [PR] Merlyn Lot, MD    The patient was evaluated in Emergency Department today for the symptoms described in the history of present illness. He/she was evaluated in the context of the global COVID-19 pandemic, which necessitated consideration that the patient might be at risk for infection with the SARS-CoV-2 virus that causes COVID-19. Institutional protocols and algorithms that pertain to the evaluation of patients at risk for COVID-19 are in a state of rapid change based on information released by regulatory bodies including the CDC and federal and state organizations. These policies and algorithms were followed during the patient's care in the ED.  As part of my medical decision making, I reviewed the following data within the [place] notes reviewed and incorporated, Labs reviewed, notes from prior ED visits and [place] Controlled Substance Database   ____________________________________________   FINAL CLINICAL IMPRESSION(S) / ED DIAGNOSES  Final diagnoses:  Fall, initial encounter  Right hip pain      NEW MEDICATIONS STARTED DURING THIS VISIT:  New Prescriptions   No medications on file     Note:  This document was prepared using Dragon voice recognition software and may include unintentional  dictation errors.    Merlyn Lot, MD 10/10/20 [phone]

## 2020-11-08 ENCOUNTER — Other Ambulatory Visit: Payer: Self-pay

## 2020-11-08 ENCOUNTER — Emergency Department: Payer: Medicare Other

## 2020-11-08 ENCOUNTER — Inpatient Hospital Stay: Payer: Medicare Other

## 2020-11-08 ENCOUNTER — Encounter: Payer: Self-pay | Admitting: Emergency Medicine

## 2020-11-08 ENCOUNTER — Inpatient Hospital Stay
Admission: EM | Admit: 2020-11-08 | Discharge: 2020-11-15 | DRG: 477 | Disposition: A | Discharge status: Skilled Nursing Facility | Payer: Medicare Other | Source: Skilled Nursing Facility | Attending: Internal Medicine | Admitting: Internal Medicine

## 2020-11-08 DIAGNOSIS — F319 Bipolar disorder, unspecified: Secondary | ICD-10-CM | POA: Diagnosis present

## 2020-11-08 DIAGNOSIS — F209 Schizophrenia, unspecified: Secondary | ICD-10-CM | POA: Diagnosis present

## 2020-11-08 DIAGNOSIS — F259 Schizoaffective disorder, unspecified: Secondary | ICD-10-CM | POA: Clinically undetermined

## 2020-11-08 DIAGNOSIS — Z961 Presence of intraocular lens: Secondary | ICD-10-CM | POA: Diagnosis present

## 2020-11-08 DIAGNOSIS — Z7189 Other specified counseling: Secondary | ICD-10-CM

## 2020-11-08 DIAGNOSIS — I10 Essential (primary) hypertension: Secondary | ICD-10-CM | POA: Diagnosis present

## 2020-11-08 DIAGNOSIS — N179 Acute kidney failure, unspecified: Secondary | ICD-10-CM | POA: Diagnosis present

## 2020-11-08 DIAGNOSIS — K219 Gastro-esophageal reflux disease without esophagitis: Secondary | ICD-10-CM | POA: Diagnosis present

## 2020-11-08 DIAGNOSIS — R1011 Right upper quadrant pain: Secondary | ICD-10-CM

## 2020-11-08 DIAGNOSIS — G9341 Metabolic encephalopathy: Secondary | ICD-10-CM | POA: Diagnosis present

## 2020-11-08 DIAGNOSIS — Z515 Encounter for palliative care: Secondary | ICD-10-CM

## 2020-11-08 DIAGNOSIS — E785 Hyperlipidemia, unspecified: Secondary | ICD-10-CM | POA: Diagnosis present

## 2020-11-08 DIAGNOSIS — G936 Cerebral edema: Secondary | ICD-10-CM | POA: Diagnosis present

## 2020-11-08 DIAGNOSIS — R7303 Prediabetes: Secondary | ICD-10-CM | POA: Diagnosis present

## 2020-11-08 DIAGNOSIS — Z87891 Personal history of nicotine dependence: Secondary | ICD-10-CM

## 2020-11-08 DIAGNOSIS — E860 Dehydration: Secondary | ICD-10-CM | POA: Diagnosis present

## 2020-11-08 DIAGNOSIS — Z993 Dependence on wheelchair: Secondary | ICD-10-CM

## 2020-11-08 DIAGNOSIS — C7951 Secondary malignant neoplasm of bone: Principal | ICD-10-CM

## 2020-11-08 DIAGNOSIS — Z79899 Other long term (current) drug therapy: Secondary | ICD-10-CM

## 2020-11-08 DIAGNOSIS — Z9841 Cataract extraction status, right eye: Secondary | ICD-10-CM | POA: Diagnosis not present

## 2020-11-08 DIAGNOSIS — C3490 Malignant neoplasm of unspecified part of unspecified bronchus or lung: Secondary | ICD-10-CM

## 2020-11-08 DIAGNOSIS — R9389 Abnormal findings on diagnostic imaging of other specified body structures: Secondary | ICD-10-CM | POA: Diagnosis not present

## 2020-11-08 DIAGNOSIS — A419 Sepsis, unspecified organism: Secondary | ICD-10-CM

## 2020-11-08 DIAGNOSIS — M4854XA Collapsed vertebra, not elsewhere classified, thoracic region, initial encounter for fracture: Secondary | ICD-10-CM | POA: Diagnosis present

## 2020-11-08 DIAGNOSIS — R54 Age-related physical debility: Secondary | ICD-10-CM | POA: Diagnosis present

## 2020-11-08 DIAGNOSIS — C7800 Secondary malignant neoplasm of unspecified lung: Secondary | ICD-10-CM | POA: Diagnosis present

## 2020-11-08 DIAGNOSIS — N3001 Acute cystitis with hematuria: Secondary | ICD-10-CM

## 2020-11-08 DIAGNOSIS — D72821 Monocytosis (symptomatic): Secondary | ICD-10-CM | POA: Diagnosis present

## 2020-11-08 DIAGNOSIS — Z66 Do not resuscitate: Secondary | ICD-10-CM | POA: Diagnosis present

## 2020-11-08 DIAGNOSIS — Z20822 Contact with and (suspected) exposure to covid-19: Secondary | ICD-10-CM | POA: Diagnosis present

## 2020-11-08 DIAGNOSIS — C801 Malignant (primary) neoplasm, unspecified: Secondary | ICD-10-CM

## 2020-11-08 DIAGNOSIS — F32A Depression, unspecified: Secondary | ICD-10-CM | POA: Diagnosis present

## 2020-11-08 DIAGNOSIS — C7931 Secondary malignant neoplasm of brain: Secondary | ICD-10-CM | POA: Diagnosis present

## 2020-11-08 DIAGNOSIS — R109 Unspecified abdominal pain: Secondary | ICD-10-CM | POA: Diagnosis present

## 2020-11-08 DIAGNOSIS — Z9842 Cataract extraction status, left eye: Secondary | ICD-10-CM | POA: Diagnosis not present

## 2020-11-08 DIAGNOSIS — R591 Generalized enlarged lymph nodes: Secondary | ICD-10-CM | POA: Diagnosis not present

## 2020-11-08 DIAGNOSIS — N390 Urinary tract infection, site not specified: Secondary | ICD-10-CM | POA: Diagnosis present

## 2020-11-08 DIAGNOSIS — R4587 Impulsiveness: Secondary | ICD-10-CM | POA: Diagnosis present

## 2020-11-08 DIAGNOSIS — F419 Anxiety disorder, unspecified: Secondary | ICD-10-CM | POA: Diagnosis present

## 2020-11-08 DIAGNOSIS — R319 Hematuria, unspecified: Secondary | ICD-10-CM

## 2020-11-08 LAB — COMPREHENSIVE METABOLIC PANEL
ALT: 11 U/L (ref 0–44)
AST: 18 U/L (ref 15–41)
Albumin: 2.7 g/dL — ABNORMAL LOW (ref 3.5–5.0)
Alkaline Phosphatase: 97 U/L (ref 38–126)
Anion gap: 8 (ref 5–15)
BUN: 25 mg/dL — ABNORMAL HIGH (ref 8–23)
CO2: 21 mmol/L — ABNORMAL LOW (ref 22–32)
Calcium: 9.5 mg/dL (ref 8.9–10.3)
Chloride: 103 mmol/L (ref 98–111)
Creatinine, Ser: 1.57 mg/dL — ABNORMAL HIGH (ref 0.44–1.00)
GFR, Estimated: 34 mL/min — ABNORMAL LOW (ref >60)
Glucose, Bld: 108 mg/dL — ABNORMAL HIGH (ref 70–99)
Potassium: 4.2 mmol/L (ref 3.5–5.1)
Sodium: 132 mmol/L — ABNORMAL LOW (ref 135–145)
Total Bilirubin: 0.6 mg/dL (ref 0.3–1.2)
Total Protein: 6.2 g/dL — ABNORMAL LOW (ref 6.5–8.1)

## 2020-11-08 LAB — CBC WITH DIFFERENTIAL/PLATELET
Abs Immature Granulocytes: 0.34 10*3/uL — ABNORMAL HIGH (ref 0.00–0.07)
Basophils Absolute: 0.1 10*3/uL (ref 0.0–0.1)
Basophils Relative: 1 %
Eosinophils Absolute: 1.6 10*3/uL — ABNORMAL HIGH (ref 0.0–0.5)
Eosinophils Relative: 7 %
HCT: 31.8 % — ABNORMAL LOW (ref 36.0–46.0)
Hemoglobin: 10.3 g/dL — ABNORMAL LOW (ref 12.0–15.0)
Immature Granulocytes: 1 %
Lymphocytes Relative: 4 %
Lymphs Abs: 1 10*3/uL (ref 0.7–4.0)
MCH: 30.9 pg (ref 26.0–34.0)
MCHC: 32.4 g/dL (ref 30.0–36.0)
MCV: 95.5 fL (ref 80.0–100.0)
Monocytes Absolute: 2 10*3/uL — ABNORMAL HIGH (ref 0.1–1.0)
Monocytes Relative: 8 %
Neutro Abs: 19.1 10*3/uL — ABNORMAL HIGH (ref 1.7–7.7)
Neutrophils Relative %: 79 %
Platelets: 291 10*3/uL (ref 150–400)
RBC: 3.33 MIL/uL — ABNORMAL LOW (ref 3.87–5.11)
RDW: 13.5 % (ref 11.5–15.5)
WBC: 24.1 10*3/uL — ABNORMAL HIGH (ref 4.0–10.5)
nRBC: 0 % (ref 0.0–0.2)

## 2020-11-08 LAB — URINALYSIS, COMPLETE (UACMP) WITH MICROSCOPIC
Bacteria, UA: NONE SEEN
Bilirubin Urine: NEGATIVE
Glucose, UA: NEGATIVE mg/dL
Ketones, ur: NEGATIVE mg/dL
Leukocytes,Ua: NEGATIVE
Nitrite: NEGATIVE
Protein, ur: NEGATIVE mg/dL
RBC / HPF: >50 RBC/hpf — ABNORMAL HIGH (ref 0–5)
Specific Gravity, Urine: 1.012 (ref 1.005–1.030)
pH: 6 (ref 5.0–8.0)

## 2020-11-08 LAB — RESP PANEL BY RT-PCR (FLU A&B, COVID) ARPGX2
Influenza A by PCR: NEGATIVE
Influenza B by PCR: NEGATIVE
SARS Coronavirus 2 by RT PCR: NEGATIVE

## 2020-11-08 LAB — LITHIUM LEVEL: Lithium Lvl: 0.79 mmol/L (ref 0.60–1.20)

## 2020-11-08 LAB — PROTIME-INR
INR: 1.2 (ref 0.8–1.2)
Prothrombin Time: 14.8 seconds (ref 11.4–15.2)

## 2020-11-08 LAB — LACTIC ACID, PLASMA: Lactic Acid, Venous: 1.9 mmol/L (ref 0.5–1.9)

## 2020-11-08 LAB — APTT: aPTT: 32 seconds (ref 24–36)

## 2020-11-08 LAB — PROCALCITONIN: Procalcitonin: <0.1 ng/mL

## 2020-11-08 LAB — MRSA PCR SCREENING: MRSA by PCR: NEGATIVE

## 2020-11-08 IMAGING — DX DG CHEST 1V PORT
1 series · 1 of 1 positions shown · non-contrast
Comparison: None.

CLINICAL DATA: Questionable sepsis

EXAM:
PORTABLE CHEST 1 VIEW

[chest ap]
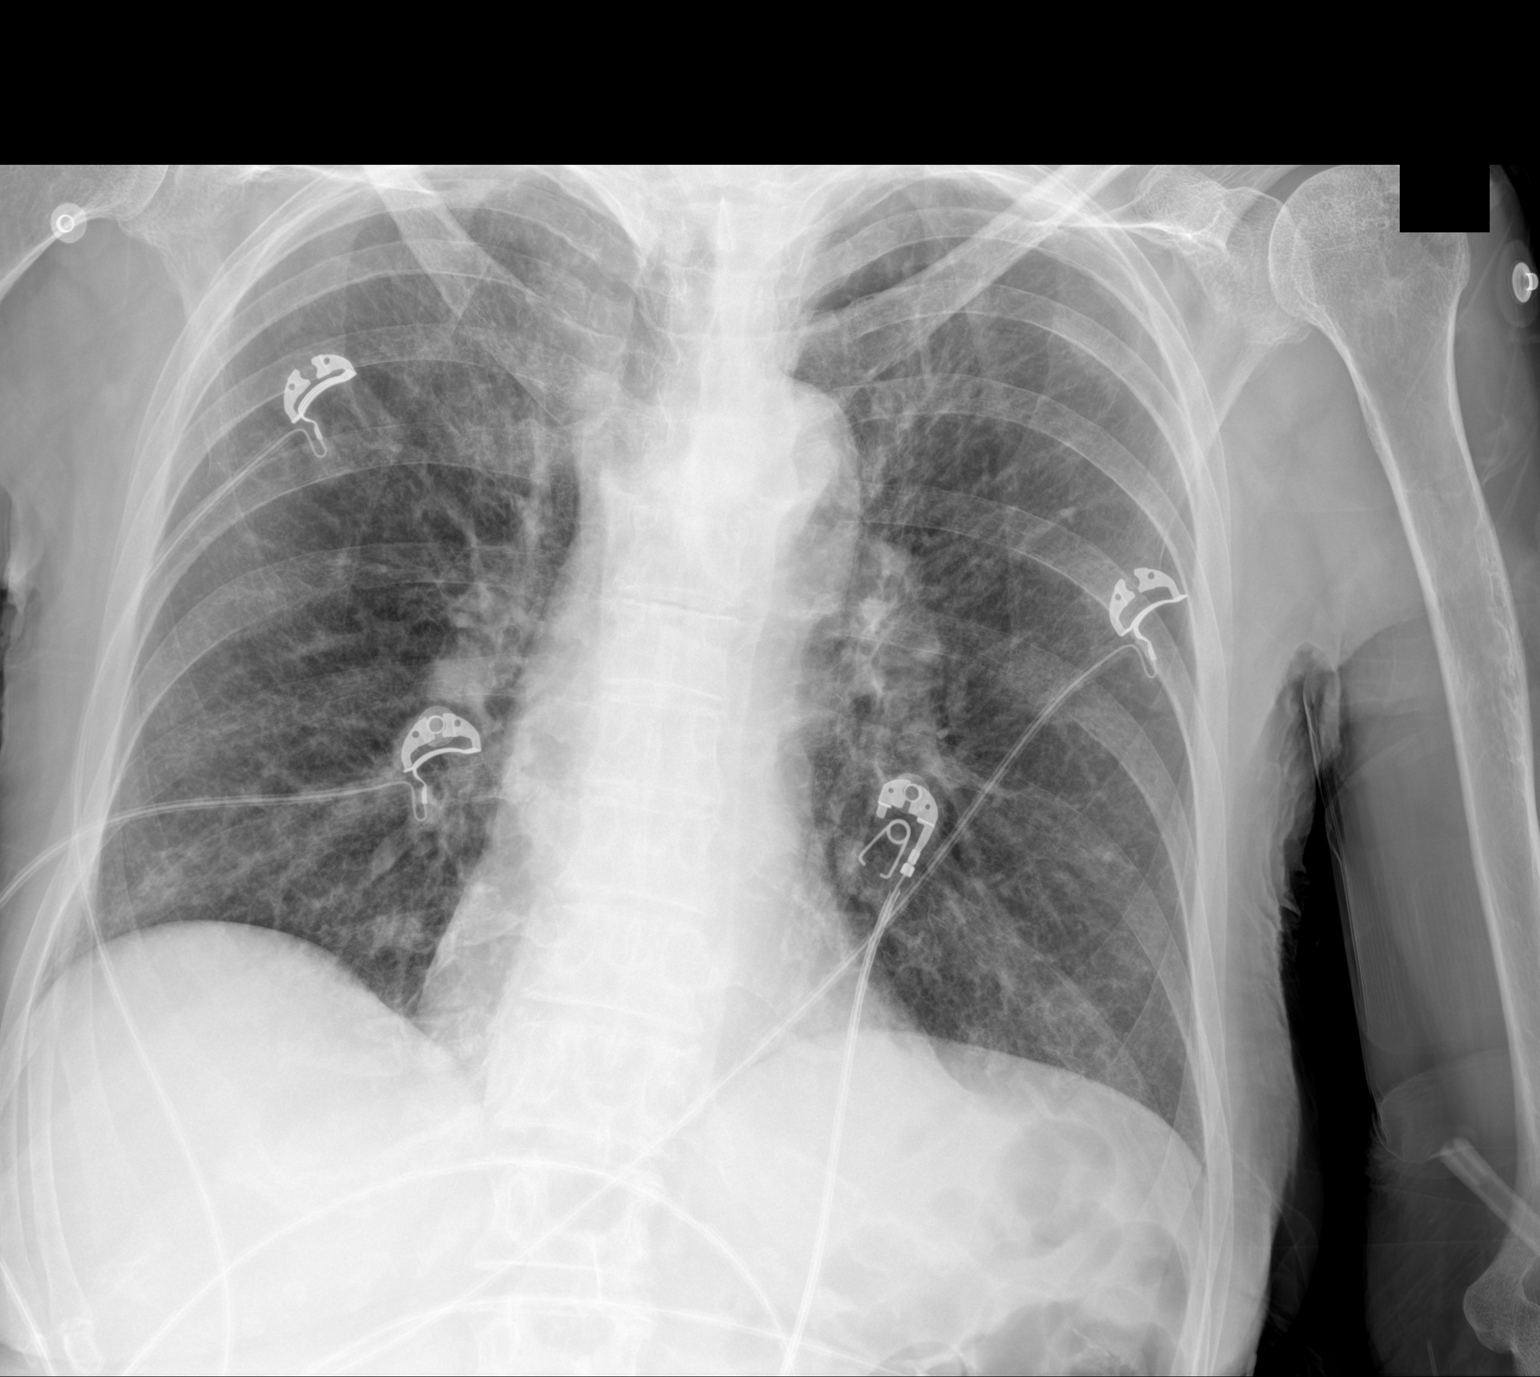

[1 of 1 positions shown; findings below may reference images not displayed]

FINDINGS: Mild coarsening of lung markings. No focal pneumonia. No edema,
effusion, or pneumothorax. Normal heart size and mediastinal
contours.
IMPRESSION: 1. No focal pneumonia.
2. Coarsened lung markings which could be chronic lung disease or
atypical infection.

## 2020-11-08 IMAGING — CT CT HEAD W/O CM
3 series · 15 of 47 positions shown, 18 images · non-contrast
Comparison: [DATE]

CLINICAL DATA: Mental status change

EXAM:
CT HEAD WITHOUT CONTRAST
TECHNIQUE: Contiguous axial images were obtained from the base of the skull
through the vertex without intravenous contrast.

[Series 3: head wo · axial · 0.42mm/px · z∈[+304,+429]mm · 9 of 31 slices shown, 12 images]
[im 3/31  brain]
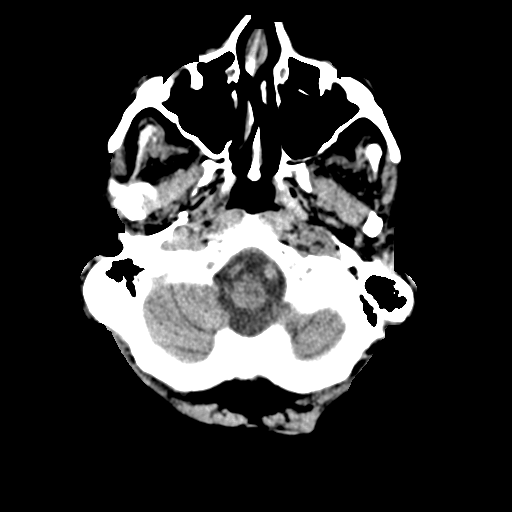
[im 3/31  bone]
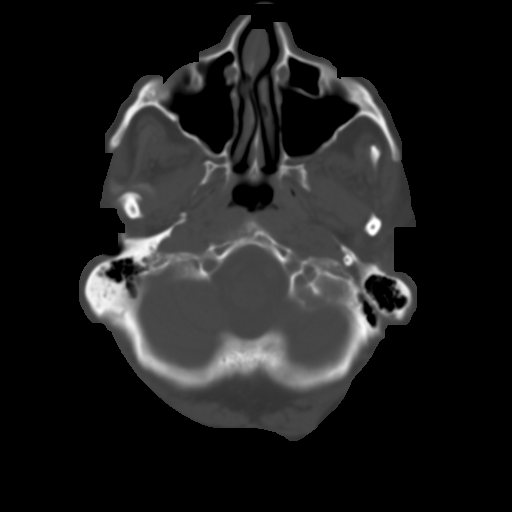
[im 6/31  brain]
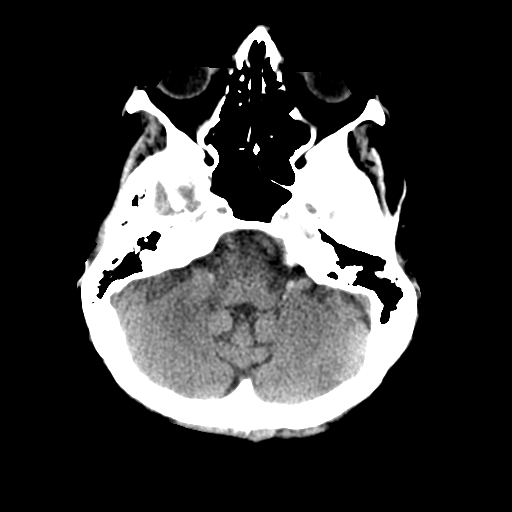
[im 9/31  brain]
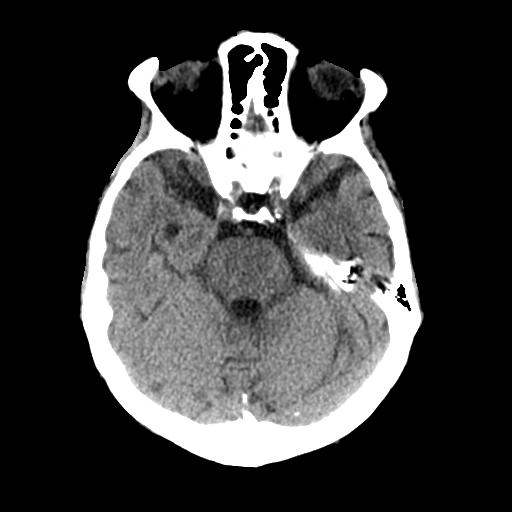
[im 12/31  brain]
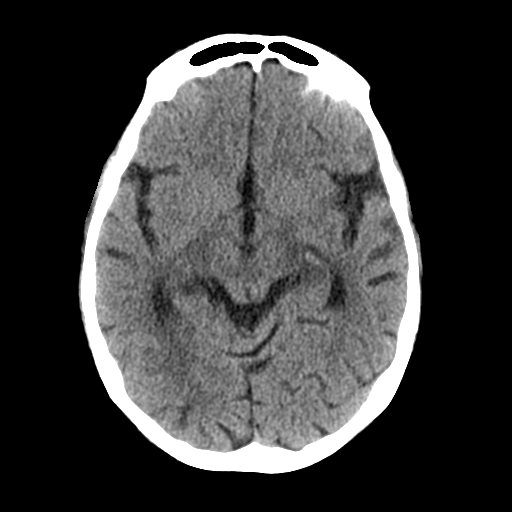
[im 16/31  brain]
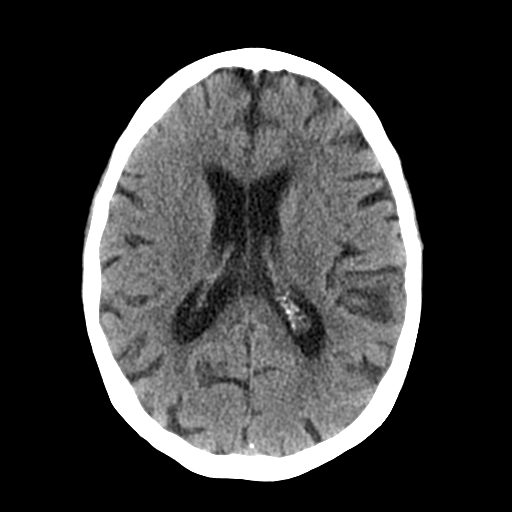
[im 16/31  bone]
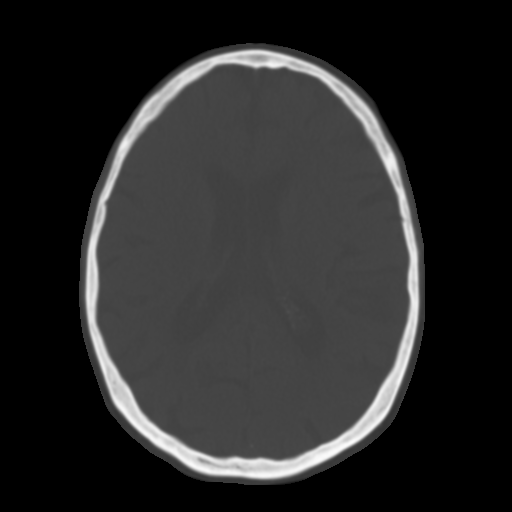
[im 19/31  brain]
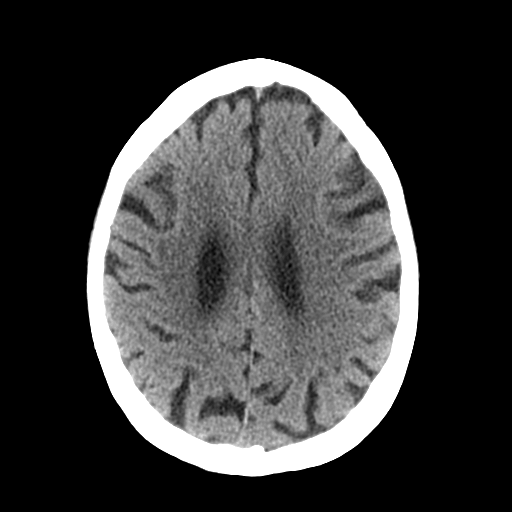
[im 22/31  brain]
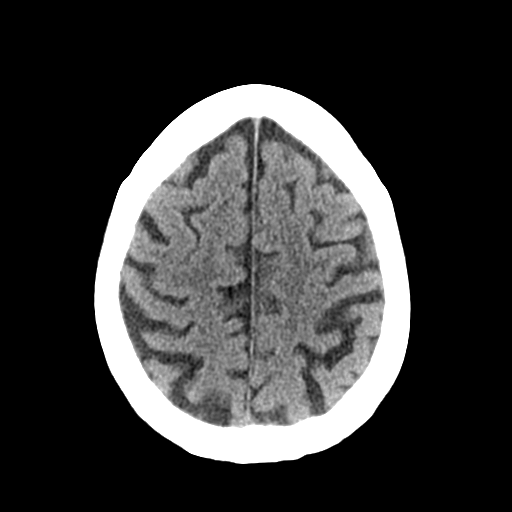
[im 25/31  brain]
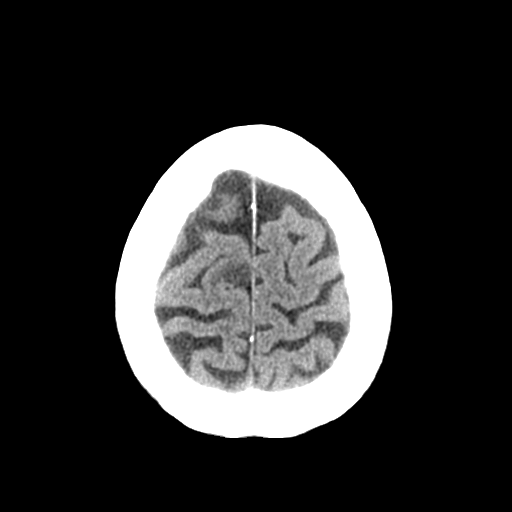
[im 28/31  brain]
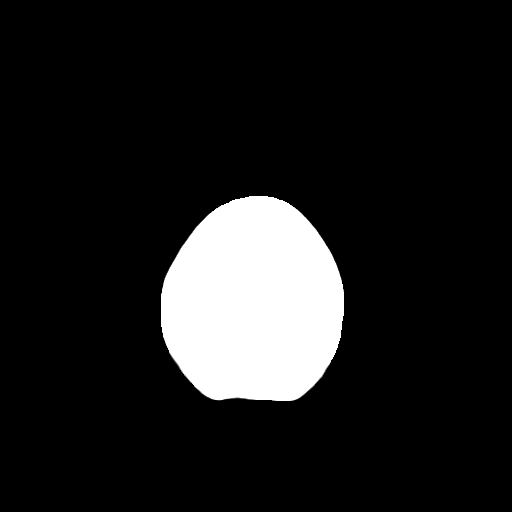
[im 28/31  bone]
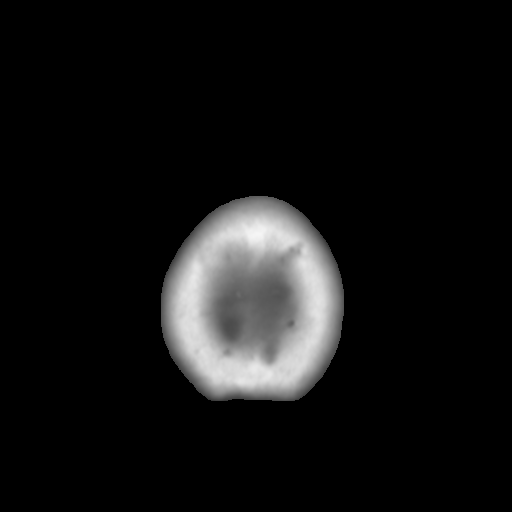

[Series 4: coronal soft tissue · coronal · 0.30mm/px · 3 of 65 slices shown]
[im 22/65  brain]
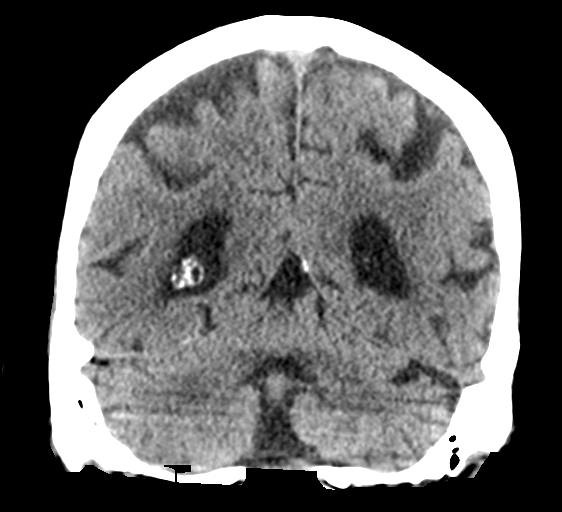
[im 29/65  brain]
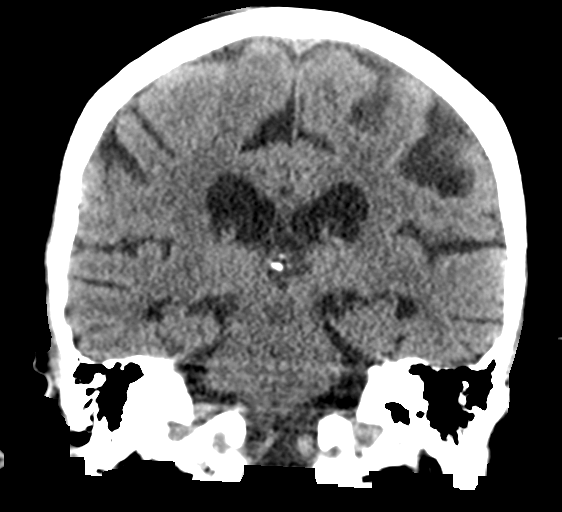
[im 36/65  brain]
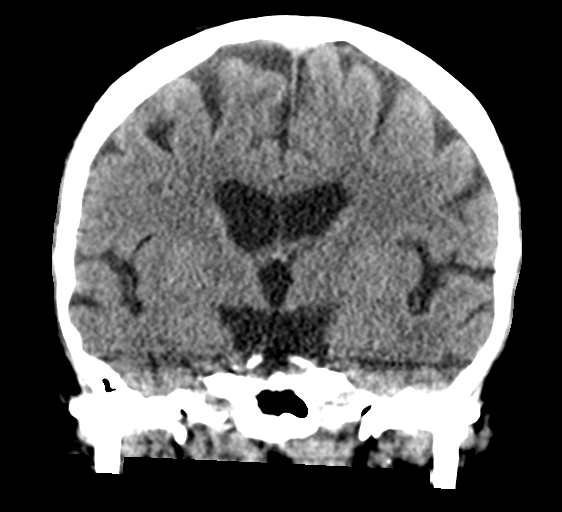

[Series 5: sagittal soft tissue · sagittal · 0.31mm/px · 3 of 51 slices shown]
[im 17/51  brain]
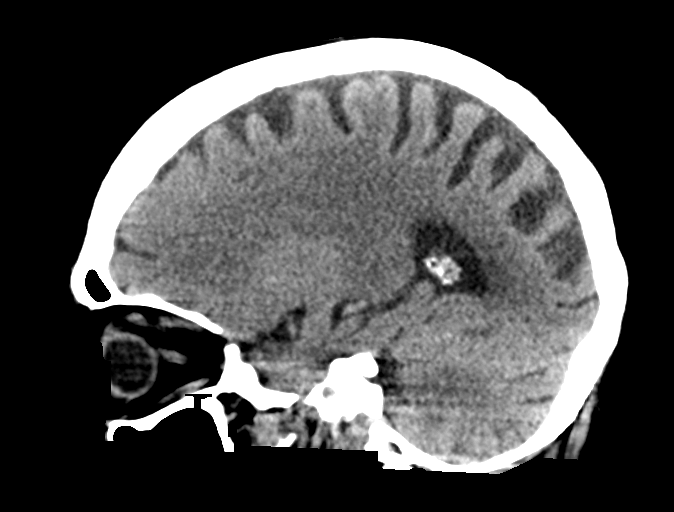
[im 26/51  brain]
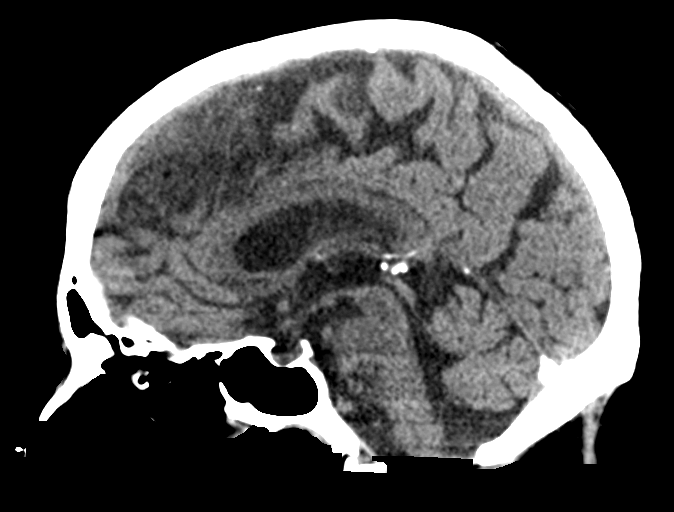
[im 34/51  brain]
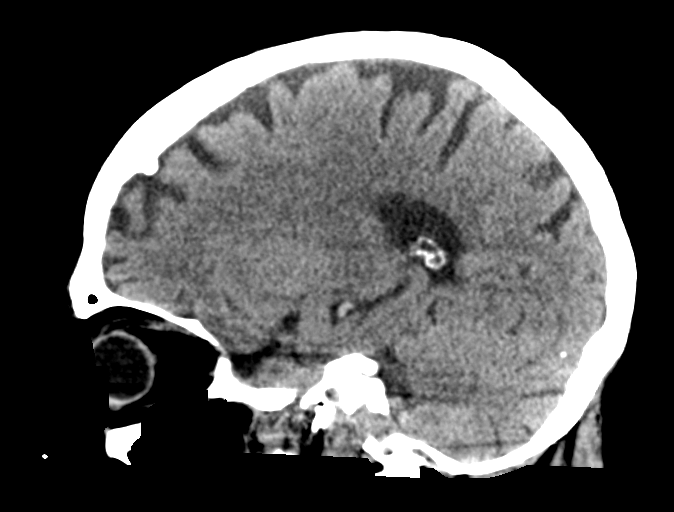

[15 of 47 positions shown; findings below may reference images not displayed]

FINDINGS: Brain: No evidence of acute territorial infarction, hemorrhage,
hydrocephalus,extra-axial collection or mass lesion/mass effect.
There is dilatation the ventricles and sulci consistent with
age-related atrophy. Low-attenuation changes in the deep white
matter consistent with small vessel ischemia.

Vascular: No hyperdense vessel or unexpected calcification.

Skull: The skull is intact. No fracture or focal lesion identified.

Sinuses/Orbits: The visualized paranasal sinuses and mastoid air
cells are clear. The orbits and globes intact.

Other: None
IMPRESSION: No acute intracranial abnormality.

Findings consistent with age related atrophy and chronic small
vessel ischemia

## 2020-11-08 IMAGING — US US ABDOMEN LIMITED RUQ/ASCITES
1 series · 15 of 25 positions shown · non-contrast
Comparison: None.

CLINICAL DATA: Right upper quadrant pain

EXAM:
ULTRASOUND ABDOMEN LIMITED RIGHT UPPER QUADRANT

[Series 1: us abdomen limited ruq · 15 of 41 slices shown]
[im 1/41]
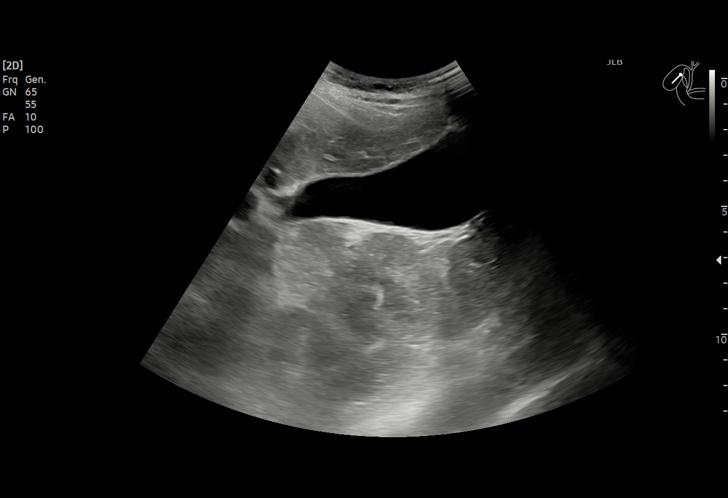
[im 4/41]
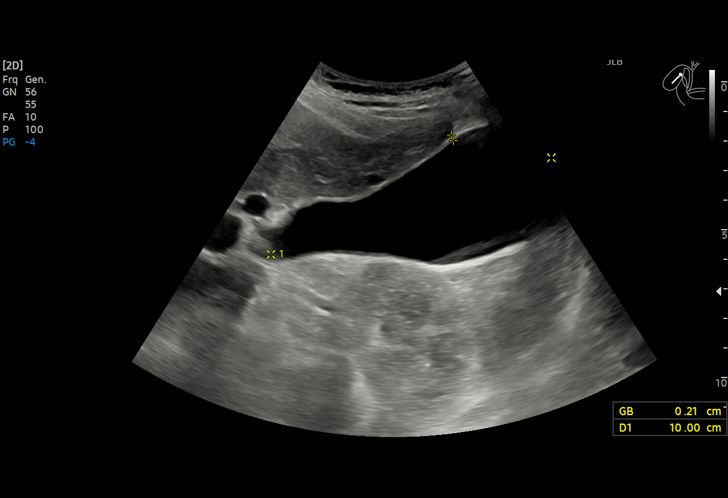
[im 7/41]
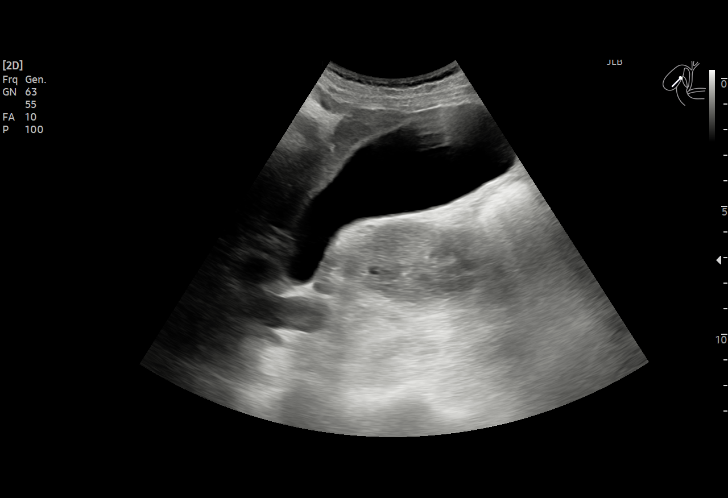
[im 9/41]
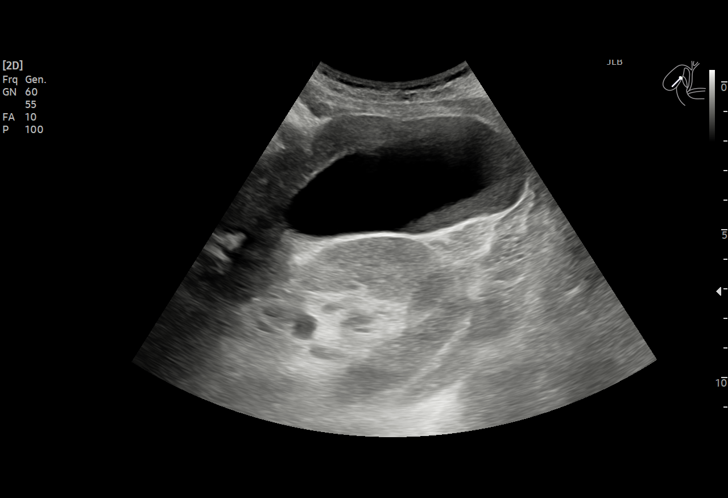
[im 12/41]
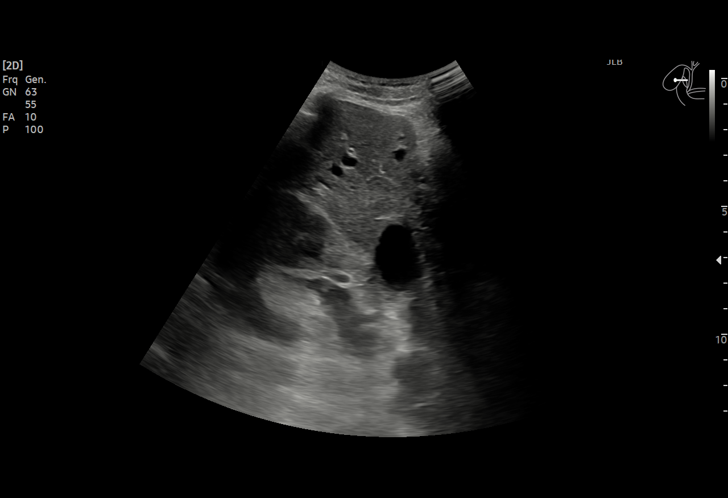
[im 16/41]
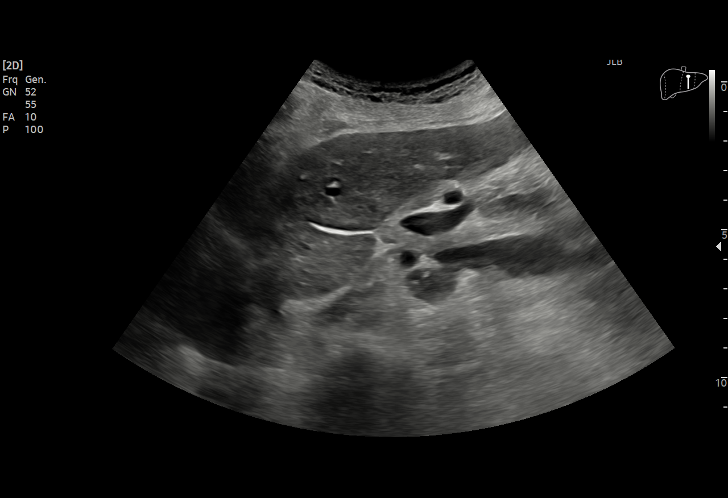
[im 17/41]
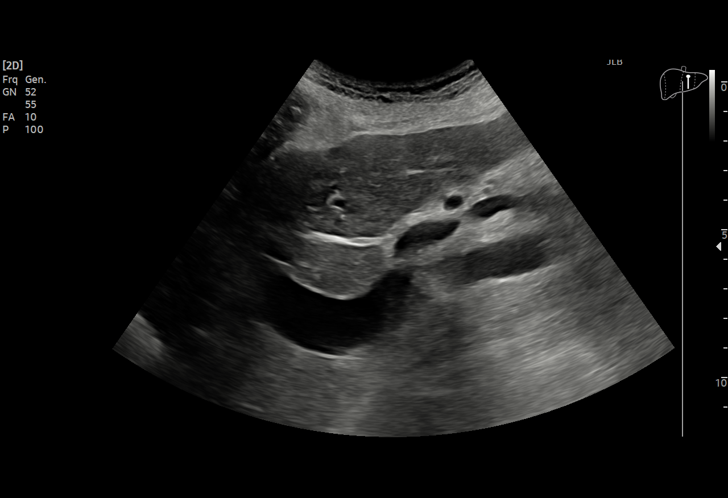
[im 21/41]
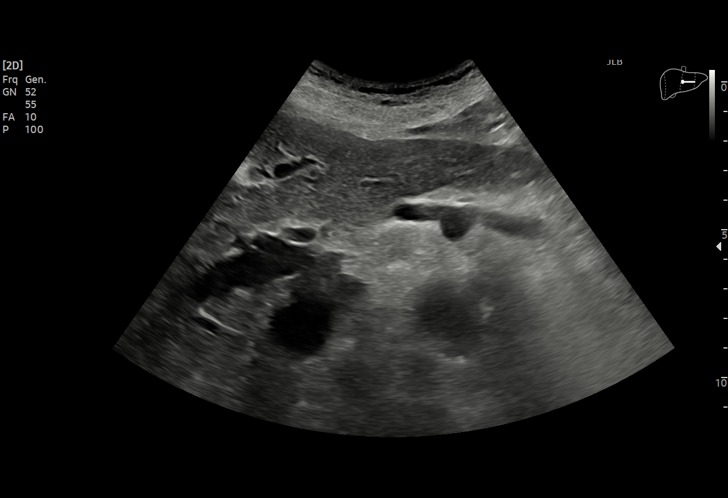
[im 24/41]
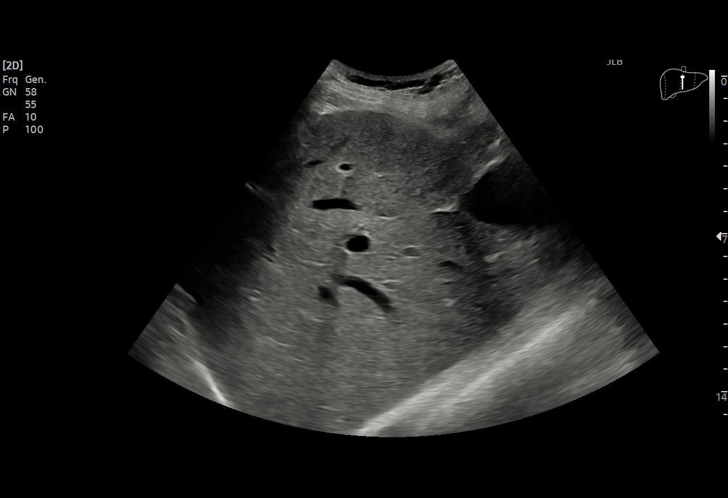
[im 26/41]
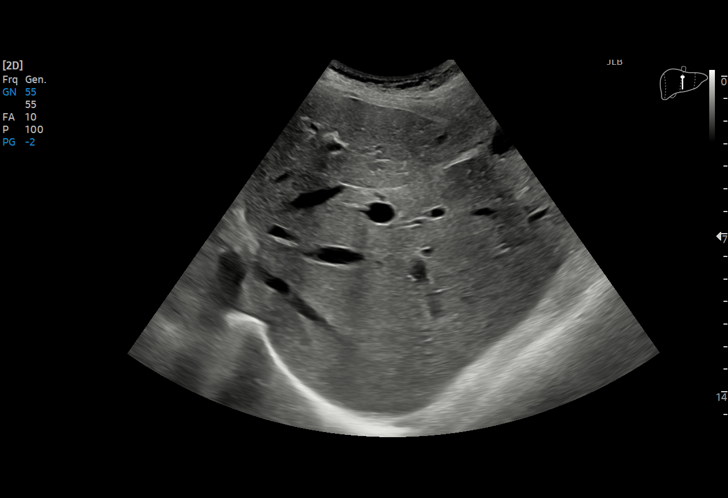
[im 29/41]
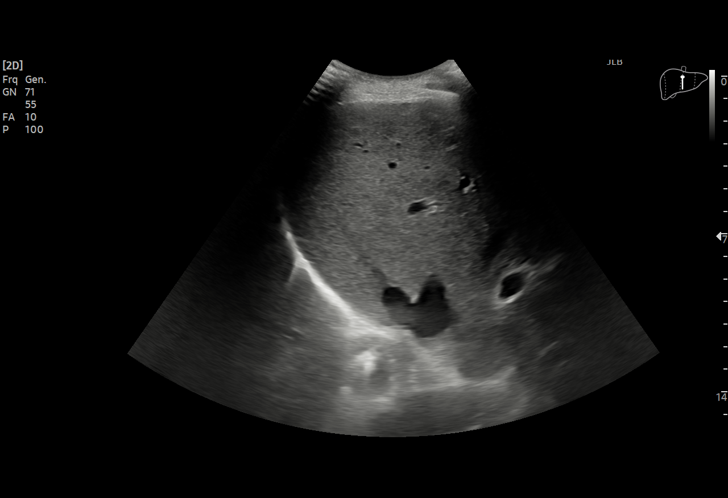
[im 32/41]
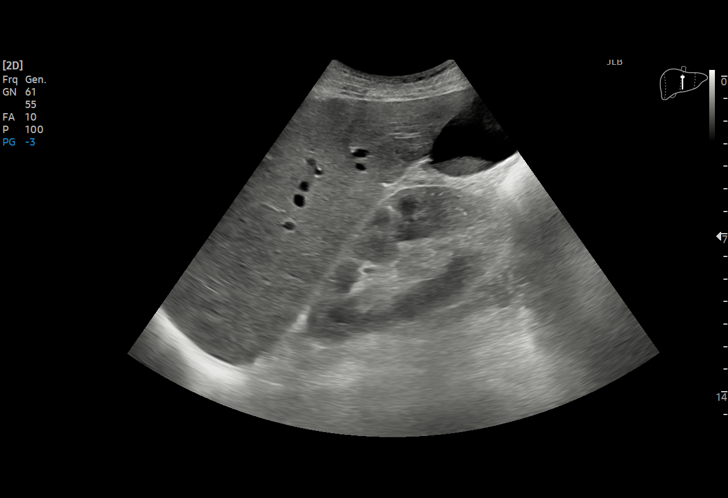
[im 34/41]
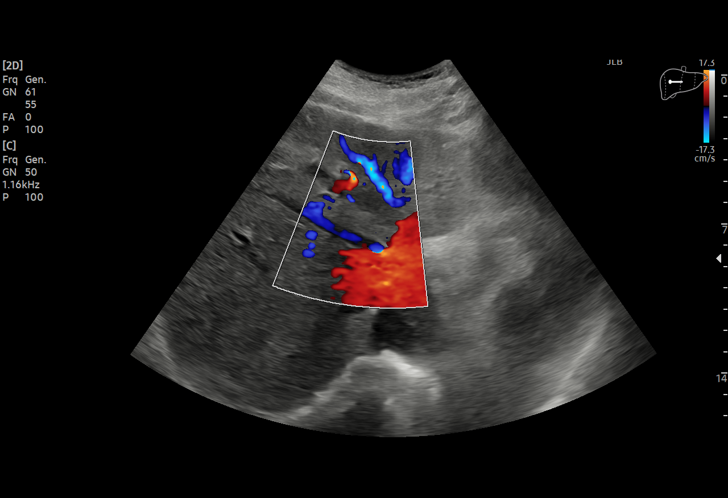
[im 37/41]
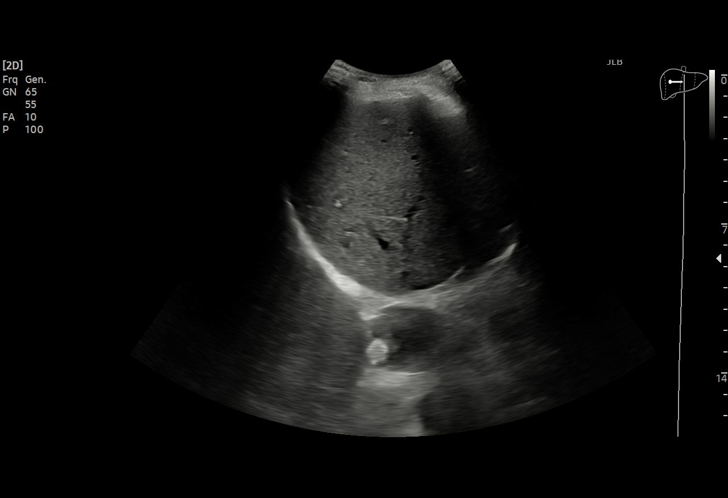
[im 41/41]
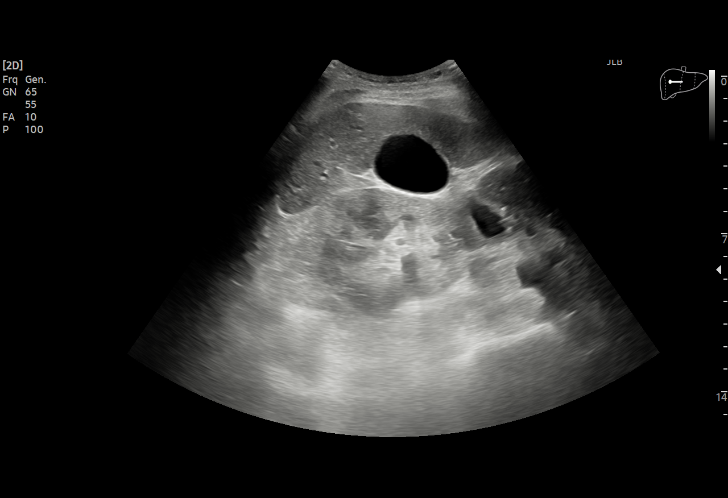

[15 of 25 positions shown; findings below may reference images not displayed]

FINDINGS: Gallbladder:

No gallstones or wall thickening visualized. No sonographic Murphy
sign noted by sonographer.

Common bile duct:

Diameter: 7.5 mm, upper normal

Liver:

No focal lesion identified. Within normal limits in parenchymal
echogenicity. Portal vein is patent on color Doppler imaging with
normal direction of blood flow towards the liver.

Other: None.
IMPRESSION: Negative for gallstones. Common bile duct upper normal. No liver
lesion.

## 2020-11-08 IMAGING — CT CT ABD-PELV W/O CM
2 of 4 series · 15 of 46 positions shown, 17 images · non-contrast
Comparison: None.

CLINICAL DATA: Abdominal pain and fever

EXAM:
CT ABDOMEN AND PELVIS WITHOUT CONTRAST
TECHNIQUE: Multidetector CT imaging of the abdomen and pelvis was performed
following the standard protocol without oral or IV contrast.

[Series 2: axial st · axial · 0.75mm/px · z∈[-658,-188]mm · 12 of 104 slices shown, 14 images]
[im 5/104  soft-tissue]
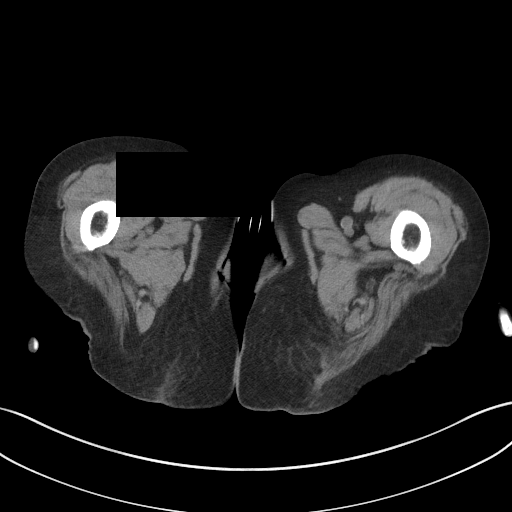
[im 5/104  bone]
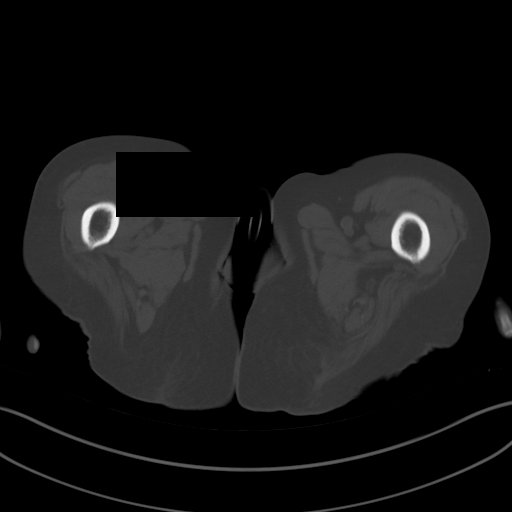
[im 14/104  soft-tissue]
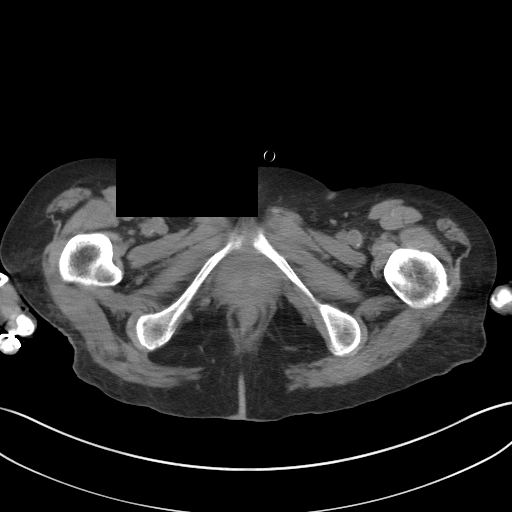
[im 23/104  soft-tissue]
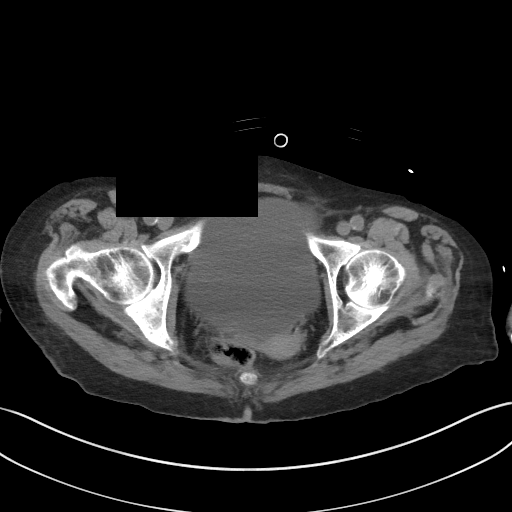
[im 32/104  soft-tissue]
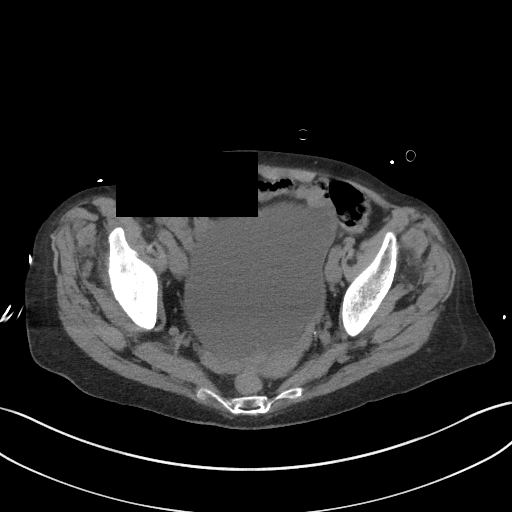
[im 41/104  soft-tissue]
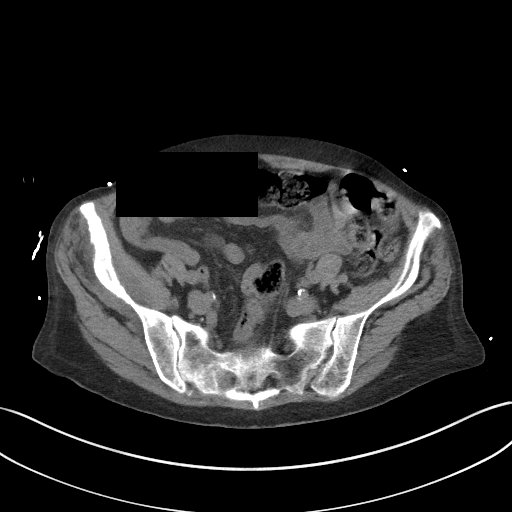
[im 50/104  soft-tissue]
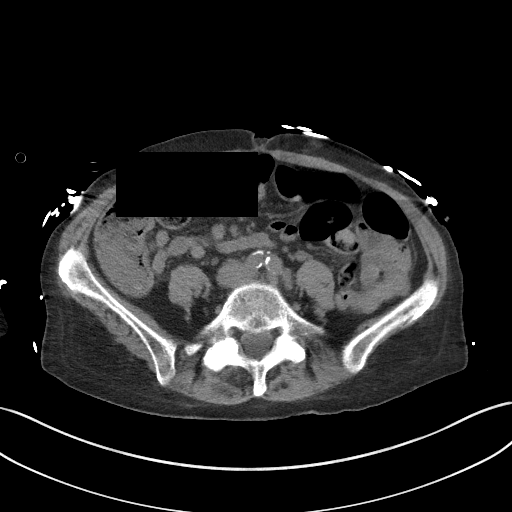
[im 54/104  soft-tissue]
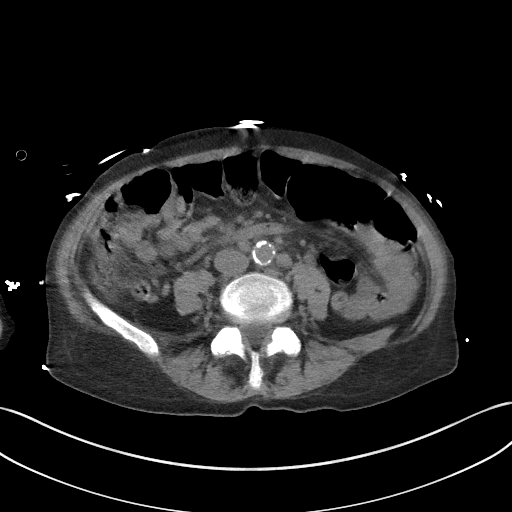
[im 63/104  soft-tissue]
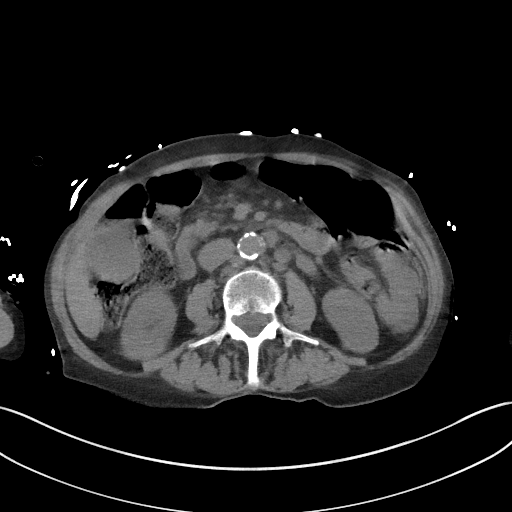
[im 72/104  soft-tissue]
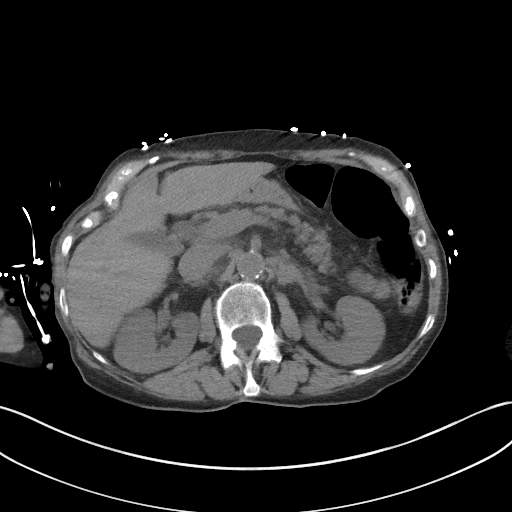
[im 72/104  bone]
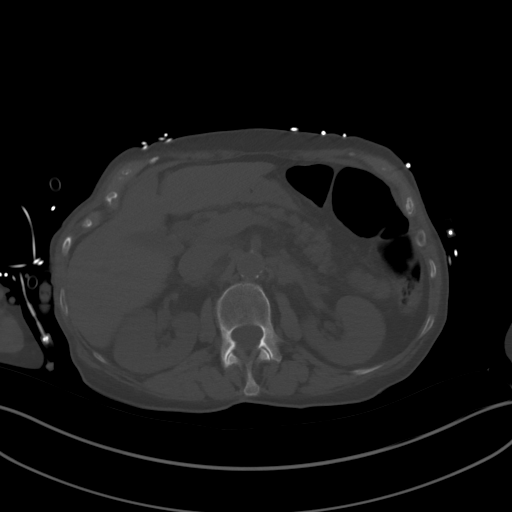
[im 81/104  soft-tissue]
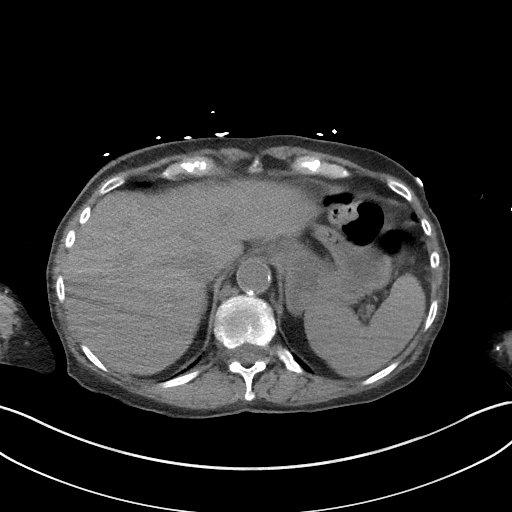
[im 90/104  soft-tissue]
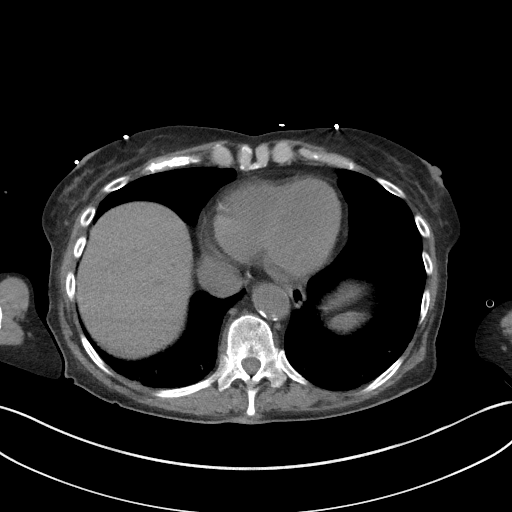
[im 99/104  soft-tissue]
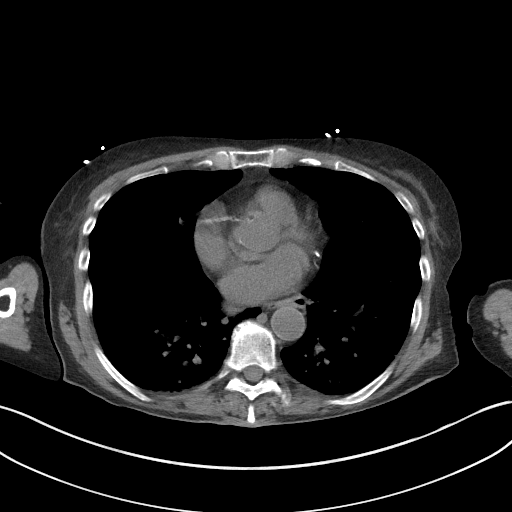

[Series 5: coronal st · coronal · 0.81mm/px · 3 of 78 slices shown]
[im 26/78  soft-tissue]
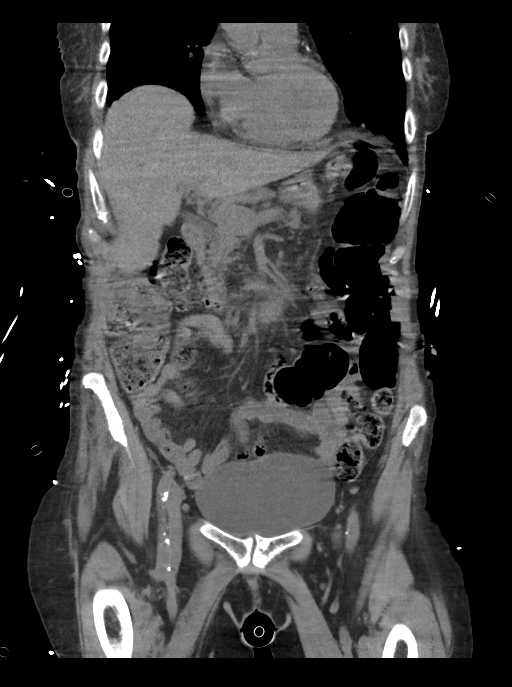
[im 35/78  soft-tissue]
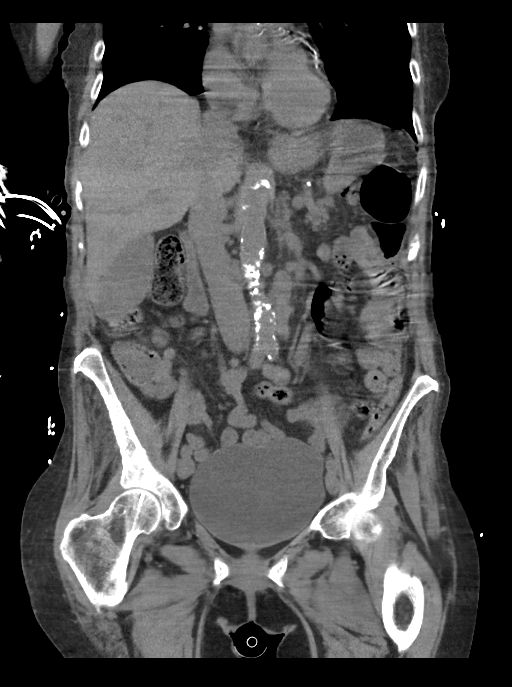
[im 43/78  soft-tissue]
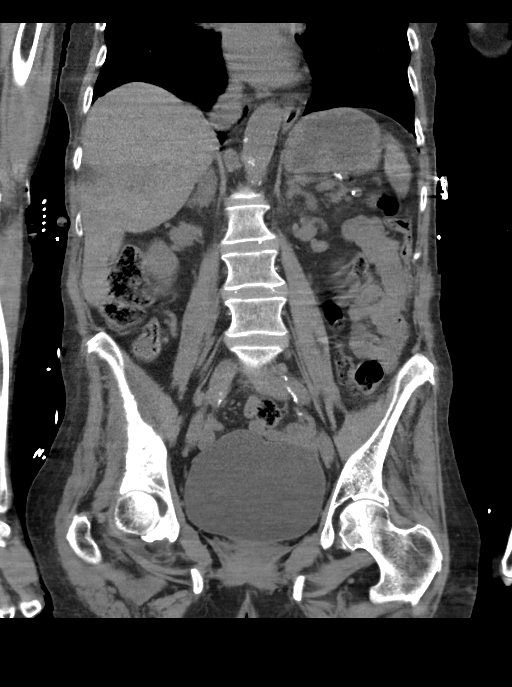

[15 of 46 positions shown; findings below may reference images not displayed]

FINDINGS: Lower chest: There is bibasilar atelectatic change. No consolidation
in the lung bases. There are foci of coronary artery calcification.

Hepatobiliary: No focal liver lesions are appreciable. The
gallbladder appears distended. Gallbladder wall does not appear
appreciably thickened by CT. No biliary duct dilatation evident.

Pancreas: There is no pancreatic mass or inflammatory focus.

Spleen: No splenic lesions are evident.

Adrenals/Urinary Tract: There is mild adrenal hypertrophy
bilaterally. No focal adrenal lesions evident. There is no
appreciable renal mass or hydronephrosis on either side. There is no
evident renal or ureteral calculus on either side. The urinary
bladder appears distended without wall thickening.

Stomach/Bowel: There is no appreciable bowel wall or mesenteric
thickening. There is moderate stool and air in the colon. There is
no evident bowel obstruction. Terminal ileum appears unremarkable.
Appendix appears normal. No evident free air or portal venous air.

Vascular/Lymphatic: There is no abdominal aortic aneurysm. There is
extensive aortic and iliac artery atherosclerotic calcification. No
adenopathy is evident in the abdomen or pelvis.

Reproductive: Uterus is mildly canted to the left. No adnexal masses
are evident.

Other: No evident abscess or ascites in the abdomen or pelvis.

Musculoskeletal: Relative sclerosis noted in the right iliac bone in
a portion of the right acetabulum with mildly irregular periosteum,
likely due to Paget's disease. No lytic or destructive lesions are
evident. There is degenerative change in each pubic symphysis. No
intramuscular lesions evident.
IMPRESSION: 1. Suspected Paget's disease involving the right iliac bone and
acetabulum. Note that there is mildly irregular periosteum in this
area. An atypical bony neoplasm could potentially present in this
manner. Given this appearance of the periosteum in this area,
correlation with MR of the right iliac crest and acetabulum is
advised to further evaluate.

2. Gallbladder is mildly distended without overt wall thickening by
CT. No biliary duct dilatation. Correlation with ultrasound of the
gallbladder may be advisable in this circumstance.

3. Urinary bladder distended without wall thickening. No renal or
ureteral calculus. No hydronephrosis on either side.

4. No evident bowel obstruction. No abscess in the abdomen or
pelvis. Appendix region appears normal.

5. Aortic Atherosclerosis ([X4]-[X4]). Foci of coronary artery and
iliac artery atherosclerotic calcification noted.

6. Mild adrenal hypertrophy bilaterally without focal adrenal
lesion. Significance of mild adrenal hypertrophy is uncertain in
this age group.

## 2020-11-08 MED ORDER — CLONAZEPAM 0.5 MG PO TABS
0.5000 mg | ORAL_TABLET | Freq: Three times a day (TID) | ORAL | Status: DC
  Administered 2020-11-08 – 2020-11-11 (×9): 0.5 mg via ORAL
  Filled 2020-11-08 (×9): qty 1

## 2020-11-08 MED ORDER — ACETAMINOPHEN 325 MG PO TABS
650.0000 mg | ORAL_TABLET | Freq: Four times a day (QID) | ORAL | Status: DC | PRN
  Administered 2020-11-11 – 2020-11-14 (×2): 650 mg via ORAL
  Filled 2020-11-08 (×3): qty 2

## 2020-11-08 MED ORDER — LITHIUM CARBONATE 150 MG PO CAPS
150.0000 mg | ORAL_CAPSULE | Freq: Two times a day (BID) | ORAL | Status: DC
  Administered 2020-11-08 – 2020-11-15 (×13): 150 mg via ORAL
  Filled 2020-11-08 (×16): qty 1

## 2020-11-08 MED ORDER — BUPROPION HCL ER (XL) 150 MG PO TB24
150.0000 mg | ORAL_TABLET | ORAL | Status: DC
  Administered 2020-11-09 – 2020-11-15 (×7): 150 mg via ORAL
  Filled 2020-11-08 (×7): qty 1

## 2020-11-08 MED ORDER — SODIUM CHLORIDE 0.9 % IV SOLN
1.0000 g | Freq: Once | INTRAVENOUS | Status: AC
  Administered 2020-11-08: 1 g via INTRAVENOUS
  Filled 2020-11-08: qty 10

## 2020-11-08 MED ORDER — GUAIFENESIN 100 MG/5ML PO SOLN
300.0000 mg | Freq: Four times a day (QID) | ORAL | Status: DC | PRN
  Filled 2020-11-08: qty 15

## 2020-11-08 MED ORDER — CHLORHEXIDINE GLUCONATE CLOTH 2 % EX PADS
6.0000 | MEDICATED_PAD | Freq: Every day | CUTANEOUS | Status: DC
  Administered 2020-11-09: 6 via TOPICAL

## 2020-11-08 MED ORDER — MAGNESIUM HYDROXIDE 400 MG/5ML PO SUSP
30.0000 mL | Freq: Every day | ORAL | Status: DC | PRN
  Administered 2020-11-11 – 2020-11-12 (×2): 30 mL via ORAL
  Filled 2020-11-08 (×2): qty 30

## 2020-11-08 MED ORDER — TRAZODONE HCL 50 MG PO TABS
50.0000 mg | ORAL_TABLET | Freq: Every day | ORAL | Status: DC
  Administered 2020-11-08 – 2020-11-14 (×7): 50 mg via ORAL
  Filled 2020-11-08 (×7): qty 1

## 2020-11-08 MED ORDER — ENOXAPARIN SODIUM 30 MG/0.3ML ~~LOC~~ SOLN
30.0000 mg | SUBCUTANEOUS | Status: DC
  Administered 2020-11-08: 30 mg via SUBCUTANEOUS
  Filled 2020-11-08 (×2): qty 0.3

## 2020-11-08 MED ORDER — DIFLUPREDNATE 0.05 % OP EMUL: 1.0000 [drp] | Freq: Two times a day (BID) | OPHTHALMIC | Status: DC

## 2020-11-08 MED ORDER — ATORVASTATIN CALCIUM 20 MG PO TABS
20.0000 mg | ORAL_TABLET | Freq: Every evening | ORAL | Status: DC
  Administered 2020-11-08 – 2020-11-14 (×7): 20 mg via ORAL
  Filled 2020-11-08 (×8): qty 1

## 2020-11-08 MED ORDER — SODIUM CHLORIDE 0.9 % IV BOLUS
1000.0000 mL | Freq: Once | INTRAVENOUS | Status: AC
  Administered 2020-11-08: 1000 mL via INTRAVENOUS

## 2020-11-08 MED ORDER — LACTINEX PO CHEW
1.0000 | CHEWABLE_TABLET | Freq: Every day | ORAL | Status: DC
  Filled 2020-11-08 (×7): qty 1

## 2020-11-08 MED ORDER — LOPERAMIDE HCL 2 MG PO CAPS: 4.0000 mg | ORAL_CAPSULE | ORAL | Status: DC | PRN

## 2020-11-08 MED ORDER — LORATADINE 10 MG PO TABS
10.0000 mg | ORAL_TABLET | Freq: Every day | ORAL | Status: DC
  Administered 2020-11-09 – 2020-11-15 (×7): 10 mg via ORAL
  Filled 2020-11-08 (×7): qty 1

## 2020-11-08 MED ORDER — SODIUM CHLORIDE 0.9 % IV BOLUS
500.0000 mL | Freq: Once | INTRAVENOUS | Status: AC
  Administered 2020-11-08: 500 mL via INTRAVENOUS

## 2020-11-08 MED ORDER — SODIUM CHLORIDE 0.9 % IV SOLN
1.0000 g | INTRAVENOUS | Status: DC
  Administered 2020-11-09 – 2020-11-11 (×3): 1 g via INTRAVENOUS
  Filled 2020-11-08 (×3): qty 10

## 2020-11-08 MED ORDER — VITAMIN D3 25 MCG (1000 UNIT) PO TABS
2000.0000 [IU] | ORAL_TABLET | Freq: Every morning | ORAL | Status: DC
  Administered 2020-11-09 – 2020-11-15 (×7): 2000 [IU] via ORAL
  Filled 2020-11-08 (×9): qty 2

## 2020-11-08 MED ORDER — SODIUM CHLORIDE 0.9 % IV SOLN: INTRAVENOUS | Status: DC

## 2020-11-08 MED ORDER — MORPHINE SULFATE (PF) 2 MG/ML IV SOLN
0.5000 mg | INTRAVENOUS | Status: DC | PRN
  Administered 2020-11-14: 0.5 mg via INTRAVENOUS
  Filled 2020-11-08: qty 1

## 2020-11-08 MED ORDER — ONDANSETRON HCL 4 MG/2ML IJ SOLN: 4.0000 mg | Freq: Three times a day (TID) | INTRAMUSCULAR | Status: DC | PRN

## 2020-11-08 MED ORDER — CRANBERRY 250 MG PO CAPS: 500.0000 mg | ORAL_CAPSULE | Freq: Every day | ORAL | Status: DC

## 2020-11-08 MED ORDER — SODIUM CHLORIDE 0.9 % IV SOLN: Freq: Once | INTRAVENOUS | Status: DC

## 2020-11-08 MED ORDER — BENZTROPINE MESYLATE 0.5 MG PO TABS
0.5000 mg | ORAL_TABLET | Freq: Two times a day (BID) | ORAL | Status: DC
  Administered 2020-11-08 – 2020-11-15 (×14): 0.5 mg via ORAL
  Filled 2020-11-08 (×15): qty 1

## 2020-11-08 NOTE — ED Triage Notes (Signed)
Pt to ER via EMS from [place] with c/o RUQ abdominal pain and low grade fever.  No n/v/d reported.  Pt with hx of bipolar and schizophrenia at baseline.

## 2020-11-08 NOTE — ED Notes (Signed)
Sandwich tray and coffee provided. Diet okayed per Dr. Blaine Hamper.

## 2020-11-08 NOTE — ED Notes (Signed)
Bladder scan shows >761ml, Dr. Quentin Cornwall aware, new orders received.

## 2020-11-08 NOTE — H&P (Addendum)
History and Physical    Danielle Steele CZY:[id number] DOB: August 22, 1940 DOA: 11/08/2020  Referring MD/NP/PA:   PCP: Abby Potash, PA-C   Patient coming from:  The patient is coming from SNF.  At baseline, pt is dependent for most of ADL.        Chief Complaint: Abdominal pain, AMS, hypotension  HPI: Danielle Steele is a 80 y.o. female with medical history significant of hypertension, hyperlipidemia, prediabetes, GERD, depression, anxiety, tremor, schizophrenia, bipolar, who presents with abdominal pain, hypotension and altered mental status.  Per her brother at the bedside, at her normal baseline, patient is mostly alert, orientated x3.  Patient is more confused today. When I saw pt in ED, she is alert, orientated to person and place, but not to the time.  She moves all extremities normally.  No facial droop or slurred speech.  Initially patient complains of right upper quadrant abdominal pain, but she told me that she has abdominal pain in the middle abdomen.  No nausea, vomiting or diarrhea.  No fever or chills.  Patient was reportedly to have hypotension in facility with SBP 70s.  Patient received 500 cc normal saline by EMS.  Her blood pressure is 92/45 after giving 1.5 L of normal saline in ED. Patient does not have chest pain, cough, shortness breath.  Per her brother, patient is taking Bactrim for UTI currently.  She cannot tell if she has dysuria or burning on urination.  Her brother states that patient had fall 2 months ago and again 2 weeks ago. Bladder scan showed >750 cc of urine in ED. Foley cath is placed.  ED Course: pt was found to have WBC 24.1, urinalysis (hazy appearance, negative leukocyte, negative bacteria, WBC 6-10, red blood cell> 50), lithium level 0.79 which is therapeutic, normal liver function, lactic acid 1.9, INR 1.2, PTT 32, AKI with creatinine 1.57, BUN 25 (creatinine 0.92 on 10/10/2020), temperature 99.2, heart rate 64, RR 24, oxygen saturation 98-100% on  room air.  Patient is admitted to [facility] bed as inpatient  US-RUQ: Negative for gallstones. Common bile duct upper normal. No liver lesion.  CT abdomen/pelvis: 1. 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 Note that there is mildly irregular periosteum in this area. An atypical bony neoplasm could potentially present in this manner. Given this appearance of the periosteum in this area, correlation with MR of the right iliac crest and acetabulum is advised to further evaluate.  2. Gallbladder is mildly distended without overt wall thickening by CT. No biliary duct dilatation. Correlation with ultrasound of the gallbladder may be advisable in this circumstance.  3. Urinary bladder distended without wall thickening. No renal or ureteral calculus. No hydronephrosis on either side.  4. No evident bowel obstruction. No abscess in the abdomen or pelvis. Appendix region appears normal.  5. Aortic Atherosclerosis (ICD10-I70.0). Foci of coronary artery and iliac artery atherosclerotic calcification noted.  6. Mild adrenal hypertrophy bilaterally without focal adrenal lesion. Significance of mild adrenal hypertrophy is uncertain in this age group.    Review of Systems: Could not reviewed accurately due to altered mental status  Allergy:  Allergies  Allergen Reactions  . Citrus Rash    Past Medical History:  Diagnosis Date  . Anxiety   . Bipolar disorder ([place])   . Depression   . GERD (gastroesophageal reflux disease)   . Hypertension   . PND (post-nasal drip)    CAUSES SOME COUGH  . Pre-diabetes   . Schizophrenia ([place])  SCHIZO  AFFECTIVE DISORDER  . Thyroid nodule   . Tremors of nervous system     Past Surgical History:  Procedure Laterality Date  . CATARACT EXTRACTION W/PHACO Left 09/07/2017   Procedure: CATARACT EXTRACTION PHACO AND INTRAOCULAR LENS PLACEMENT (IOC);  Surgeon: Birder Robson, MD;  Location: [facility] ORS;  Service:  Ophthalmology;  Laterality: Left;  [place] 00:39.9 AP% 17.4 CDE 6.94 Fluid Pack lot # [id number]  . CATARACT EXTRACTION W/PHACO Right 09/28/2017   Procedure: CATARACT EXTRACTION PHACO AND INTRAOCULAR LENS PLACEMENT (IOC);  Surgeon: Birder Robson, MD;  Location: [facility] ORS;  Service: Ophthalmology;  Laterality: Right;  [place] 01:31.8 AP% 13.7 CDE 12.55 Fluid Pack Lot # [id number]   . EYE SURGERY    . TONSILLECTOMY      Social History:  reports that she has quit smoking. She has never used smokeless tobacco. She reports that she does not drink alcohol and does not use drugs.  Family History: History reviewed. No pertinent family history.  Could not be reviewed accurately due to altered mental status.  Prior to Admission medications   Medication Sig Start Date End Date Taking? Authorizing Provider  acetaminophen (TYLENOL) 325 MG tablet Take 650 mg by mouth every 4 (four) hours as needed for mild pain or moderate pain.   Yes [provider]  atorvastatin (LIPITOR) 20 MG tablet Take 20 mg by mouth every evening.   Yes [provider]  benztropine (COGENTIN) 0.5 MG tablet Take 0.5 mg by mouth 2 (two) times daily.   Yes [provider]  buPROPion (WELLBUTRIN XL) 150 MG 24 hr tablet Take 150 mg by mouth every morning.   Yes [provider]  cetirizine (ZYRTEC) 10 MG tablet Take 10 mg by mouth daily. 10/28/20  Yes [provider]  Cholecalciferol ([employer] VITAMIN D3) 25 MCG (1000 UT) CHEW Chew 2,000 Units by mouth in the morning.   Yes [provider]  clonazePAM (KLONOPIN) 0.5 MG tablet Take 0.5 mg by mouth 3 (three) times daily.   Yes [provider]  Cranberry 250 MG CAPS Take 500 mg by mouth daily.    Yes [provider]  diclofenac Sodium (VOLTAREN) 1 % GEL Apply topically. 10/04/20  Yes [provider]  fluPHENAZine decanoate (PROLIXIN) 25 MG/ML injection Inject 25 mg into the muscle every 30 (thirty) days.   Yes [provider]  lactobacilus acidophilus & bulgar (FLORANEX) TABS chewable tablet Take 1 tablet by mouth daily. 10/28/20  Yes [provider]  lisinopril (PRINIVIL,ZESTRIL) 5 MG tablet Take 5 mg by mouth at bedtime.   Yes [provider]  lithium carbonate 150 MG capsule Take 150 mg by mouth 2 (two) times daily with a meal.   Yes [provider]  sulfamethoxazole-trimethoprim (BACTRIM DS) 800-160 MG tablet Take 1 tablet by mouth 2 (two) times daily. 10/31/20  Yes [provider]  traZODone (DESYREL) 50 MG tablet Take 50 mg by mouth at bedtime.   Yes [provider]  barrier cream (NON-SPECIFIED) CREA Apply 1 application topically as needed (apply topically after toileting as needed to prevent skin breakdown).    [provider]  Camphor-Menthol (MEN-PHOR EX) Apply 1 application topically 2 (two) times daily as needed.    [provider]  camphor-menthol Timoteo Ace) lotion Apply 1 application topically 2 (two) times daily. Apply to back    [provider]  cholecalciferol (VITAMIN D) 1000 units tablet Take 2,000 Units by mouth daily.  Patient not taking: Reported on 11/08/2020  [provider]  Difluprednate (DUREZOL) 0.05 % EMUL Apply 1 drop to eye 2 (two) times daily.    [provider]  guaiFENesin (ROBITUSSIN) 100 MG/5ML liquid Take 300 mg by mouth every 6 (six) hours as needed for cough.     [provider]  ketoconazole (NIZORAL) 2 % cream Apply 1 application topically 2 (two) times daily.    [provider]  lidocaine (LIDODERM) 5 % Place 1 patch onto the skin every 12 (twelve) hours. Remove & Discard patch within 12 hours or as directed by MD Patient not taking: Reported on 11/08/2020 05/30/20 05/30/21  Sable Feil, PA-C  loperamide (IMODIUM A-D) 2 MG tablet Take 4 mg by mouth as needed for diarrhea or loose stools (No more than 8 doses in 24 hours).     [provider]  magnesium  hydroxide (MILK OF MAGNESIA) 400 MG/5ML suspension Take 30 mLs by mouth daily as needed for mild constipation.    [provider]  saccharomyces boulardii (FLORASTOR) 250 MG capsule Take 250 mg by mouth daily. Patient not taking: Reported on 11/08/2020    [provider]    Physical Exam: Vitals:   11/08/20 1230 11/08/20 1245 11/08/20 1335 11/08/20 1444  BP: 99/62   (!) 95/48  Pulse: 64 64  69  Resp: (!) 21 (!) 24  16  Temp:   99.4 F (37.4 C) 98.1 F (36.7 C)  TempSrc:   Rectal   SpO2: 99% 100%  97%  Weight:      Height:       General: Not in acute distress HEENT:       Eyes: PERRL, EOMI, no scleral icterus.       ENT: No discharge from the ears and nose       Neck: No JVD, no bruit, no mass felt. Heme: No neck lymph node enlargement. Cardiac: S1/S2, RRR, No murmurs, No gallops or rubs. Respiratory: No rales, wheezing, rhonchi or rubs. GI: Soft, nondistended, has tenderness in the central abdomen, no tenderness in the right upper quadrant, no rebound pain, no organomegaly, BS present. GU: No hematuria Ext: No pitting leg edema bilaterally. 1+DP/PT pulse bilaterally. Musculoskeletal: No joint deformities, No joint redness or warmth, no limitation of ROM in spin. Skin: No rashes.  Neuro: Confused, oriented to the place and person, not to time, cranial nerves II-XII grossly intact, moves all extremities normally. Psych: Patient is not psychotic, no suicidal or hemocidal ideation.  Labs on Admission: I have personally reviewed following labs and imaging studies  CBC: Recent Labs  Lab 11/08/20 0910  WBC 24.1*  NEUTROABS 19.1*  HGB 10.3*  HCT 31.8*  MCV 95.5  PLT 149   Basic Metabolic Panel: Recent Labs  Lab 11/08/20 0910  NA 132*  K 4.2  CL 103  CO2 21*  GLUCOSE 108*  BUN 25*  CREATININE 1.57*  CALCIUM 9.5   GFR: Estimated Creatinine Clearance: 23.3 mL/min (A) (by C-G formula based on SCr of 1.57 mg/dL (H)). Liver Function Tests: Recent Labs   Lab 11/08/20 0910  AST 18  ALT 11  ALKPHOS 97  BILITOT 0.6  PROT 6.2*  ALBUMIN 2.7*   No results for input(s): LIPASE, AMYLASE in the last 168 hours. No results for input(s): AMMONIA in the last 168 hours. Coagulation Profile: Recent Labs  Lab 11/08/20 0910  INR 1.2   Cardiac Enzymes: No results for input(s): CKTOTAL, CKMB, CKMBINDEX, TROPONINI in the last 168 hours. BNP (last 3 results) No  results for input(s): PROBNP in the last 8760 hours. HbA1C: No results for input(s): HGBA1C in the last 72 hours. CBG: No results for input(s): GLUCAP in the last 168 hours. Lipid Profile: No results for input(s): CHOL, HDL, LDLCALC, TRIG, CHOLHDL, LDLDIRECT in the last 72 hours. Thyroid Function Tests: No results for input(s): TSH, T4TOTAL, FREET4, T3FREE, THYROIDAB in the last 72 hours. Anemia Panel: No results for input(s): VITAMINB12, FOLATE, FERRITIN, TIBC, IRON, RETICCTPCT in the last 72 hours. Urine analysis:    Component Value Date/Time   COLORURINE YELLOW (A) 11/08/2020 0913   APPEARANCEUR HAZY (A) 11/08/2020 0913   LABSPEC 1.012 11/08/2020 0913   PHURINE 6.0 11/08/2020 0913   GLUCOSEU NEGATIVE 11/08/2020 0913   HGBUR LARGE (A) 11/08/2020 0913   BILIRUBINUR NEGATIVE 11/08/2020 0913   KETONESUR NEGATIVE 11/08/2020 0913   PROTEINUR NEGATIVE 11/08/2020 0913   NITRITE NEGATIVE 11/08/2020 0913   LEUKOCYTESUR NEGATIVE 11/08/2020 0913   Sepsis Labs: @LABRCNTIP (procalcitonin:4,lacticidven:4) ) Recent Results (from the past 240 hour(s))  Resp Panel by RT-PCR (Flu A&B, Covid) Nasopharyngeal Swab     Status: None   Collection Time: 11/08/20 10:34 AM   Specimen: Nasopharyngeal Swab; Nasopharyngeal(NP) swabs in vial transport medium  Result Value Ref Range Status   SARS Coronavirus 2 by RT PCR NEGATIVE NEGATIVE Final    Comment: (NOTE) SARS-CoV-2 target nucleic acids are NOT DETECTED.  The SARS-CoV-2 RNA is generally detectable in upper respiratory specimens during the  acute phase of infection. The lowest concentration of SARS-CoV-2 viral copies this assay can detect is 138 copies/mL. A negative result does not preclude SARS-Cov-2 infection and should not be used as the sole basis for treatment or other patient management decisions. A negative result may occur with  improper specimen collection/handling, submission of specimen other than nasopharyngeal swab, presence of viral mutation(s) within the areas targeted by this assay, and inadequate number of viral copies(<138 copies/mL). A negative result must be combined with clinical observations, patient history, and epidemiological information. The expected result is Negative.  Fact Sheet for Patients:  EntrepreneurPulse.com.au  Fact Sheet for Healthcare Providers:  IncredibleEmployment.be  This test is no t yet approved or cleared by the [place] FDA and  has been authorized for detection and/or diagnosis of SARS-CoV-2 by FDA under an Emergency Use Authorization (EUA). This EUA will remain  in effect (meaning this test can be used) for the duration of the COVID-19 declaration under Section 564(b)(1) of the Act, 21 U.S.C.section 360bbb-3(b)(1), unless the authorization is terminated  or revoked sooner.       Influenza A by PCR NEGATIVE NEGATIVE Final   Influenza B by PCR NEGATIVE NEGATIVE Final    Comment: (NOTE) The Xpert Xpress SARS-CoV-2/FLU/RSV plus assay is intended as an aid in the diagnosis of influenza from Nasopharyngeal swab specimens and should not be used as a sole basis for treatment. Nasal washings and aspirates are unacceptable for Xpert Xpress SARS-CoV-2/FLU/RSV testing.  Fact Sheet for Patients: EntrepreneurPulse.com.au  Fact Sheet for Healthcare Providers: IncredibleEmployment.be  This test is not yet approved or cleared by the [place] FDA and has been authorized for detection and/or  diagnosis of SARS-CoV-2 by FDA under an Emergency Use Authorization (EUA). This EUA will remain in effect (meaning this test can be used) for the duration of the COVID-19 declaration under Section 564(b)(1) of the Act, 21 U.S.C. section 360bbb-3(b)(1), unless the authorization is terminated or revoked.  Performed at [facility], [address] [place], [place] [address]  Radiological Exams on Admission: CT ABDOMEN PELVIS WO CONTRAST  Result Date: 11/08/2020 CLINICAL DATA:  Abdominal pain and fever EXAM: CT ABDOMEN AND PELVIS WITHOUT CONTRAST TECHNIQUE: Multidetector CT imaging of the abdomen and pelvis was performed following the standard protocol without oral or IV contrast. COMPARISON:  None. FINDINGS: Lower chest: There is bibasilar atelectatic change. No consolidation in the lung bases. There are foci of coronary artery calcification. Hepatobiliary: No focal liver lesions are appreciable. The gallbladder appears distended. Gallbladder wall does not appear appreciably thickened by CT. No biliary duct dilatation evident. Pancreas: There is no pancreatic mass or inflammatory focus. Spleen: No splenic lesions are evident. Adrenals/Urinary Tract: There is mild adrenal hypertrophy bilaterally. No focal adrenal lesions evident. There is no appreciable renal mass or hydronephrosis on either side. There is no evident renal or ureteral calculus on either side. The urinary bladder appears distended without wall thickening. Stomach/Bowel: There is no appreciable bowel wall or mesenteric thickening. There is moderate stool and air in the colon. There is no evident bowel obstruction. Terminal ileum appears unremarkable. Appendix appears normal. No evident free air or portal venous air. Vascular/Lymphatic: There is no abdominal aortic aneurysm. There is extensive aortic and iliac artery atherosclerotic calcification. No adenopathy is evident in the abdomen or pelvis. Reproductive: Uterus is mildly  canted to the left. No adnexal masses are evident. Other: No evident abscess or ascites in the abdomen or pelvis. Musculoskeletal: Relative sclerosis noted in the right iliac bone in a portion of the right acetabulum with mildly irregular periosteum, likely due to Paget's disease. No lytic or destructive lesions are evident. There is degenerative change in each pubic symphysis. No intramuscular lesions evident. IMPRESSION: 1. 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 Note that there is mildly irregular periosteum in this area. An atypical bony neoplasm could potentially present in this manner. Given this appearance of the periosteum in this area, correlation with MR of the right iliac crest and acetabulum is advised to further evaluate. 2. Gallbladder is mildly distended without overt wall thickening by CT. No biliary duct dilatation. Correlation with ultrasound of the gallbladder may be advisable in this circumstance. 3. Urinary bladder distended without wall thickening. No renal or ureteral calculus. No hydronephrosis on either side. 4. No evident bowel obstruction. No abscess in the abdomen or pelvis. Appendix region appears normal. 5. Aortic Atherosclerosis (ICD10-I70.0). Foci of coronary artery and iliac artery atherosclerotic calcification noted. 6. Mild adrenal hypertrophy bilaterally without focal adrenal lesion. Significance of mild adrenal hypertrophy is uncertain in this age group. Electronically Signed   By: Lowella Grip III M.D.   On: 11/08/2020 10:30   DG Chest Port 1 View  Result Date: 11/08/2020 CLINICAL DATA:  Questionable sepsis EXAM: PORTABLE CHEST 1 VIEW COMPARISON:  None. FINDINGS: Mild coarsening of lung markings. No focal pneumonia. No edema, effusion, or pneumothorax. Normal heart size and mediastinal contours. IMPRESSION: 1. No focal pneumonia. 2. Coarsened lung markings which could be chronic lung disease or atypical infection. Electronically Signed    By: Monte Fantasia M.D.   On: 11/08/2020 09:46   US ABDOMEN LIMITED RUQ (LIVER/GB)  Result Date: 11/08/2020 CLINICAL DATA:  Right upper quadrant pain EXAM: ULTRASOUND ABDOMEN LIMITED RIGHT UPPER QUADRANT COMPARISON:  None. FINDINGS: Gallbladder: No gallstones or wall thickening visualized. No sonographic Murphy sign noted by sonographer. Common bile duct: Diameter: 7.5 mm, upper normal Liver: No focal lesion identified. Within normal limits in parenchymal echogenicity. Portal vein is patent on color Doppler imaging  with normal direction of blood flow towards the liver. Other: None. IMPRESSION: Negative for gallstones. Common bile duct upper normal. No liver lesion. Electronically Signed   By: Franchot Gallo M.D.   On: 11/08/2020 12:45     EKG: I have personally reviewed.  Sinus rhythm, QTC 405, early R wave progression,nonspecific T wave change   Assessment/Plan Principal Problem:   Abdominal pain Active Problems:   UTI (urinary tract infection)   Sepsis (HCC)   Schizophrenia (HCC)   Bipolar disorder (HCC)   Depression   Anxiety   Hypertension   HLD (hyperlipidemia)   AKI (acute kidney injury) (HCC)   Abnormal CT scan   Acute metabolic encephalopathy   Abdominal pain: Etiology is not clear.  CT scan showed that gallbladder is mildly distended without overt wall thickening by CT. No biliary duct dilatation. US-RUQ negative. LFT normal.  Possibly due to bladder distention and UTI.  -Admitted to MedSurg bed as inpatient -As needed Zofran and morphine -IV fluid  Sepsis due to UTI (urinary tract infection): Patient is currently on Bactrim in SNF.  Patient meets criteria for sepsis with leukocytosis with WBC 24.1, tachypnea with RR 24.  Lactic acid is normal.  Initially hypotensive, blood pressure responded to IV fluid.  Currently blood pressure is 107/72. -Switch Bactrim to Rocephin -f/u Bx and Ux -will get Procalcitonin -IVF: 1.5 L of NS bolus in ED, followed by 125 cc/h    Schizophrenia, Bipolar disorder, Depression and Anxiety: pt is calm now. -Cogentin, Wellbutrin, Klonopin, lithium -Patient is receiving fluphenazine injection every 30 days  Hypertension -Hold lisinopril due to hypotension and AKI  HLD (hyperlipidemia) -Lipitor  AKI (acute kidney injury) ([place]): Likely due to UTI -IV fluid as above -Hold lisinopril -Follow-up by BMP  Acute metabolic encephalopathy: Likely due to UTI -Frequent neuro check -Follow-up CT of head since patient had fall 2 weeks ago   Abnormal CT scan: CT-abd/pelvis --> suspected Paget's disease involving the right iliac bone and acetabulum. Note that there is mildly irregular periosteum in this area. An atypical bony neoplasm could potentially present in this manner. Given this appearance of the periosteum in this area, correlation with MR of the right iliac crest and acetabulum is advised to further evaluate. -will get MRI-pelvis    DVT ppx: SQ Lovenox Code Status: Partial code per her brother (OK for CPR, no intubation) Family Communication: Yes, patient's  brother  at bed side Disposition Plan:  Anticipate discharge back to previous environment Consults called:  none Admission status and Level of care: Med-Surg:    as inpt     Status is: Inpatient  Remains inpatient appropriate because:Inpatient level of care appropriate due to severity of illness   Dispo: The patient is from: SNF              Anticipated d/c is to: SNF              Patient currently is not medically stable to d/c.   Difficult to place patient No          Date of Service 11/08/2020    [facility]   If 7PM-7AM, please contact night-coverage [email or web address] 11/08/2020, 2:53 PM

## 2020-11-08 NOTE — ED Notes (Signed)
Called RN bed assigned [phone]

## 2020-11-08 NOTE — ED Provider Notes (Signed)
[facility] Emergency Department Provider Note    Event Date/Time   First MD Initiated Contact with Patient 11/08/20 [phone]     (approximate)  I have reviewed the triage vital signs and the nursing notes.   HISTORY  Chief Complaint Abdominal Pain and Fever  Level V caveat:  Poor historian  HPI Danielle Steele is a 81 y.o. female below listed past medical history presents to the ER for evaluation of confusion and low blood pressure as well as abdominal pain.   Patient not able to provide much reliable history.  She is denying any pain at this time but does wince when her abdomen is palpated.  Had a low-grade temperature 99 as well as hypotensive at the facility this morning down to the systolics of 19Q she was given half a liter bolus brought to the ER for concern for sepsis.   Past Medical History:  Diagnosis Date  . Anxiety   . Bipolar disorder ([place])   . Depression   . GERD (gastroesophageal reflux disease)   . Hypertension   . PND (post-nasal drip)    CAUSES SOME COUGH  . Pre-diabetes   . Schizophrenia ([place])    SCHIZO  AFFECTIVE DISORDER  . Thyroid nodule   . Tremors of nervous system    History reviewed. No pertinent family history. Past Surgical History:  Procedure Laterality Date  . CATARACT EXTRACTION W/PHACO Left 09/07/2017   Procedure: CATARACT EXTRACTION PHACO AND INTRAOCULAR LENS PLACEMENT (IOC);  Surgeon: Birder Robson, MD;  Location: [facility] ORS;  Service: Ophthalmology;  Laterality: Left;  [place] 00:39.9 AP% 17.4 CDE 6.94 Fluid Pack lot # [id number]  . CATARACT EXTRACTION W/PHACO Right 09/28/2017   Procedure: CATARACT EXTRACTION PHACO AND INTRAOCULAR LENS PLACEMENT (IOC);  Surgeon: Birder Robson, MD;  Location: [facility] ORS;  Service: Ophthalmology;  Laterality: Right;  [place] 01:31.8 AP% 13.7 CDE 12.55 Fluid Pack Lot # [id number]   . EYE SURGERY    . TONSILLECTOMY     Patient Active Problem List   Diagnosis Date Noted  . Abdominal  pain 11/08/2020  . UTI (urinary tract infection) 11/08/2020  . Sepsis ([place]) 11/08/2020  . Schizophrenia ([place])   . Bipolar disorder ([place])   . Depression   . Anxiety   . Hypertension   . HLD (hyperlipidemia)   . AKI (acute kidney injury) ([place])   . Abnormal CT scan       Prior to Admission medications   Medication Sig Start Date End Date Taking? Authorizing Provider  acetaminophen (TYLENOL) 325 MG tablet Take 650 mg by mouth every 4 (four) hours as needed for mild pain or moderate pain.   Yes [provider]  atorvastatin (LIPITOR) 20 MG tablet Take 20 mg by mouth every evening.   Yes [provider]  benztropine (COGENTIN) 0.5 MG tablet Take 0.5 mg by mouth 2 (two) times daily.   Yes [provider]  buPROPion (WELLBUTRIN XL) 150 MG 24 hr tablet Take 150 mg by mouth every morning.   Yes [provider]  cetirizine (ZYRTEC) 10 MG tablet Take 10 mg by mouth daily. 10/28/20  Yes [provider]  Cholecalciferol ([employer] VITAMIN D3) 25 MCG (1000 UT) CHEW Chew 2,000 Units by mouth in the morning.   Yes [provider]  clonazePAM (KLONOPIN) 0.5 MG tablet Take 0.5 mg by mouth 3 (three) times daily.   Yes [provider]  Cranberry 250 MG CAPS Take 500 mg by mouth daily.    Yes  [provider]  diclofenac Sodium (VOLTAREN) 1 % GEL Apply topically. 10/04/20  Yes [provider]  fluPHENAZine decanoate (PROLIXIN) 25 MG/ML injection Inject 25 mg into the muscle every 30 (thirty) days.   Yes [provider]  lactobacilus acidophilus & bulgar (FLORANEX) TABS chewable tablet Take 1 tablet by mouth daily. 10/28/20  Yes [provider]  lisinopril (PRINIVIL,ZESTRIL) 5 MG tablet Take 5 mg by mouth at bedtime.   Yes [provider]  lithium carbonate 150 MG capsule Take 150 mg by mouth 2 (two) times daily with a meal.   Yes [provider]  sulfamethoxazole-trimethoprim (BACTRIM DS) 800-160 MG  tablet Take 1 tablet by mouth 2 (two) times daily. 10/31/20  Yes [provider]  traZODone (DESYREL) 50 MG tablet Take 50 mg by mouth at bedtime.   Yes [provider]  barrier cream (NON-SPECIFIED) CREA Apply 1 application topically as needed (apply topically after toileting as needed to prevent skin breakdown).    [provider]  Camphor-Menthol (MEN-PHOR EX) Apply 1 application topically 2 (two) times daily as needed.    [provider]  camphor-menthol Timoteo Ace) lotion Apply 1 application topically 2 (two) times daily. Apply to back    [provider]  cholecalciferol (VITAMIN D) 1000 units tablet Take 2,000 Units by mouth daily.  Patient not taking: Reported on 11/08/2020    [provider]  Difluprednate (DUREZOL) 0.05 % EMUL Apply 1 drop to eye 2 (two) times daily.    [provider]  guaiFENesin (ROBITUSSIN) 100 MG/5ML liquid Take 300 mg by mouth every 6 (six) hours as needed for cough.     [provider]  ketoconazole (NIZORAL) 2 % cream Apply 1 application topically 2 (two) times daily.    [provider]  lidocaine (LIDODERM) 5 % Place 1 patch onto the skin every 12 (twelve) hours. Remove & Discard patch within 12 hours or as directed by MD Patient not taking: Reported on 11/08/2020 05/30/20 05/30/21  Sable Feil, PA-C  loperamide (IMODIUM A-D) 2 MG tablet Take 4 mg by mouth as needed for diarrhea or loose stools (No more than 8 doses in 24 hours).     [provider]  magnesium hydroxide (MILK OF MAGNESIA) 400 MG/5ML suspension Take 30 mLs by mouth daily as needed for mild constipation.    [provider]  saccharomyces boulardii (FLORASTOR) 250 MG capsule Take 250 mg by mouth daily. Patient not taking: Reported on 11/08/2020    [provider]    Allergies Citrus    Social History Social History   Tobacco Use  . Smoking status: Former Research scientist (life sciences)  . Smokeless tobacco: Never  Used  Vaping Use  . Vaping Use: Never used  Substance Use Topics  . Alcohol use: No  . Drug use: No    Review of Systems Patient denies headaches, rhinorrhea, blurry vision, numbness, shortness of breath, chest pain, edema, cough, abdominal pain, nausea, vomiting, diarrhea, dysuria, fevers, rashes or hallucinations unless otherwise stated above in HPI. ____________________________________________   PHYSICAL EXAM:  VITAL SIGNS: Vitals:   11/08/20 1245 11/08/20 1335  BP:    Pulse: 64   Resp: (!) 24   Temp:  99.4 F (37.4 C)  SpO2: 100%     Constitutional: Alert, frail and chronically ill appearing Eyes: Conjunctivae are normal.  Head: Atraumatic. Nose: No congestion/rhinnorhea. Mouth/Throat: Mucous membranes are moist.   Neck: No stridor. Painless ROM.  Cardiovascular: Normal rate, regular rhythm. Grossly normal  heart sounds.  Good peripheral circulation. Respiratory: Normal respiratory effort.  No retractions. Lungs CTAB. Gastrointestinal: Soft with mild ttp in RUQ. No distention. No abdominal bruits. No CVA tenderness. Genitourinary:  Musculoskeletal: No lower extremity tenderness nor edema.  No joint effusions. Neurologic:  Normal speech and language. No gross focal neurologic deficits are appreciated. No facial droop Skin:  Skin is warm, dry and intact. No rash noted. Psychiatric: Mood and affect are normal. Speech and behavior are normal.  ____________________________________________   LABS (all labs ordered are listed, but only abnormal results are displayed)  Results for orders placed or performed during the hospital encounter of 11/08/20 (from the past 24 hour(s))  Lactic acid, plasma     Status: None   Collection Time: 11/08/20  9:10 AM  Result Value Ref Range   Lactic Acid, Venous 1.9 0.5 - 1.9 mmol/L  Comprehensive metabolic panel     Status: Abnormal   Collection Time: 11/08/20  9:10 AM  Result Value Ref Range   Sodium 132 (L) 135 - 145 mmol/L    Potassium 4.2 3.5 - 5.1 mmol/L   Chloride 103 98 - 111 mmol/L   CO2 21 (L) 22 - 32 mmol/L   Glucose, Bld 108 (H) 70 - 99 mg/dL   BUN 25 (H) 8 - 23 mg/dL   Creatinine, Ser 1.57 (H) 0.44 - 1.00 mg/dL   Calcium 9.5 8.9 - 10.3 mg/dL   Total Protein 6.2 (L) 6.5 - 8.1 g/dL   Albumin 2.7 (L) 3.5 - 5.0 g/dL   AST 18 15 - 41 U/L   ALT 11 0 - 44 U/L   Alkaline Phosphatase 97 38 - 126 U/L   Total Bilirubin 0.6 0.3 - 1.2 mg/dL   GFR, Estimated 34 (L) >60 mL/min   Anion gap 8 5 - 15  CBC WITH DIFFERENTIAL     Status: Abnormal   Collection Time: 11/08/20  9:10 AM  Result Value Ref Range   WBC 24.1 (H) 4.0 - 10.5 K/uL   RBC 3.33 (L) 3.87 - 5.11 MIL/uL   Hemoglobin 10.3 (L) 12.0 - 15.0 g/dL   HCT 31.8 (L) 36.0 - 46.0 %   MCV 95.5 80.0 - 100.0 fL   MCH 30.9 26.0 - 34.0 pg   MCHC 32.4 30.0 - 36.0 g/dL   RDW 13.5 11.5 - 15.5 %   Platelets 291 150 - 400 K/uL   nRBC 0.0 0.0 - 0.2 %   Neutrophils Relative % 79 %   Neutro Abs 19.1 (H) 1.7 - 7.7 K/uL   Lymphocytes Relative 4 %   Lymphs Abs 1.0 0.7 - 4.0 K/uL   Monocytes Relative 8 %   Monocytes Absolute 2.0 (H) 0.1 - 1.0 K/uL   Eosinophils Relative 7 %   Eosinophils Absolute 1.6 (H) 0.0 - 0.5 K/uL   Basophils Relative 1 %   Basophils Absolute 0.1 0.0 - 0.1 K/uL   Immature Granulocytes 1 %   Abs Immature Granulocytes 0.34 (H) 0.00 - 0.07 K/uL  Protime-INR     Status: None   Collection Time: 11/08/20  9:10 AM  Result Value Ref Range   Prothrombin Time 14.8 11.4 - 15.2 seconds   INR 1.2 0.8 - 1.2  APTT     Status: None   Collection Time: 11/08/20  9:10 AM  Result Value Ref Range   aPTT 32 24 - 36 seconds  Urinalysis, Complete w Microscopic Urine, Catheterized     Status: Abnormal   Collection Time: 11/08/20  9:13 AM  Result Value Ref Range   Color, Urine YELLOW (A) YELLOW   APPearance HAZY (A) CLEAR   Specific Gravity, Urine 1.012 1.005 - 1.030   pH 6.0 5.0 - 8.0   Glucose, UA NEGATIVE NEGATIVE mg/dL   Hgb urine dipstick LARGE (A)  NEGATIVE   Bilirubin Urine NEGATIVE NEGATIVE   Ketones, ur NEGATIVE NEGATIVE mg/dL   Protein, ur NEGATIVE NEGATIVE mg/dL   Nitrite NEGATIVE NEGATIVE   Leukocytes,Ua NEGATIVE NEGATIVE   RBC / HPF >50 (H) 0 - 5 RBC/hpf   WBC, UA 6-10 0 - 5 WBC/hpf   Bacteria, UA NONE SEEN NONE SEEN   Squamous Epithelial / LPF 0-5 0 - 5   Mucus PRESENT    Hyaline Casts, UA PRESENT   Resp Panel by RT-PCR (Flu A&B, Covid) Nasopharyngeal Swab     Status: None   Collection Time: 11/08/20 10:34 AM   Specimen: Nasopharyngeal Swab; Nasopharyngeal(NP) swabs in vial transport medium  Result Value Ref Range   SARS Coronavirus 2 by RT PCR NEGATIVE NEGATIVE   Influenza A by PCR NEGATIVE NEGATIVE   Influenza B by PCR NEGATIVE NEGATIVE   ____________________________________________  EKG My review and personal interpretation at Time: 9:06   Indication: hypotension  Rate: 70  Rhythm: sinus Axis: normal Other: normal intervals, no stemi ____________________________________________  RADIOLOGY  I personally reviewed all radiographic images ordered to evaluate for the above acute complaints and reviewed radiology reports and findings.  These findings were personally discussed with the patient.  Please see medical record for radiology report.  ____________________________________________   PROCEDURES  Procedure(s) performed:  Procedures    Critical Care performed: no ____________________________________________   INITIAL IMPRESSION / ASSESSMENT AND PLAN / ED COURSE  Pertinent labs & imaging results that were available during my care of the patient were reviewed by me and considered in my medical decision making (see chart for details).   DDX: Dehydration, electrolyte abnormality, sepsis, pneumonia, cholecystitis, enteritis, cystitis, [place], stone  Danielle Steele is a 80 y.o. who presents to the ED with presentation as described above.  Patient frail-appearing with significant comorbidities.  Blood  pressure improved after IV fluids with EMS.  She is not febrile but does have a low-grade temperature here.  Blood work was sent for the but differential work-up for possible sepsis but does not meet criteria at this time.  Will give IV fluids and reassess.  Will order CT imaging given the pain described.  Clinical Course as of 11/08/20 1352  Fri Nov 08, 2020  1057 CT imaging reviewed.  Given her presentation will order ultrasound right upper quadrant given her white count blood pressure improved with IV fluids.  Lactate negative.  Core temperature was not hyperthermic but was with low-grade temperature. [PR]  1249 Ultrasound reassuring.  With her white count low-grade temperature and soft blood pressure I do think that observation the hospital for IV antibiotics and additional IV fluids as indicated.  Patient with also evidence of AKI.  Will discuss with hospitalist for admission. [PR]    Clinical Course User Index [PR] Merlyn Lot, MD    The patient was evaluated in Emergency Department today for the symptoms described in the history of present illness. He/she was evaluated in the context of the global COVID-19 pandemic, which necessitated consideration that the patient might be at risk for infection with the SARS-CoV-2 virus that causes COVID-19. Institutional protocols and algorithms that pertain to the evaluation of patients at risk for COVID-19 are in a  state of rapid change based on information released by regulatory bodies including the CDC and federal and state organizations. These policies and algorithms were followed during the patient's care in the ED.  As part of my medical decision making, I reviewed the following data within the [place] notes reviewed and incorporated, Labs reviewed, notes from prior ED visits and [place] Controlled Substance Database   ____________________________________________   FINAL CLINICAL IMPRESSION(S) / ED DIAGNOSES  Final  diagnoses:  RUQ abdominal pain  AKI (acute kidney injury) ([place])  Hematuria, unspecified type      NEW MEDICATIONS STARTED DURING THIS VISIT:  New Prescriptions   No medications on file     Note:  This document was prepared using Dragon voice recognition software and may include unintentional dictation errors.    Merlyn Lot, MD 11/08/20 1352

## 2020-11-08 NOTE — Progress Notes (Signed)
Anticoagulation monitoring(Lovenox):  79yo  F ordered Lovenox 40 mg Q24h  Filed Weights   11/08/20 0907  Weight: 49.9 kg (110 lb)   BMI 18.3   Lab Results  Component Value Date   CREATININE 1.57 (H) 11/08/2020   CREATININE 0.92 10/10/2020   CREATININE 0.63 01/08/2017   Estimated Creatinine Clearance: 23.3 mL/min (A) (by C-G formula based on SCr of 1.57 mg/dL (H)). Hemoglobin & Hematocrit     Component Value Date/Time   HGB 10.3 (L) 11/08/2020 0910   HCT 31.8 (L) 11/08/2020 0910     Per Protocol for Patient with estCrcl < 30 ml/min and BMI < 30, will transition to Lovenox 30 mg Q24h     Chinita Greenland PharmD Clinical Pharmacist 11/08/2020

## 2020-11-09 ENCOUNTER — Inpatient Hospital Stay: Payer: Medicare Other

## 2020-11-09 DIAGNOSIS — R9389 Abnormal findings on diagnostic imaging of other specified body structures: Secondary | ICD-10-CM | POA: Diagnosis not present

## 2020-11-09 DIAGNOSIS — G9341 Metabolic encephalopathy: Secondary | ICD-10-CM | POA: Diagnosis not present

## 2020-11-09 DIAGNOSIS — N179 Acute kidney failure, unspecified: Secondary | ICD-10-CM | POA: Diagnosis not present

## 2020-11-09 DIAGNOSIS — R1011 Right upper quadrant pain: Secondary | ICD-10-CM | POA: Diagnosis not present

## 2020-11-09 LAB — CBC
HCT: 30.8 % — ABNORMAL LOW (ref 36.0–46.0)
Hemoglobin: 9.8 g/dL — ABNORMAL LOW (ref 12.0–15.0)
MCH: 31.2 pg (ref 26.0–34.0)
MCHC: 31.8 g/dL (ref 30.0–36.0)
MCV: 98.1 fL (ref 80.0–100.0)
Platelets: 250 10*3/uL (ref 150–400)
RBC: 3.14 MIL/uL — ABNORMAL LOW (ref 3.87–5.11)
RDW: 13.9 % (ref 11.5–15.5)
WBC: 21.3 10*3/uL — ABNORMAL HIGH (ref 4.0–10.5)
nRBC: 0 % (ref 0.0–0.2)

## 2020-11-09 LAB — URINE CULTURE: Culture: NO GROWTH

## 2020-11-09 LAB — BASIC METABOLIC PANEL
Anion gap: 5 (ref 5–15)
BUN: 13 mg/dL (ref 8–23)
CO2: 20 mmol/L — ABNORMAL LOW (ref 22–32)
Calcium: 9.2 mg/dL (ref 8.9–10.3)
Chloride: 113 mmol/L — ABNORMAL HIGH (ref 98–111)
Creatinine, Ser: 1.04 mg/dL — ABNORMAL HIGH (ref 0.44–1.00)
GFR, Estimated: 55 mL/min — ABNORMAL LOW (ref >60)
Glucose, Bld: 126 mg/dL — ABNORMAL HIGH (ref 70–99)
Potassium: 4.7 mmol/L (ref 3.5–5.1)
Sodium: 138 mmol/L (ref 135–145)

## 2020-11-09 IMAGING — MR MR PELVIS WO/W CM
8 series · 48 of 48 positions shown · IV contrast (4ml Gadavist)
Comparison: CT abdomen pelvis dated [DATE].

CLINICAL DATA: Right pelvic bone lesion on CT.

EXAM:
MRI PELVIS WITHOUT AND WITH CONTRAST
TECHNIQUE: Multiplanar multisequence MR imaging of the pelvis was performed
both before and after administration of intravenous contrast.
CONTRAST:  4mL GADAVIST GADOBUTROL 1 MMOL/ML IV SOLN

[Series 8: STIR · coronal · 4.0mm · 1.25mm/px · 5 of 40 slices shown]
[im 1/40]
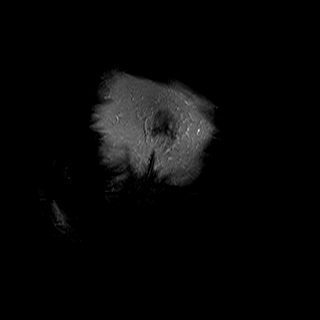
[im 10/40]
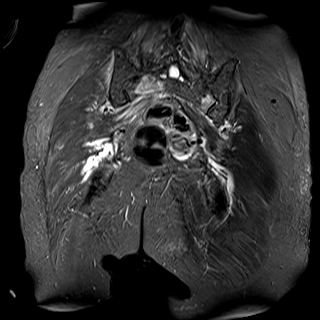
[im 20/40]
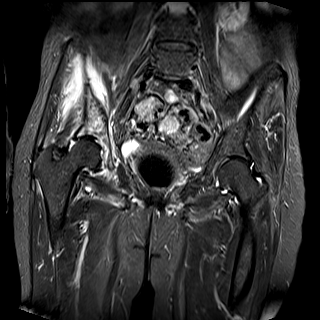
[im 30/40]
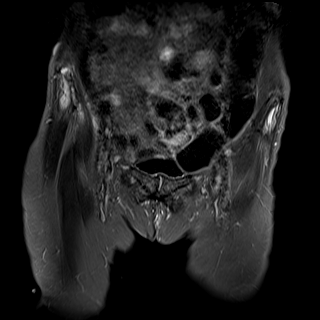
[im 40/40]
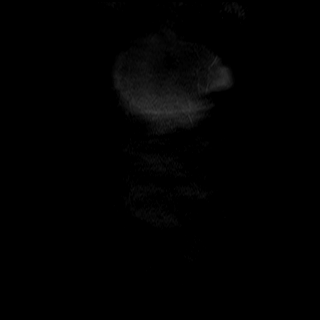

[Series 9: T1 · coronal · 4.0mm · 1.25mm/px · 5 of 40 slices shown (1 of 2)]
[im 1/40]
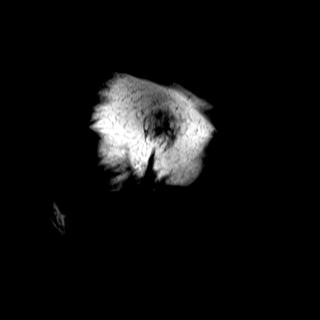
[im 10/40]
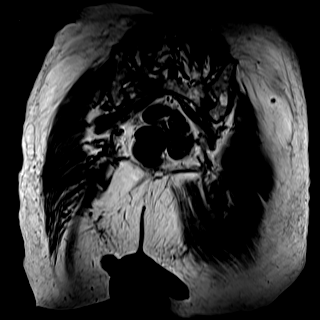
[im 20/40]
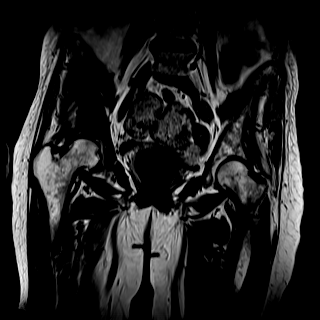
[im 30/40]
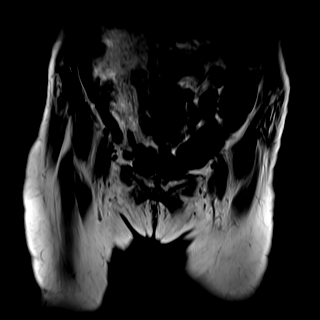
[im 40/40]
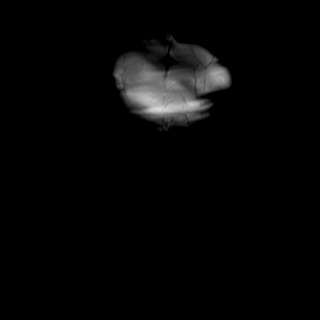

[Series 10: T1 · axial · 4.0mm · 1.48mm/px · z∈[-128,+126]mm · 6 of 52 slices shown (2 of 2)]
[im 1/52]
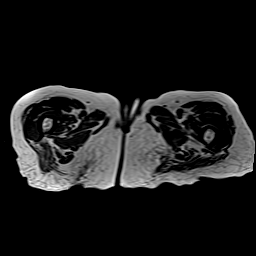
[im 11/52]
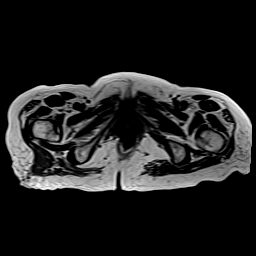
[im 21/52]
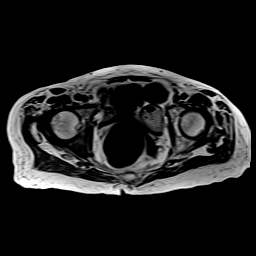
[im 31/52]
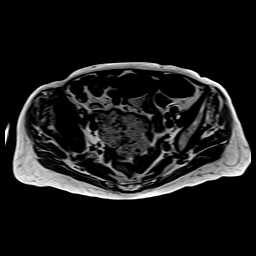
[im 41/52]
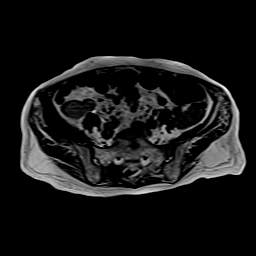
[im 52/52]
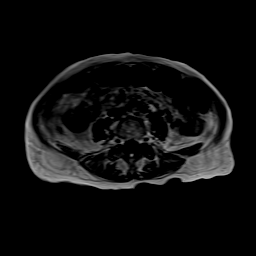

[Series 11: T2 fat-sat · axial · 4.0mm · 1.48mm/px · z∈[-128,+126]mm · 6 of 52 slices shown (1 of 2)]
[im 1/52]
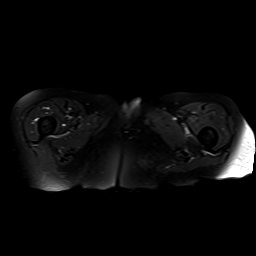
[im 11/52]
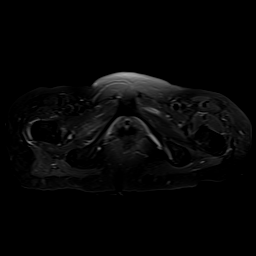
[im 21/52]
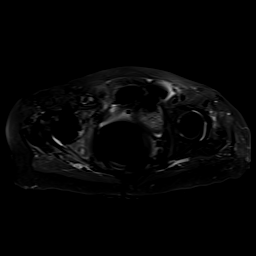
[im 31/52]
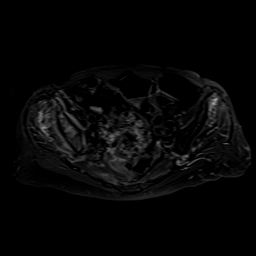
[im 41/52]
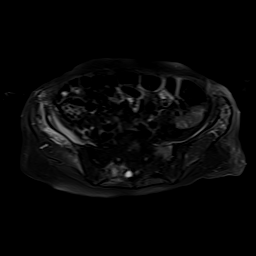
[im 52/52]
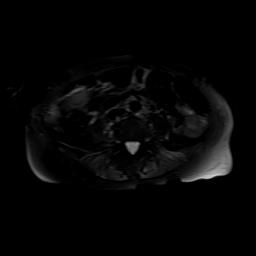

[Series 12: T1 fat-sat post-contrast · axial · non-contrast · 4.0mm · 1.48mm/px · z∈[-128,+126]mm · 6 of 52 slices shown (1 of 3)]
[im 1/52]
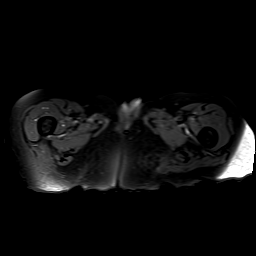
[im 11/52]
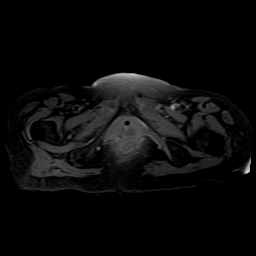
[im 21/52]
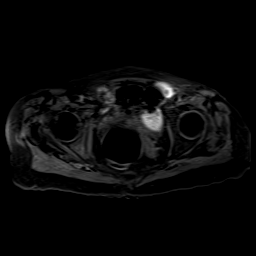
[im 31/52]
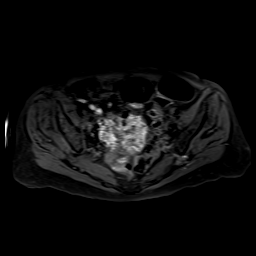
[im 41/52]
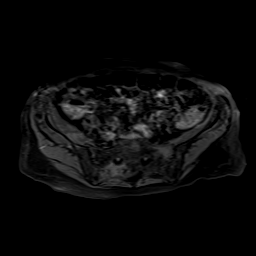
[im 52/52]
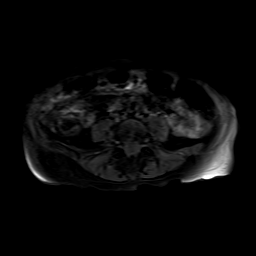

[Series 13: T2 fat-sat · sagittal · 4.0mm · 0.94mm/px · 9 of 72 slices shown (2 of 2)]
[im 1/72]
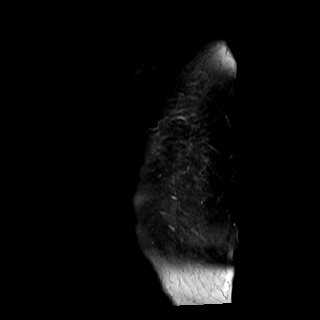
[im 9/72]
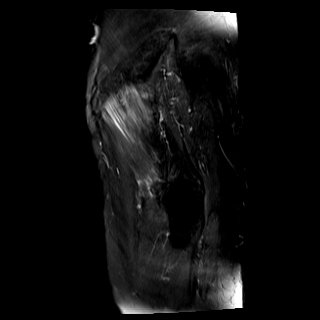
[im 18/72]
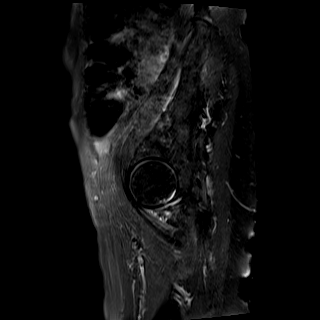
[im 27/72]
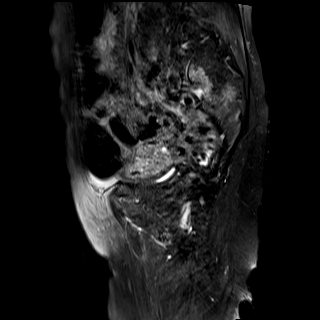
[im 36/72]
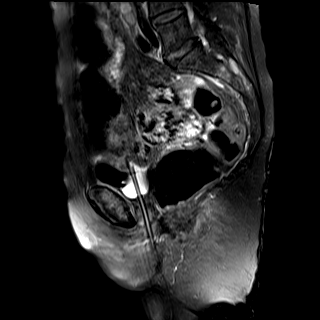
[im 45/72]
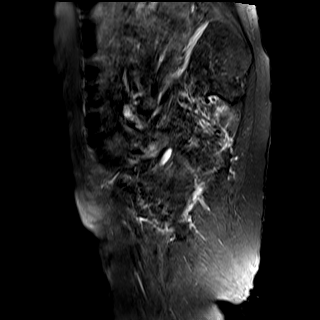
[im 54/72]
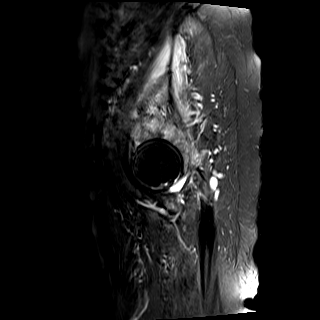
[im 63/72]
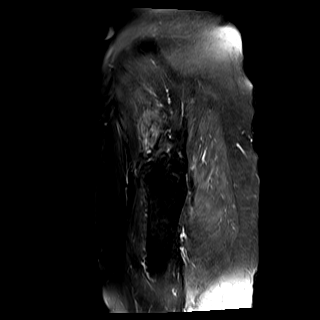
[im 72/72]
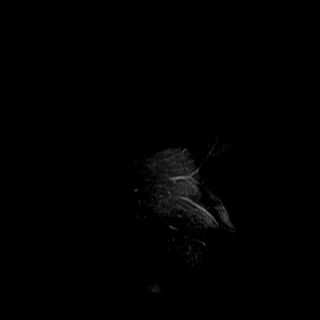

[Series 14: T1 fat-sat post-contrast · axial · non-contrast · 4.0mm · 1.48mm/px · z∈[-128,+126]mm · 6 of 52 slices shown (2 of 3)]
[im 1/52]
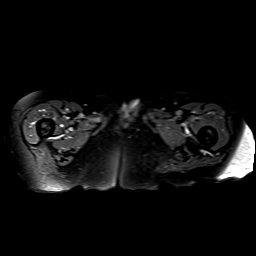
[im 11/52]
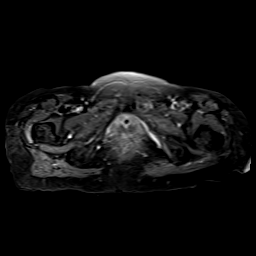
[im 21/52]
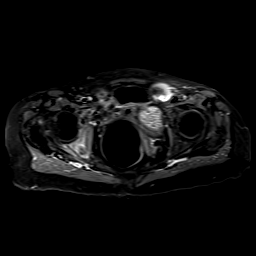
[im 31/52]
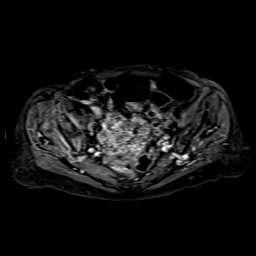
[im 41/52]
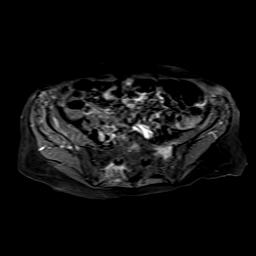
[im 52/52]
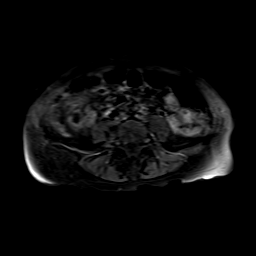

[Series 15: T1 fat-sat post-contrast · coronal · 4.0mm · 1.25mm/px · 5 of 40 slices shown (3 of 3)]
[im 1/40]
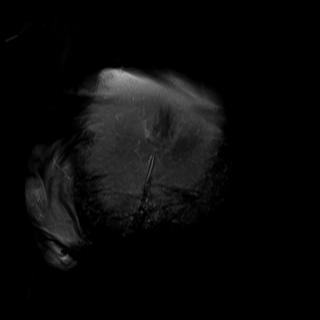
[im 10/40]
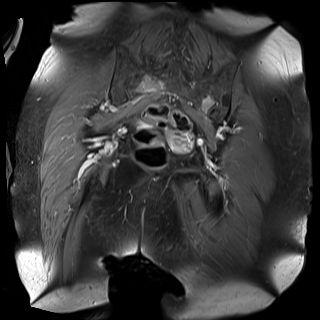
[im 20/40]
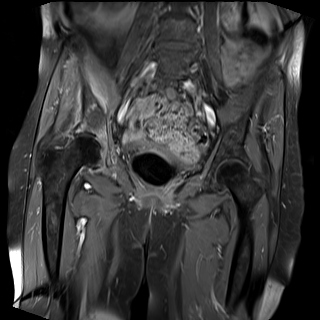
[im 30/40]
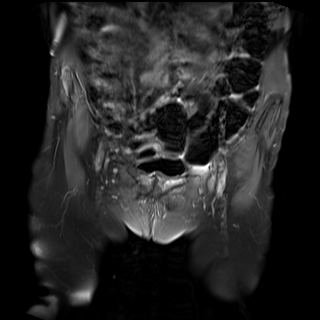
[im 40/40]
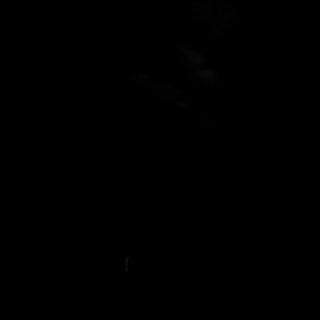

[48 of 48 positions shown; findings below may reference images not displayed]

FINDINGS: Musculoskeletal: Large marrow replacing lesion within the right
iliac bone extending into the acetabulum with associated periosteal
reaction and surrounding reactive edema in the gluteus and iliacus
muscles. Additional marrow replacing lesions in the left and right
sacral ala (series 11, images 13 and 15) and S1 vertebral body
(series 11, image 11. No definite soft tissue mass. No acute
fracture or dislocation.

Urinary Tract:  Foley catheter within the decompressed bladder.

Bowel:  Unremarkable visualized pelvic bowel loops.

Vascular/Lymphatic: No pathologically enlarged lymph nodes. No
significant vascular abnormality seen.

Reproductive:  No mass or other significant abnormality.

Other:  None.
IMPRESSION: 1. Multiple aggressive appearing marrow replacing lesions in the
pelvis, the largest involving the right iliac bone and acetabulum.
Findings are concerning for metastatic disease.

## 2020-11-09 MED ORDER — GADOBUTROL 1 MMOL/ML IV SOLN
4.0000 mL | Freq: Once | INTRAVENOUS | Status: AC | PRN
  Administered 2020-11-09: 4 mL via INTRAVENOUS

## 2020-11-09 MED ORDER — ENOXAPARIN SODIUM 40 MG/0.4ML ~~LOC~~ SOLN
40.0000 mg | SUBCUTANEOUS | Status: DC
  Administered 2020-11-09 – 2020-11-10 (×2): 40 mg via SUBCUTANEOUS
  Filled 2020-11-09 (×2): qty 0.4

## 2020-11-09 NOTE — Progress Notes (Signed)
PHARMACIST - PHYSICIAN COMMUNICATION  CONCERNING:  Enoxaparin (Lovenox) for DVT Prophylaxis    RECOMMENDATION: Patient was prescribed enoxaparin 30mg  q24 hours for VTE prophylaxis.   Filed Weights   11/08/20 0907  Weight: 49.9 kg (110 lb)    Body mass index is 18.3 kg/m.  Estimated Creatinine Clearance: 35.1 mL/min (A) (by C-G formula based on SCr of 1.04 mg/dL (H)).  Patient is candidate for enoxaparin 40mg  every 24 hours based on CrCl >70ml/min and Weight >45kg  DESCRIPTION: Pharmacy has adjusted enoxaparin dose per [facility] policy.  Patient is now receiving enoxaparin 40 mg every 24 hours   Benita Gutter 11/09/2020 7:38 AM

## 2020-11-09 NOTE — Evaluation (Signed)
Occupational Therapy Evaluation Patient Details Name: Danielle Steele MRN: [id number] DOB: [birth year] Today's Date: 11/09/2020    History of Present Illness Pt is a 80 y/o F with PMH: HTN, HLD, DM, GERD, pre-diabetes, schizophrenia, bipolar disorder, depression, anxiety and tremors who presented to ED from [place] assisted living d/t abdominal pain, hypotension and altered mental status. Abdominal pain etiology unclear, per MD note: CT w/ mildly distended gallbladder but no overt thickening, abd [place] negative and LFTs normal. Pt adm d/t meeting sepsis criteria with WBC of 24.1. Suspect septic 2/2 UTI.   Clinical Impression   Pt seen for OT evaluation this date in setting of acute hospitalization d/t sepsis. Pt largely confused with incoherent speech this date, but the speech is discernable. She does mention a brother named [name] who is listed in the chart and via telephone, he assists with PLOF information. Pt was at ALF and was amb with AD up until 3 weeks ago when she sustained a fall. Has since been using a w/c. Pt was able to perform most basic self care including toileting, but since fall, has required assistance for bathing and dressing. In addition, her brother endorses that while pt is somewhat confused at baseline, she has had significant cognitive decline since that fall. Pt presents this date with weakness and decreased fxl activity tolerance. She requires MIN A for transfers with RW and MOD A for LB ADLs. Will continue to follow acutely, anticipate she will require STR stay before returning to ALF setting.     Follow Up Recommendations  SNF    Equipment Recommendations  Other (comment) (defer)    Recommendations for Other Services       Precautions / Restrictions Precautions Precautions: Fall Restrictions Weight Bearing Restrictions: No      Mobility Bed Mobility Overal bed mobility: Needs Assistance Bed Mobility: Supine to Sit;Sit to Supine     Supine to sit:  Min guard;HOB elevated Sit to supine: Min guard;HOB elevated        Transfers Overall transfer level: Needs assistance Equipment used: Rolling walker (2 wheeled) Transfers: Sit to/from Stand Sit to Stand: Min guard;Min assist         General transfer comment: increased time, cues for hand placement (responds best to tactile cues)    Balance Overall balance assessment: Needs assistance   Sitting balance-Leahy Scale: Good       Standing balance-Leahy Scale: Fair Standing balance comment: requires UE support on RW, tolerates for ~1 minute                           ADL either performed or assessed with clinical judgement   ADL Overall ADL's : Needs assistance/impaired                                       General ADL Comments: SETUP to MIN A and cues to sequence for seated UB ADLs, MIN/MOD A for seated LB ADLs d/t poor dynamic sitting balance.     Vision Patient Visual Report: No change from baseline       Perception     Praxis      Pertinent Vitals/Pain Pain Assessment: Faces Faces Pain Scale: Hurts a little bit Pain Location: belly, only with coming to sitting from laying Pain Descriptors / Indicators: Grimacing Pain Intervention(s): Limited activity within patient's tolerance;Monitored during session  Hand Dominance     Extremity/Trunk Assessment Upper Extremity Assessment Upper Extremity Assessment: Generalized weakness   Lower Extremity Assessment Lower Extremity Assessment: Generalized weakness       Communication Communication Communication: Other (comment) (somewhat garbled and also occasional word salad, but mostly understandable speech although largely incoherent)   Cognition Arousal/Alertness: Awake/alert Behavior During Therapy: WFL for tasks assessed/performed Overall Cognitive Status: Impaired/Different from baseline Area of Impairment: Orientation;Attention;Memory;Following  commands;Safety/judgement;Awareness;Problem solving                 Orientation Level: Disoriented to;Place;Time;Situation Current Attention Level: Alternating Memory: Decreased recall of precautions;Decreased short-term memory Following Commands: Follows one step commands with increased time Safety/Judgement: Decreased awareness of safety;Decreased awareness of deficits Awareness: Emergent Problem Solving: Slow processing;Decreased initiation;Difficulty sequencing;Requires verbal cues;Requires tactile cues General Comments: Pt able to follow ~80% of simple one step commands with increased processing time. She is very communicative, but largely incoherent.   General Comments       Exercises     Shoulder Instructions      Home Living Family/patient expects to be discharged to:: Assisted living                             Home Equipment: Walker - 2 wheels;Wheelchair - manual          Prior Functioning/Environment Level of Independence: Needs assistance  Gait / Transfers Assistance Needed: pt was primarily ambulatory until a fall 3 weeks ago and has since been largely w/c bound at the advice of PT in the ALF that she lives in. ADL's / Homemaking Assistance Needed: facility provides med mgt, meals and other IADLs. Pt was able to perform most basic self care including bathing and dressing until fall 3WA   Comments: Pt is confused and poor historian at this time, information largely gathered via calling her brother, [name].        OT Problem List: Decreased strength;Decreased activity tolerance;Decreased cognition;Decreased safety awareness;Decreased knowledge of use of DME or AE;Pain      OT Treatment/Interventions: Self-care/ADL training;DME and/or AE instruction;Therapeutic activities;Balance training;Therapeutic exercise;Energy conservation;Patient/family education    OT Goals(Current goals can be found in the care plan section) Acute Rehab OT Goals Patient  Stated Goal: none stated OT Goal Formulation: Patient unable to participate in goal setting Time For Goal Achievement: 11/23/20 Potential to Achieve Goals: Fair ADL Goals Pt Will Perform Grooming: with supervision;sitting (with <10% verbal/visual cues to sequence) Pt Will Perform Lower Body Dressing: with min assist;sit to/from stand (with LRAD to stand and perform clothing mgt over hips) Pt Will Transfer to Toilet: with min guard assist;ambulating;bedside commode (with LRAD To BSC at least ~10' away to increase tolerance for fxl distances.) Pt/caregiver will Perform Home Exercise Program: Increased strength;Both right and left upper extremity;With minimal assist  OT Frequency: Min 1X/week   Barriers to D/C:            Co-evaluation              AM-PAC OT "6 Clicks" Daily Activity     Outcome Measure Help from another person eating meals?: None Help from another person taking care of personal grooming?: A Little Help from another person toileting, which includes using toliet, bedpan, or urinal?: A Lot Help from another person bathing (including washing, rinsing, drying)?: A Lot Help from another person to put on and taking off regular upper body clothing?: A Little Help from another person to put on and  taking off regular lower body clothing?: A Lot 6 Click Score: 16   End of Session Equipment Utilized During Treatment: Gait belt;Rolling walker Nurse Communication: Mobility status  Activity Tolerance: Patient tolerated treatment well Patient left: in bed;with call bell/phone within reach;with bed alarm set  OT Visit Diagnosis: Unsteadiness on feet (R26.81);Muscle weakness (generalized) (M62.81);Other symptoms and signs involving cognitive function                Time: 7998-7215 OT Time Calculation (min): 18 min Charges:  OT General Charges $OT Visit: 1 Visit OT Evaluation $OT Eval Moderate Complexity: 1 Mod OT Treatments $Therapeutic Activity: 8-22 mins  Gerrianne Scale,  MS, OTR/L ascom [phone] 11/09/20, 4:50 PM

## 2020-11-09 NOTE — Progress Notes (Signed)
Asked by RN to call [facility] to determine patient's baseline mental status.   Called [facility] and spoke with [place]. Varney Biles confirmed patient is a long term resident there. Varney Biles reported patient's confusion had been getting worse over the past several weeks. Varney Biles stated patient does get confused about where she is at times.   PT/OT evals are pending. Varney Biles reported patient is already active with [facility] at the ALF.   [facility] cannot provide transportation if patient is discharged over the weekend, but can on weekdays.  TOC to continue to follow.  Oleh Genin, [place]

## 2020-11-09 NOTE — Progress Notes (Addendum)
Patient confused and found occasionally trying to get out of bed this morning; she is redirectable and remains in bed for 30+ minutes after redirected. She is speaking about "going to the pool," oriented only to self at this time.  Dr. Nigel Bridgeman aware of patient's mental status and behavior. Telemetry has been discontinued, we are to evaluate patient's oral intake to determine if IVF can be discontinued due to fall risk. Floor mats are on floor, seeking a room next to nursing station. No other new orders at this time, will continue to monitor for necessity of telesitter. Notified patient's baseline mental status of A&O x3 per son, and report from facility per [facility], SW's note.

## 2020-11-09 NOTE — Progress Notes (Signed)
PROGRESS NOTE    Danielle Steele  WVP:[id number] DOB: [birth year] DOA: 11/08/2020 PCP: Abby Potash, PA-C    Brief Narrative:  80 year old female with history of hypertension, hyperlipidemia, GERD, depression, anxiety, scheduled for NANA bipolar disorder who lives in an assisted living facility brought to the hospital with abdominal pain, hypotension and altered mental status.  Patient does have underlying history of extensive mental health issues but mostly functional and communicating as per family.  According to the brother, patient was treated for UTI 2 times last month and lately on Bactrim.  Patient also noted to be progressively weak and deconditioned for last month or so.  In the emergency room, WBC count 24.1, urinalysis not very impressive for infection.  Lithium level was therapeutic.  LFTs were normal.  Lactic acid was normal.  Found to have AKI.  Urinary retention more than 750 mL urine and Foley catheter was placed.  Admitted with leukocytosis, altered mental status with unknown cause.   Assessment & Plan:   Principal Problem:   Abdominal pain Active Problems:   UTI (urinary tract infection)   Sepsis (HCC)   Schizophrenia (HCC)   Bipolar disorder (HCC)   Depression   Anxiety   Hypertension   HLD (hyperlipidemia)   AKI (acute kidney injury) (HCC)   Abnormal CT scan   Acute metabolic encephalopathy  Abdominal pain and leukocytosis: Currently resolved.  CT scan abdomen pelvis with no explanation of abdominal pain.  Right upper quadrant ultrasound negative for acute cholecystitis.  Her abdominal pain could possibly been from urinary retention.  Symptomatic treatment.  Foley catheter to be removed today and given voiding trial. Presumed UTI, on Rocephin will continue next 24 hours until cultures are available. Leukocytosis could be stress reaction, will keep Rocephin today, recheck tomorrow.  Altered mental status with history of multiple mental health issues including  schizophrenia, bipolar, depression and anxiety: Lithium level is therapeutic. Patient is on multiple other medications including Cogentin, Wellbutrin, Klonopin that we will continue.  Continue lithium. Metabolic encephalopathy could be from Bactrim or from polypharmacy during dehydration. CT head was normal. Symptomatic treatment.  Mobility.  Maintenance IV fluids.  Encourage oral intake.  PT OT.  Hypertension: Blood pressures stabilized.  Holding lisinopril due to acute kidney injury and hypotension.  Acute kidney injury: Likely due to urinary retention.  Resolved after Foley catheterization and IV fluids.  Keep maintenance IV fluids today.  Abnormal right iliac bone and acetabular CT scan: Irregular periosteum noted in the CT scan.  MRI done today, results pending.   DVT prophylaxis: enoxaparin (LOVENOX) injection 40 mg Start: 11/09/20 2000   Code Status: Partial.  No intubation, okay for CPR. Family Communication: Patient's brother on the phone Disposition Plan: Status is: Inpatient  Remains inpatient appropriate because:Hemodynamically unstable and Altered mental status   Dispo: The patient is from: ALF              Anticipated d/c is to: ALF              Patient currently is not medically stable to d/c.   Difficult to place patient No         Consultants:   None  Procedures:   None  Antimicrobials:   Rocephin, 4/8----   Subjective: Patient seen and examined.  Poor historian.  She keeps talking and has some incomprehensible words.  Denies any abdominal pain.  Objective: Vitals:   11/08/20 2008 11/08/20 2255 11/09/20 0415 11/09/20 0720  BP: (!) 120/56 (!) 109/52 (!) 117/53 Marland Kitchen)  117/59  Pulse: 73 68 70 75  Resp: 20 17 20    Temp: 98.9 F (37.2 C) 98.8 F (37.1 C) 98.1 F (36.7 C) 98.5 F (36.9 C)  TempSrc: Oral Oral Oral Oral  SpO2: 98% 96% 94% 100%  Weight:      Height:        Intake/Output Summary (Last 24 hours) at 11/09/2020 1103 Last data filed  at 11/09/2020 1014 Gross per 24 hour  Intake 940.29 ml  Output 4950 ml  Net -4009.71 ml   Filed Weights   11/08/20 0907  Weight: 49.9 kg    Examination:  General exam: Patient looks comfortable, she continues to talk incomprehensible words. Respiratory system: Clear to auscultation. Respiratory effort normal. Cardiovascular system: S1 & S2 heard, RRR. No JVD, murmurs, rubs, gallops or clicks. No pedal edema. Gastrointestinal system: Abdomen is nondistended, soft and nontender. No organomegaly or masses felt. Normal bowel sounds heard. Foley catheter with clear urine. Central nervous system: Alert and awake.  Not oriented.   Extremities: Symmetric 5 x 5 power.  Generalized weakness. Skin: No rashes, lesions or ulcers   Data Reviewed: I have personally reviewed following labs and imaging studies  CBC: Recent Labs  Lab 11/08/20 0910 11/09/20 0601  WBC 24.1* 21.3*  NEUTROABS 19.1*  --   HGB 10.3* 9.8*  HCT 31.8* 30.8*  MCV 95.5 98.1  PLT 291 409   Basic Metabolic Panel: Recent Labs  Lab 11/08/20 0910 11/09/20 0601  NA 132* 138  K 4.2 4.7  CL 103 113*  CO2 21* 20*  GLUCOSE 108* 126*  BUN 25* 13  CREATININE 1.57* 1.04*  CALCIUM 9.5 9.2   GFR: Estimated Creatinine Clearance: 35.1 mL/min (A) (by C-G formula based on SCr of 1.04 mg/dL (H)). Liver Function Tests: Recent Labs  Lab 11/08/20 0910  AST 18  ALT 11  ALKPHOS 97  BILITOT 0.6  PROT 6.2*  ALBUMIN 2.7*   No results for input(s): LIPASE, AMYLASE in the last 168 hours. No results for input(s): AMMONIA in the last 168 hours. Coagulation Profile: Recent Labs  Lab 11/08/20 0910  INR 1.2   Cardiac Enzymes: No results for input(s): CKTOTAL, CKMB, CKMBINDEX, TROPONINI in the last 168 hours. BNP (last 3 results) No results for input(s): PROBNP in the last 8760 hours. HbA1C: No results for input(s): HGBA1C in the last 72 hours. CBG: No results for input(s): GLUCAP in the last 168 hours. Lipid  Profile: No results for input(s): CHOL, HDL, LDLCALC, TRIG, CHOLHDL, LDLDIRECT in the last 72 hours. Thyroid Function Tests: No results for input(s): TSH, T4TOTAL, FREET4, T3FREE, THYROIDAB in the last 72 hours. Anemia Panel: No results for input(s): VITAMINB12, FOLATE, FERRITIN, TIBC, IRON, RETICCTPCT in the last 72 hours. Sepsis Labs: Recent Labs  Lab 11/08/20 0910  PROCALCITON <0.10  LATICACIDVEN 1.9    Recent Results (from the past 240 hour(s))  Resp Panel by RT-PCR (Flu A&B, Covid) Nasopharyngeal Swab     Status: None   Collection Time: 11/08/20 10:34 AM   Specimen: Nasopharyngeal Swab; Nasopharyngeal(NP) swabs in vial transport medium  Result Value Ref Range Status   SARS Coronavirus 2 by RT PCR NEGATIVE NEGATIVE Final    Comment: (NOTE) SARS-CoV-2 target nucleic acids are NOT DETECTED.  The SARS-CoV-2 RNA is generally detectable in upper respiratory specimens during the acute phase of infection. The lowest concentration of SARS-CoV-2 viral copies this assay can detect is 138 copies/mL. A negative result does not preclude SARS-Cov-2 infection and should not be used  as the sole basis for treatment or other patient management decisions. A negative result may occur with  improper specimen collection/handling, submission of specimen other than nasopharyngeal swab, presence of viral mutation(s) within the areas targeted by this assay, and inadequate number of viral copies(<138 copies/mL). A negative result must be combined with clinical observations, patient history, and epidemiological information. The expected result is Negative.  Fact Sheet for Patients:  EntrepreneurPulse.com.au  Fact Sheet for Healthcare Providers:  IncredibleEmployment.be  This test is no t yet approved or cleared by the [place] FDA and  has been authorized for detection and/or diagnosis of SARS-CoV-2 by FDA under an Emergency Use Authorization (EUA). This  EUA will remain  in effect (meaning this test can be used) for the duration of the COVID-19 declaration under Section 564(b)(1) of the Act, 21 U.S.C.section 360bbb-3(b)(1), unless the authorization is terminated  or revoked sooner.       Influenza A by PCR NEGATIVE NEGATIVE Final   Influenza B by PCR NEGATIVE NEGATIVE Final    Comment: (NOTE) The Xpert Xpress SARS-CoV-2/FLU/RSV plus assay is intended as an aid in the diagnosis of influenza from Nasopharyngeal swab specimens and should not be used as a sole basis for treatment. Nasal washings and aspirates are unacceptable for Xpert Xpress SARS-CoV-2/FLU/RSV testing.  Fact Sheet for Patients: EntrepreneurPulse.com.au  Fact Sheet for Healthcare Providers: IncredibleEmployment.be  This test is not yet approved or cleared by the [place] FDA and has been authorized for detection and/or diagnosis of SARS-CoV-2 by FDA under an Emergency Use Authorization (EUA). This EUA will remain in effect (meaning this test can be used) for the duration of the COVID-19 declaration under Section 564(b)(1) of the Act, 21 U.S.C. section 360bbb-3(b)(1), unless the authorization is terminated or revoked.  Performed at [facility], [address] [place], [place] [address]   CULTURE, BLOOD (ROUTINE X 2) w Reflex to ID Panel     Status: None (Preliminary result)   Collection Time: 11/08/20  1:20 PM   Specimen: BLOOD  Result Value Ref Range Status   Specimen Description BLOOD RIGHT ANTECUBITAL  Final   Special Requests   Final    BOTTLES DRAWN AEROBIC AND ANAEROBIC Blood Culture adequate volume   Culture   Final    NO GROWTH < 24 HOURS Performed at [facility], [address], [place], [place] [address]    Report Status PENDING  Incomplete  CULTURE, BLOOD (ROUTINE X 2) w Reflex to ID Panel     Status: None (Preliminary result)   Collection Time: 11/08/20  1:20 PM   Specimen: BLOOD   Result Value Ref Range Status   Specimen Description BLOOD LEFT ANTECUBITAL  Final   Special Requests   Final    BOTTLES DRAWN AEROBIC AND ANAEROBIC Blood Culture adequate volume   Culture   Final    NO GROWTH < 24 HOURS Performed at [facility], [address] [place], [place] [address]    Report Status PENDING  Incomplete  MRSA PCR Screening     Status: None   Collection Time: 11/08/20  9:23 PM   Specimen: Nasal Mucosa; Nasopharyngeal  Result Value Ref Range Status   MRSA by PCR NEGATIVE NEGATIVE Final    Comment:        The GeneXpert MRSA Assay (FDA approved for NASAL specimens only), is one component of a comprehensive MRSA colonization surveillance program. It is not intended to diagnose MRSA infection nor to guide or monitor treatment for  MRSA infections. Performed at [facility], [address] [place], [place] [address]          Radiology Studies: CT ABDOMEN PELVIS WO CONTRAST  Result Date: 11/08/2020 CLINICAL DATA:  Abdominal pain and fever EXAM: CT ABDOMEN AND PELVIS WITHOUT CONTRAST TECHNIQUE: Multidetector CT imaging of the abdomen and pelvis was performed following the standard protocol without oral or IV contrast. COMPARISON:  None. FINDINGS: Lower chest: There is bibasilar atelectatic change. No consolidation in the lung bases. There are foci of coronary artery calcification. Hepatobiliary: No focal liver lesions are appreciable. The gallbladder appears distended. Gallbladder wall does not appear appreciably thickened by CT. No biliary duct dilatation evident. Pancreas: There is no pancreatic mass or inflammatory focus. Spleen: No splenic lesions are evident. Adrenals/Urinary Tract: There is mild adrenal hypertrophy bilaterally. No focal adrenal lesions evident. There is no appreciable renal mass or hydronephrosis on either side. There is no evident renal or ureteral calculus on either side. The urinary bladder appears distended without wall  thickening. Stomach/Bowel: There is no appreciable bowel wall or mesenteric thickening. There is moderate stool and air in the colon. There is no evident bowel obstruction. Terminal ileum appears unremarkable. Appendix appears normal. No evident free air or portal venous air. Vascular/Lymphatic: There is no abdominal aortic aneurysm. There is extensive aortic and iliac artery atherosclerotic calcification. No adenopathy is evident in the abdomen or pelvis. Reproductive: Uterus is mildly canted to the left. No adnexal masses are evident. Other: No evident abscess or ascites in the abdomen or pelvis. Musculoskeletal: Relative sclerosis noted in the right iliac bone in a portion of the right acetabulum with mildly irregular periosteum, likely due to Paget's disease. No lytic or destructive lesions are evident. There is degenerative change in each pubic symphysis. No intramuscular lesions evident. IMPRESSION: 1. 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 Note that there is mildly irregular periosteum in this area. An atypical bony neoplasm could potentially present in this manner. Given this appearance of the periosteum in this area, correlation with MR of the right iliac crest and acetabulum is advised to further evaluate. 2. Gallbladder is mildly distended without overt wall thickening by CT. No biliary duct dilatation. Correlation with ultrasound of the gallbladder may be advisable in this circumstance. 3. Urinary bladder distended without wall thickening. No renal or ureteral calculus. No hydronephrosis on either side. 4. No evident bowel obstruction. No abscess in the abdomen or pelvis. Appendix region appears normal. 5. Aortic Atherosclerosis (ICD10-I70.0). Foci of coronary artery and iliac artery atherosclerotic calcification noted. 6. Mild adrenal hypertrophy bilaterally without focal adrenal lesion. Significance of mild adrenal hypertrophy is uncertain in this age group.  Electronically Signed   By: Lowella Grip III M.D.   On: 11/08/2020 10:30   CT HEAD WO CONTRAST  Result Date: 11/08/2020 CLINICAL DATA:  Mental status change EXAM: CT HEAD WITHOUT CONTRAST TECHNIQUE: Contiguous axial images were obtained from the base of the skull through the vertex without intravenous contrast. COMPARISON:  October 10, 2020 FINDINGS: Brain: No evidence of acute territorial infarction, hemorrhage, hydrocephalus,extra-axial collection or mass lesion/mass effect. There is dilatation the ventricles and sulci consistent with age-related atrophy. Low-attenuation changes in the deep white matter consistent with small vessel ischemia. Vascular: No hyperdense vessel or unexpected calcification. Skull: The skull is intact. No fracture or focal lesion identified. Sinuses/Orbits: The visualized paranasal sinuses and mastoid air cells are clear. The orbits and globes intact. Other: None IMPRESSION: No acute intracranial abnormality. Findings consistent  with age related atrophy and chronic small vessel ischemia Electronically Signed   By: Prudencio Pair M.D.   On: 11/08/2020 15:58   DG Chest Port 1 View  Result Date: 11/08/2020 CLINICAL DATA:  Questionable sepsis EXAM: PORTABLE CHEST 1 VIEW COMPARISON:  None. FINDINGS: Mild coarsening of lung markings. No focal pneumonia. No edema, effusion, or pneumothorax. Normal heart size and mediastinal contours. IMPRESSION: 1. No focal pneumonia. 2. Coarsened lung markings which could be chronic lung disease or atypical infection. Electronically Signed   By: Monte Fantasia M.D.   On: 11/08/2020 09:46   US ABDOMEN LIMITED RUQ (LIVER/GB)  Result Date: 11/08/2020 CLINICAL DATA:  Right upper quadrant pain EXAM: ULTRASOUND ABDOMEN LIMITED RIGHT UPPER QUADRANT COMPARISON:  None. FINDINGS: Gallbladder: No gallstones or wall thickening visualized. No sonographic Murphy sign noted by sonographer. Common bile duct: Diameter: 7.5 mm, upper normal Liver: No focal lesion  identified. Within normal limits in parenchymal echogenicity. Portal vein is patent on color Doppler imaging with normal direction of blood flow towards the liver. Other: None. IMPRESSION: Negative for gallstones. Common bile duct upper normal. No liver lesion. Electronically Signed   By: Franchot Gallo M.D.   On: 11/08/2020 12:45        Scheduled Meds: . atorvastatin  20 mg Oral QPM  . benztropine  0.5 mg Oral BID  . buPROPion  150 mg Oral BH-q7a  . cholecalciferol  2,000 Units Oral q AM  . clonazePAM  0.5 mg Oral TID  . Difluprednate  1 drop Ophthalmic BID  . enoxaparin (LOVENOX) injection  40 mg Subcutaneous Q24H  . lactobacillus acidophilus & bulgar  1 tablet Oral Daily  . lithium carbonate  150 mg Oral BID WC  . loratadine  10 mg Oral Daily  . traZODone  50 mg Oral QHS   Continuous Infusions: . sodium chloride 50 mL/hr at 11/09/20 1050  . cefTRIAXone (ROCEPHIN)  IV       LOS: 1 day    Time spent: 30 minutes    Barb Merino, MD [facility] Pager [phone]

## 2020-11-10 DIAGNOSIS — R1011 Right upper quadrant pain: Secondary | ICD-10-CM | POA: Diagnosis not present

## 2020-11-10 DIAGNOSIS — G9341 Metabolic encephalopathy: Secondary | ICD-10-CM | POA: Diagnosis not present

## 2020-11-10 DIAGNOSIS — N179 Acute kidney failure, unspecified: Secondary | ICD-10-CM | POA: Diagnosis not present

## 2020-11-10 DIAGNOSIS — N3000 Acute cystitis without hematuria: Secondary | ICD-10-CM

## 2020-11-10 DIAGNOSIS — R9389 Abnormal findings on diagnostic imaging of other specified body structures: Secondary | ICD-10-CM | POA: Diagnosis not present

## 2020-11-10 LAB — CBC WITH DIFFERENTIAL/PLATELET
Abs Immature Granulocytes: 0.27 10*3/uL — ABNORMAL HIGH (ref 0.00–0.07)
Basophils Absolute: 0.1 10*3/uL (ref 0.0–0.1)
Basophils Relative: 1 %
Eosinophils Absolute: 1.7 10*3/uL — ABNORMAL HIGH (ref 0.0–0.5)
Eosinophils Relative: 8 %
HCT: 32.5 % — ABNORMAL LOW (ref 36.0–46.0)
Hemoglobin: 10.3 g/dL — ABNORMAL LOW (ref 12.0–15.0)
Immature Granulocytes: 1 %
Lymphocytes Relative: 5 %
Lymphs Abs: 1 10*3/uL (ref 0.7–4.0)
MCH: 31.1 pg (ref 26.0–34.0)
MCHC: 31.7 g/dL (ref 30.0–36.0)
MCV: 98.2 fL (ref 80.0–100.0)
Monocytes Absolute: 2 10*3/uL — ABNORMAL HIGH (ref 0.1–1.0)
Monocytes Relative: 9 %
Neutro Abs: 16.4 10*3/uL — ABNORMAL HIGH (ref 1.7–7.7)
Neutrophils Relative %: 76 %
Platelets: 304 10*3/uL (ref 150–400)
RBC: 3.31 MIL/uL — ABNORMAL LOW (ref 3.87–5.11)
RDW: 13.7 % (ref 11.5–15.5)
WBC: 21.4 10*3/uL — ABNORMAL HIGH (ref 4.0–10.5)
nRBC: 0 % (ref 0.0–0.2)

## 2020-11-10 LAB — COMPREHENSIVE METABOLIC PANEL
ALT: 11 U/L (ref 0–44)
AST: 16 U/L (ref 15–41)
Albumin: 2.8 g/dL — ABNORMAL LOW (ref 3.5–5.0)
Alkaline Phosphatase: 93 U/L (ref 38–126)
Anion gap: 7 (ref 5–15)
BUN: 14 mg/dL (ref 8–23)
CO2: 21 mmol/L — ABNORMAL LOW (ref 22–32)
Calcium: 9.8 mg/dL (ref 8.9–10.3)
Chloride: 112 mmol/L — ABNORMAL HIGH (ref 98–111)
Creatinine, Ser: 0.96 mg/dL (ref 0.44–1.00)
GFR, Estimated: >60 mL/min (ref >60)
Glucose, Bld: 100 mg/dL — ABNORMAL HIGH (ref 70–99)
Potassium: 3.7 mmol/L (ref 3.5–5.1)
Sodium: 140 mmol/L (ref 135–145)
Total Bilirubin: 0.4 mg/dL (ref 0.3–1.2)
Total Protein: 6.2 g/dL — ABNORMAL LOW (ref 6.5–8.1)

## 2020-11-10 LAB — PHOSPHORUS: Phosphorus: 2.1 mg/dL — ABNORMAL LOW (ref 2.5–4.6)

## 2020-11-10 LAB — MAGNESIUM: Magnesium: 2.2 mg/dL (ref 1.7–2.4)

## 2020-11-10 MED ORDER — RISAQUAD PO CAPS
1.0000 | ORAL_CAPSULE | Freq: Every day | ORAL | Status: DC
  Administered 2020-11-11 – 2020-11-15 (×5): 1 via ORAL
  Filled 2020-11-10 (×6): qty 1

## 2020-11-10 MED ORDER — IOHEXOL 9 MG/ML PO SOLN
500.0000 mL | ORAL | Status: AC
  Administered 2020-11-10 (×2): 500 mL via ORAL

## 2020-11-10 MED ORDER — SODIUM CHLORIDE 0.9 % IV SOLN: INTRAVENOUS | Status: DC | PRN

## 2020-11-10 NOTE — Progress Notes (Signed)
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  Patient did come to the hospital with abdominal pain/hip pain along with urinary retention. Noncontrast CT scan with no evidence of tumor in the abdomen pelvis.  Plan: CT scan of the chest abdomen pelvis with and without contrast to screen for any primary malignancy. CT-guided biopsy, will request to IR tomorrow. We will discuss with oncology tomorrow morning. Called and updated patient's brother, her healthcare power of attorney.  We discussed that making diagnosis will be helpful.  If there is any possible treatment or patient can tolerate any treatment that needs to be discussed after diagnosis.

## 2020-11-10 NOTE — Evaluation (Signed)
Physical Therapy Evaluation Patient Details Name: Danielle Steele MRN: [id number] DOB: 08-26-1940 Today's Date: 11/10/2020   History of Present Illness  Pt is a 80 y/o F with PMH: HTN, HLD, DM, GERD, pre-diabetes, schizophrenia, bipolar disorder, depression, anxiety and tremors who presented to ED from [facility] d/t abdominal pain, hypotension and altered mental status. Abdominal pain etiology unclear, per MD note: CT w/ mildly distended gallbladder but no overt thickening, abd [place] negative and LFTs normal. Pt adm d/t meeting sepsis criteria with WBC of 24.1. Suspect septic 2/2 UTI.  Clinical Impression  Pt received supine in bed with HOB elevated and brother [name] beside patient. Pt agreeable to participate in PT evaluation today. Pt able to complete bed mobility with CGA, tactile cues provided to patient for appropriate hand placement. Pt with difficulty during bed mobility with appropriate sequencing requiring multiple attempts to get to the seated position. Upon getting to the seated position pt was listing to the R side with increased forward head and kyphosis, requiring tactile cues for hand placement on bed to obtain a more appropriate upright position seated EOB. Pt responded more appropriately to tactile cues versus verbal cues. Seated EOB pt completed cross body reaches x2 BIL. Completed sit<>stand transfer from an elevated surface with minA from therapist, tactile and verbal cues for appropriate hand placement and sequencing with transfer provided to patient. Patient required increased time to complete trasnfer, but was able to come to an upright posture in standing. Completed lateral weight shifts and small alternating marches standing EOB with minA from therapist to maintain patient safety. Pt required seated rest break following 2 minutes of standing. Pt then performed one more sit<>stand transfer requiring less time and tactile cues to come to an upright standing  position. Pt verbalized increased fatigue following standing activity. Overall pt tolerated interventions today, requiring increased time for processing. Currently pt is appropriate for rehab following discharge to progress patient back to ambulatory status and improve LE strength to allow for increased functional mobility.   Follow Up Recommendations SNF    Equipment Recommendations       Recommendations for Other Services       Precautions / Restrictions Precautions Precautions: Fall Restrictions Weight Bearing Restrictions: No      Mobility  Bed Mobility Overal bed mobility: Needs Assistance Bed Mobility: Supine to Sit;Sit to Supine     Supine to sit: Min guard;HOB elevated Sit to supine: Min guard;HOB elevated   General bed mobility comments: Pt able to complete bed mobility with HOB elevated and BIL rails, VCs provided to pt for hand placement and sequencing    Transfers Overall transfer level: Needs assistance Equipment used: Rolling walker (2 wheeled) Transfers: Sit to/from Stand Sit to Stand: Min assist         General transfer comment: Pt requried increased time to complete trasnfers, tactile cue for appropriate hand placement using RW for transfer - demonstrated how to use the EOB to push off of for sit<>stand transfer. Pt able stand EOB with minA and verbal cues for posture as she tends to stand in a crouched position with increased trunk flexion and BIL knee flexion  Ambulation/Gait                Stairs            Wheelchair Mobility    Modified Rankin (Stroke Patients Only)       Balance Overall balance assessment: Needs assistance Sitting-balance support: Bilateral upper extremity supported;Feet supported  Sitting balance-Leahy Scale: Fair Sitting balance - Comments: Pt required tactile cues for appropriate hand placement while seated EOB to sit in a more upright posture - she tends to list to the right side. Following tactile cueing to  improve UE support seated EOB she was able to sit with a more upright posture. Postural control: Right lateral lean Standing balance support: Bilateral upper extremity supported Standing balance-Leahy Scale: Fair Standing balance comment: Pt requires BIL UE support using RW, able to stand for 2 minutes completing lateral weight shifts and small alternating marches EOB                             Pertinent Vitals/Pain Pain Assessment: Faces Faces Pain Scale: Hurts little more Pain Location: Pt reports back pain, she indicated close to the middle thoracic region Pain Descriptors / Indicators: Grimacing Pain Intervention(s): Monitored during session    Home Living Family/patient expects to be discharged to:: Assisted living               Home Equipment: Walker - 2 wheels;Wheelchair - manual      Prior Function Level of Independence: Needs assistance   Gait / Transfers Assistance Needed: According to the pt's brother, she has been declining since her most recent UTI for the last 2 wks. Prior to that she was walking using her RW, but she has not been walking the last two wks.  ADL's / Homemaking Assistance Needed: Pt's facility provides medication management, meal prep and other IADLS  Comments: Pt is a questionable historian and is slightly confused. Pt's brother stated that she is getting closer to her baseline, but she still is a little more incoherent and rambling than she normally is.     Hand Dominance        Extremity/Trunk Assessment        Lower Extremity Assessment Lower Extremity Assessment: Generalized weakness    Cervical / Trunk Assessment Cervical / Trunk Assessment: Kyphotic  Communication   Communication: Other (comment) (Pt mumbles, with some incoherent babbling. She is able to somewhat verbalize coherent, approrpiate sentences.)  Cognition Arousal/Alertness: Awake/alert Behavior During Therapy: WFL for tasks assessed/performed Overall  Cognitive Status: Impaired/Different from baseline (Brother stated that she is closer to baseline today, but she is still slightly more incoherent than normal.) Area of Impairment: Orientation;Attention;Following commands;Safety/judgement;Problem solving;Awareness                 Orientation Level: Disoriented to;Place;Time;Situation Current Attention Level: Alternating Memory: Decreased short-term memory Following Commands: Follows one step commands with increased time Safety/Judgement: Decreased awareness of safety;Decreased awareness of deficits   Problem Solving: Slow processing;Requires verbal cues;Difficulty sequencing;Requires tactile cues;Decreased initiation General Comments: Pt able to follow simple commands with tactile cueing and increased time.      General Comments      Exercises Other Exercises Other Exercises: Completed sit<>stand transfer x2 with minA from therapist, tactile cues provided to pt for appropriate hand placement using RW. Completed standing lateral weight shifts x10 and small alternating marches standing EOB with minA.   Assessment/Plan    PT Assessment Patient needs continued PT services  PT Problem List Decreased strength;Decreased mobility;Decreased safety awareness;Decreased coordination;Decreased knowledge of precautions;Decreased activity tolerance;Decreased cognition;Decreased balance;Decreased knowledge of use of DME;Pain       PT Treatment Interventions DME instruction;Therapeutic exercise;Gait training;Balance training;Functional mobility training;Therapeutic activities;Patient/family education;Neuromuscular re-education    PT Goals (Current goals can be found in the Care Plan section)  Acute Rehab PT Goals Patient Stated Goal: She would like to walk again PT Goal Formulation: With patient Time For Goal Achievement: 11/24/20 Potential to Achieve Goals: Fair    Frequency Min 2X/week   Barriers to discharge        Co-evaluation                AM-PAC PT "6 Clicks" Mobility  Outcome Measure Help needed turning from your back to your side while in a flat bed without using bedrails?: A Little Help needed moving from lying on your back to sitting on the side of a flat bed without using bedrails?: A Little Help needed moving to and from a bed to a chair (including a wheelchair)?: A Lot Help needed standing up from a chair using your arms (e.g., wheelchair or bedside chair)?: A Little Help needed to walk in hospital room?: A Lot Help needed climbing 3-5 steps with a railing? : A Lot 6 Click Score: 15    End of Session Equipment Utilized During Treatment: Gait belt Activity Tolerance: Patient limited by fatigue (Pt limited by LE weakness) Patient left: in bed;with family/visitor present;with call bell/phone within reach   PT Visit Diagnosis: Unsteadiness on feet (R26.81);Difficulty in walking, not elsewhere classified (R26.2);Muscle weakness (generalized) (M62.81)    Time: 1696-7893 PT Time Calculation (min) (ACUTE ONLY): 45 min   Charges:   PT Evaluation $PT Eval Moderate Complexity: 1 Mod PT Treatments $Therapeutic Activity: 8-22 mins        Duanne Guess, PT, DPT 11/10/20, 2:55 PM   Isaias Cowman 11/10/2020, 2:43 PM

## 2020-11-10 NOTE — Progress Notes (Signed)
CSW acknowledges consult for SNF. Will follow for PT recommendations.  Oleh Genin, [place]

## 2020-11-10 NOTE — NC FL2 (Signed)
[place] LEVEL OF CARE SCREENING TOOL     IDENTIFICATION  Patient Name: Danielle Steele Birthdate: 10-10-1940 Sex: female Admission Date (Current Location): 11/08/2020  [place] and [place] Number:  Engineering geologist and Address:  [facility], [address], [place], [place] [address]      Provider Number: [id number]  Attending Physician Name and Address:  Barb Merino, MD  Relative Name and Phone Number:  Ethelyne, Erich (Brother)   [phone] [facility])    Current Level of Care: Hospital Recommended Level of Care: [place] Prior Approval Number:    Date Approved/Denied:   PASRR Number: pending  Discharge Plan:      Current Diagnoses: Patient Active Problem List   Diagnosis Date Noted  . Abdominal pain 11/08/2020  . UTI (urinary tract infection) 11/08/2020  . Sepsis ([place]) 11/08/2020  . Acute metabolic encephalopathy 32/20/2542  . Schizophrenia ([place])   . Bipolar disorder ([place])   . Depression   . Anxiety   . Hypertension   . HLD (hyperlipidemia)   . AKI (acute kidney injury) ([place])   . Abnormal CT scan     Orientation RESPIRATION BLADDER Height & Weight     Self  Normal External catheter Weight: 110 lb (49.9 kg) Height:  5\' 5"  (165.1 cm)  BEHAVIORAL SYMPTOMS/MOOD NEUROLOGICAL BOWEL NUTRITION STATUS        Diet (heart diet; thin liquids)  AMBULATORY STATUS COMMUNICATION OF NEEDS Skin   Limited Assist Verbally Normal                       Personal Care Assistance Level of Assistance  Bathing,Feeding,Dressing Bathing Assistance: Limited assistance Feeding assistance: Independent Dressing Assistance: Limited assistance     Functional Limitations Info             SPECIAL CARE FACTORS FREQUENCY  PT (By licensed PT),OT (By licensed OT)     PT Frequency: 5 x/week OT Frequency: 5 x/week            Contractures      Additional Factors Info  Code Status,Allergies Code Status  Info: partial code Allergies Info: citrus           Current Medications (11/10/2020):  This is the current hospital active medication list Current Facility-Administered Medications  Medication Dose Route Frequency Provider Last Rate Last Admin  . 0.9 %  sodium chloride infusion   Intravenous PRN Barb Merino, MD   Stopped at 11/10/20 1133  . acetaminophen (TYLENOL) tablet 650 mg  650 mg Oral Q6H PRN Ivor Costa, MD      . atorvastatin (LIPITOR) tablet 20 mg  20 mg Oral QPM Ivor Costa, MD   20 mg at 11/09/20 1755  . benztropine (COGENTIN) tablet 0.5 mg  0.5 mg Oral BID Ivor Costa, MD   0.5 mg at 11/10/20 0840  . buPROPion (WELLBUTRIN XL) 24 hr tablet 150 mg  150 mg Oral Jaye Beagle, Soledad Gerlach, MD   150 mg at 11/10/20 0840  . cefTRIAXone (ROCEPHIN) 1 g in sodium chloride 0.9 % 100 mL IVPB  1 g Intravenous Q24H Ivor Costa, MD   Stopped at 11/10/20 1135  . cholecalciferol (VITAMIN D) tablet 2,000 Units  2,000 Units Oral q AM Ivor Costa, MD   2,000 Units at 11/10/20 0840  . clonazePAM (KLONOPIN) tablet 0.5 mg  0.5 mg Oral TID Ivor Costa, MD   0.5 mg at 11/10/20 0840  . Difluprednate 0.05 % EMUL 1 drop  1 drop Ophthalmic BID Ivor Costa, MD      . enoxaparin (LOVENOX) injection 40 mg  40 mg Subcutaneous Q24H Benita Gutter, RPH   40 mg at 11/09/20 2100  . guaiFENesin (ROBITUSSIN) 100 MG/5ML solution 300 mg  300 mg Oral Q6H PRN Ivor Costa, MD      . lactobacillus acidophilus & bulgar (LACTINEX) chewable tablet 1 tablet  1 tablet Oral Daily Ivor Costa, MD      . lithium carbonate capsule 150 mg  150 mg Oral BID WC Ivor Costa, MD   150 mg at 11/10/20 0840  . loperamide (IMODIUM) capsule 4 mg  4 mg Oral PRN Ivor Costa, MD      . loratadine (CLARITIN) tablet 10 mg  10 mg Oral Daily Ivor Costa, MD   10 mg at 11/10/20 0839  . magnesium hydroxide (MILK OF MAGNESIA) suspension 30 mL  30 mL Oral Daily PRN Ivor Costa, MD      . morphine 2 MG/ML injection 0.5 mg  0.5 mg Intravenous Q4H PRN Ivor Costa, MD      .  ondansetron [facility]) injection 4 mg  4 mg Intravenous Q8H PRN Ivor Costa, MD      . traZODone (DESYREL) tablet 50 mg  50 mg Oral QHS Ivor Costa, MD   50 mg at 11/09/20 2101     Discharge Medications: Please see discharge summary for a list of discharge medications.  Relevant Imaging Results:  Relevant Lab Results:   Additional Information SS #: [id number]  [place], LCSW

## 2020-11-10 NOTE — Progress Notes (Signed)
PROGRESS NOTE    Danielle Steele  OZD:[id number] DOB: Oct 19, 1940 DOA: 11/08/2020 PCP: Abby Potash, PA-C    Brief Narrative:  80 year old female with history of hypertension, hyperlipidemia, GERD, depression, anxiety, schizophrenia, bipolar disorder who lives in an assisted living facility brought to the hospital with abdominal pain, hypotension and altered mental status.  Patient does have underlying history of extensive mental health issues but mostly functional and communicating as per family.  According to the brother, patient was treated for UTI ,2 times last month and lately on Bactrim.  Patient also noted to be progressively weak and deconditioned for last month or so.  In the emergency room, WBC count 24.1, urinalysis not very impressive for infection.  Lithium level was therapeutic.  LFTs were normal.  Lactic acid was normal.  Found to have AKI.  Urinary retention more than 750 mL urine and Foley catheter was placed.  Admitted with leukocytosis, altered mental status with unknown cause.   Assessment & Plan:   Principal Problem:   Abdominal pain Active Problems:   UTI (urinary tract infection)   Sepsis (HCC)   Schizophrenia (HCC)   Bipolar disorder (HCC)   Depression   Anxiety   Hypertension   HLD (hyperlipidemia)   AKI (acute kidney injury) (HCC)   Abnormal CT scan   Acute metabolic encephalopathy  Abdominal pain and leukocytosis: Currently resolved.  CT scan abdomen pelvis with no explanation for abdominal pain.  Right upper quadrant ultrasound negative for acute cholecystitis.  Her abdominal pain could possibly been from urinary retention.  Symptomatic treatment.   Foley catheter was removed and now voiding spontaneously.  Incontinent at times.   Presumed UTI, on Rocephin.  We will continue for 3 days of therapy.  She was on antibiotics so urine culture may not grow. Leukocytosis could be stress reaction, will keep Rocephin today.  Altered mental status with history  of multiple mental health issues including schizophrenia, bipolar, depression and anxiety: Lithium level is therapeutic. Patient is on multiple other medications including Cogentin, Wellbutrin, Klonopin that we will continue.  Continue lithium. Metabolic encephalopathy could be from Bactrim or from polypharmacy during dehydration. CT head was normal. Symptomatic treatment.  Mobility.  Encourage oral intake.  PT OT.  Hypertension: Blood pressures stabilized.  Holding lisinopril due to acute kidney injury and hypotension.  Acute kidney injury: Likely due to urinary retention.  Resolved after Foley catheterization and IV fluids.    Abnormal right iliac bone and acetabular CT scan: Irregular periosteum noted in the CT scan.  MRI done , results are pending.   DVT prophylaxis: enoxaparin (LOVENOX) injection 40 mg Start: 11/09/20 2000   Code Status: Partial.  No intubation, okay for CPR. Family Communication: Patient's brother on the phone 4/9. Disposition Plan: Status is: Inpatient  Remains inpatient appropriate because:Hemodynamically unstable and Altered mental status   Dispo: The patient is from: ALF              Anticipated d/c is to: Skilled nursing facility for rehab.              Patient currently is not medically stable to d/c.   Difficult to place patient No         Consultants:   None  Procedures:   None  Antimicrobials:   Rocephin, 4/8----   Subjective: Patient seen and examined.  She was still not able to have clear communication.  She was more quiet and composed today.  Followed simple commands.  Complains of dry mouth.  Objective: Vitals:   11/09/20 1707 11/09/20 1941 11/10/20 0447 11/10/20 0748  BP: (!) 115/54 119/63 (!) 126/55 126/68  Pulse: 81 80 80 89  Resp: (!) 24 16 16  (!) 24  Temp: 98.1 F (36.7 C) 99 F (37.2 C) 97.8 F (36.6 C) 98.7 F (37.1 C)  TempSrc: Oral Oral Oral Oral  SpO2: 98% 100% 96% 98%  Weight:      Height:         Intake/Output Summary (Last 24 hours) at 11/10/2020 1038 Last data filed at 11/10/2020 1012 Gross per 24 hour  Intake 820 ml  Output 100 ml  Net 720 ml   Filed Weights   11/08/20 0907  Weight: 49.9 kg    Examination:  General exam: Patient looks comfortable, frail and debilitated. Dry mucous membrane. Respiratory system: Clear to auscultation. Respiratory effort normal. Cardiovascular system: S1 & S2 heard, RRR. No JVD, murmurs, rubs, gallops or clicks. No pedal edema. Gastrointestinal system: Abdomen is nondistended, soft and nontender. No organomegaly or masses felt. Normal bowel sounds heard. Central nervous system: Patient is alert awake and oriented x1-2.  Her voice is a still incomprehensible. Extremities: Symmetric 5 x 5 power.  Generalized weakness. Skin: No rashes, lesions or ulcers   Data Reviewed: I have personally reviewed following labs and imaging studies  CBC: Recent Labs  Lab 11/08/20 0910 11/09/20 0601 11/10/20 0422  WBC 24.1* 21.3* 21.4*  NEUTROABS 19.1*  --  16.4*  HGB 10.3* 9.8* 10.3*  HCT 31.8* 30.8* 32.5*  MCV 95.5 98.1 98.2  PLT 291 250 161   Basic Metabolic Panel: Recent Labs  Lab 11/08/20 0910 11/09/20 0601 11/10/20 0422  NA 132* 138 140  K 4.2 4.7 3.7  CL 103 113* 112*  CO2 21* 20* 21*  GLUCOSE 108* 126* 100*  BUN 25* 13 14  CREATININE 1.57* 1.04* 0.96  CALCIUM 9.5 9.2 9.8  MG  --   --  2.2  PHOS  --   --  2.1*   GFR: Estimated Creatinine Clearance: 38 mL/min (by C-G formula based on SCr of 0.96 mg/dL). Liver Function Tests: Recent Labs  Lab 11/08/20 0910 11/10/20 0422  AST 18 16  ALT 11 11  ALKPHOS 97 93  BILITOT 0.6 0.4  PROT 6.2* 6.2*  ALBUMIN 2.7* 2.8*   No results for input(s): LIPASE, AMYLASE in the last 168 hours. No results for input(s): AMMONIA in the last 168 hours. Coagulation Profile: Recent Labs  Lab 11/08/20 0910  INR 1.2   Cardiac Enzymes: No results for input(s): CKTOTAL, CKMB, CKMBINDEX,  TROPONINI in the last 168 hours. BNP (last 3 results) No results for input(s): PROBNP in the last 8760 hours. HbA1C: No results for input(s): HGBA1C in the last 72 hours. CBG: No results for input(s): GLUCAP in the last 168 hours. Lipid Profile: No results for input(s): CHOL, HDL, LDLCALC, TRIG, CHOLHDL, LDLDIRECT in the last 72 hours. Thyroid Function Tests: No results for input(s): TSH, T4TOTAL, FREET4, T3FREE, THYROIDAB in the last 72 hours. Anemia Panel: No results for input(s): VITAMINB12, FOLATE, FERRITIN, TIBC, IRON, RETICCTPCT in the last 72 hours. Sepsis Labs: Recent Labs  Lab 11/08/20 0910  PROCALCITON <0.10  LATICACIDVEN 1.9    Recent Results (from the past 240 hour(s))  Urine culture     Status: None   Collection Time: 11/08/20  9:13 AM   Specimen: In/Out Cath Urine  Result Value Ref Range Status   Specimen Description   Final    IN/OUT CATH URINE Performed  at [facility], [address], [place], [place] [address]    Special Requests   Final    NONE Performed at [facility], [address], [place], [place] [address]    Culture   Final    NO GROWTH Performed at [facility], [place] [address] [place], [place] [address]    Report Status 11/09/2020 FINAL  Final  Resp Panel by RT-PCR (Flu A&B, Covid) Nasopharyngeal Swab     Status: None   Collection Time: 11/08/20 10:34 AM   Specimen: Nasopharyngeal Swab; Nasopharyngeal(NP) swabs in vial transport medium  Result Value Ref Range Status   SARS Coronavirus 2 by RT PCR NEGATIVE NEGATIVE Final    Comment: (NOTE) SARS-CoV-2 target nucleic acids are NOT DETECTED.  The SARS-CoV-2 RNA is generally detectable in upper respiratory specimens during the acute phase of infection. The lowest concentration of SARS-CoV-2 viral copies this assay can detect is 138 copies/mL. A negative result does not preclude SARS-Cov-2 infection and should not be used as the sole basis for treatment or other  patient management decisions. A negative result may occur with  improper specimen collection/handling, submission of specimen other than nasopharyngeal swab, presence of viral mutation(s) within the areas targeted by this assay, and inadequate number of viral copies(<138 copies/mL). A negative result must be combined with clinical observations, patient history, and epidemiological information. The expected result is Negative.  Fact Sheet for Patients:  EntrepreneurPulse.com.au  Fact Sheet for Healthcare Providers:  IncredibleEmployment.be  This test is no t yet approved or cleared by the [place] FDA and  has been authorized for detection and/or diagnosis of SARS-CoV-2 by FDA under an Emergency Use Authorization (EUA). This EUA will remain  in effect (meaning this test can be used) for the duration of the COVID-19 declaration under Section 564(b)(1) of the Act, 21 U.S.C.section 360bbb-3(b)(1), unless the authorization is terminated  or revoked sooner.       Influenza A by PCR NEGATIVE NEGATIVE Final   Influenza B by PCR NEGATIVE NEGATIVE Final    Comment: (NOTE) The Xpert Xpress SARS-CoV-2/FLU/RSV plus assay is intended as an aid in the diagnosis of influenza from Nasopharyngeal swab specimens and should not be used as a sole basis for treatment. Nasal washings and aspirates are unacceptable for Xpert Xpress SARS-CoV-2/FLU/RSV testing.  Fact Sheet for Patients: EntrepreneurPulse.com.au  Fact Sheet for Healthcare Providers: IncredibleEmployment.be  This test is not yet approved or cleared by the [place] FDA and has been authorized for detection and/or diagnosis of SARS-CoV-2 by FDA under an Emergency Use Authorization (EUA). This EUA will remain in effect (meaning this test can be used) for the duration of the COVID-19 declaration under Section 564(b)(1) of the Act, 21 U.S.C. section  360bbb-3(b)(1), unless the authorization is terminated or revoked.  Performed at [facility], [address] [place], [place] [address]   CULTURE, BLOOD (ROUTINE X 2) w Reflex to ID Panel     Status: None (Preliminary result)   Collection Time: 11/08/20  1:20 PM   Specimen: BLOOD  Result Value Ref Range Status   Specimen Description BLOOD RIGHT ANTECUBITAL  Final   Special Requests   Final    BOTTLES DRAWN AEROBIC AND ANAEROBIC Blood Culture adequate volume   Culture   Final    NO GROWTH 2 DAYS Performed at [facility], [address] [place], [place] [address]    Report Status PENDING  Incomplete  CULTURE, BLOOD (ROUTINE X 2) w Reflex  to ID Panel     Status: None (Preliminary result)   Collection Time: 11/08/20  1:20 PM   Specimen: BLOOD  Result Value Ref Range Status   Specimen Description BLOOD LEFT ANTECUBITAL  Final   Special Requests   Final    BOTTLES DRAWN AEROBIC AND ANAEROBIC Blood Culture adequate volume   Culture   Final    NO GROWTH 2 DAYS Performed at [facility], [address] [place], [place] [address]    Report Status PENDING  Incomplete  MRSA PCR Screening     Status: None   Collection Time: 11/08/20  9:23 PM   Specimen: Nasal Mucosa; Nasopharyngeal  Result Value Ref Range Status   MRSA by PCR NEGATIVE NEGATIVE Final    Comment:        The GeneXpert MRSA Assay (FDA approved for NASAL specimens only), is one component of a comprehensive MRSA colonization surveillance program. It is not intended to diagnose MRSA infection nor to guide or monitor treatment for MRSA infections. Performed at [facility], [address] [place], [place] [address]          Radiology Studies: CT HEAD WO CONTRAST  Result Date: 11/08/2020 CLINICAL DATA:  Mental status change EXAM: CT HEAD WITHOUT CONTRAST TECHNIQUE: Contiguous axial images were obtained from the base of the skull through the vertex without intravenous  contrast. COMPARISON:  October 10, 2020 FINDINGS: Brain: No evidence of acute territorial infarction, hemorrhage, hydrocephalus,extra-axial collection or mass lesion/mass effect. There is dilatation the ventricles and sulci consistent with age-related atrophy. Low-attenuation changes in the deep white matter consistent with small vessel ischemia. Vascular: No hyperdense vessel or unexpected calcification. Skull: The skull is intact. No fracture or focal lesion identified. Sinuses/Orbits: The visualized paranasal sinuses and mastoid air cells are clear. The orbits and globes intact. Other: None IMPRESSION: No acute intracranial abnormality. Findings consistent with age related atrophy and chronic small vessel ischemia Electronically Signed   By: Prudencio Pair M.D.   On: 11/08/2020 15:58   US ABDOMEN LIMITED RUQ (LIVER/GB)  Result Date: 11/08/2020 CLINICAL DATA:  Right upper quadrant pain EXAM: ULTRASOUND ABDOMEN LIMITED RIGHT UPPER QUADRANT COMPARISON:  None. FINDINGS: Gallbladder: No gallstones or wall thickening visualized. No sonographic Murphy sign noted by sonographer. Common bile duct: Diameter: 7.5 mm, upper normal Liver: No focal lesion identified. Within normal limits in parenchymal echogenicity. Portal vein is patent on color Doppler imaging with normal direction of blood flow towards the liver. Other: None. IMPRESSION: Negative for gallstones. Common bile duct upper normal. No liver lesion. Electronically Signed   By: Franchot Gallo M.D.   On: 11/08/2020 12:45        Scheduled Meds: . atorvastatin  20 mg Oral QPM  . benztropine  0.5 mg Oral BID  . buPROPion  150 mg Oral BH-q7a  . cholecalciferol  2,000 Units Oral q AM  . clonazePAM  0.5 mg Oral TID  . Difluprednate  1 drop Ophthalmic BID  . enoxaparin (LOVENOX) injection  40 mg Subcutaneous Q24H  . lactobacillus acidophilus & bulgar  1 tablet Oral Daily  . lithium carbonate  150 mg Oral BID WC  . loratadine  10 mg Oral Daily  . traZODone   50 mg Oral QHS   Continuous Infusions: . cefTRIAXone (ROCEPHIN)  IV Stopped (11/09/20 1203)     LOS: 2 days    Time spent: 30 minutes    Barb Merino, MD [facility] Pager [phone]

## 2020-11-10 NOTE — TOC Initial Note (Signed)
Transition of Care [facility]) - Initial/Assessment Note    Patient Details  Name: Danielle Steele MRN: [id number] Date of Birth: 1941/04/06  [facility]) CM/SW Contact:    Magnus Ivan, LCSW Phone Number: 11/10/2020, 4:23 PM  Clinical Narrative:                PT recommending SNF. CSW spoke with patient's brother. Patient lives at [facility] (see CSW previous note for this weekend). Patient uses a RW at baseline but has been using a w/c recently. Patient has had both COVID vaccines, and brother thinks she has had the booster as well but will check. He is agreeable to SNF recommendation. He reported he would like [facility] (1st), [facility] (2nd), or [facility] (3rd). CSW is starting SNF work up.   Expected Discharge Plan: Skilled Nursing Facility Barriers to Discharge: Continued Medical Work up   Patient Goals and CMS Choice Patient states their goals for this hospitalization and ongoing recovery are:: SNF rehab CMS Medicare.gov Compare Post Acute Care list provided to:: Patient Represenative (must comment) Choice offered to / list presented to : Sibling  Expected Discharge Plan and Services Expected Discharge Plan: [place]       Living arrangements for the past 2 months: [place]                                      Prior Living Arrangements/Services Living arrangements for the past 2 months: [place] Lives with:: Siblings   Do you feel safe going back to the place where you live?: Yes      Need for Family Participation in Patient Care: Yes (Comment) Care giver support system in place?: Yes (comment) Current home services: DME Criminal Activity/Legal Involvement Pertinent to Current Situation/Hospitalization: No - Comment as needed  Activities of Daily Living Home Assistive Devices/Equipment: Walker (specify type),Wheelchair ADL Screening (condition at time of admission) Patient's  cognitive ability adequate to safely complete daily activities?: Yes Is the patient deaf or have difficulty hearing?: Yes Does the patient have difficulty seeing, even when wearing glasses/contacts?: No Does the patient have difficulty concentrating, remembering, or making decisions?: Yes Patient able to express need for assistance with ADLs?: Yes Does the patient have difficulty dressing or bathing?: Yes Independently performs ADLs?: No Communication: Independent Dressing (OT): Appropriate for developmental age Grooming: Appropriate for developmental age Feeding: Independent Bathing: Needs assistance Is this a change from baseline?: Pre-admission baseline Toileting: Needs assistance Is this a change from baseline?: Pre-admission baseline In/Out Bed: Needs assistance Is this a change from baseline?: Change from baseline, expected to last <3 days Walks in Home: Needs assistance Is this a change from baseline?: Pre-admission baseline Does the patient have difficulty walking or climbing stairs?: Yes Weakness of Legs: Both Weakness of Arms/Hands: Both  Permission Sought/Granted Permission sought to share information with : Chartered certified accountant granted to share information with : Yes, Verbal Permission Granted (by brother)              Emotional Assessment       Orientation: : Fluctuating Orientation (Suspected and/or reported Sundowners) Alcohol / Substance Use: Not Applicable Psych Involvement: No (comment)  Admission diagnosis:  RUQ abdominal pain [R10.11] AKI (acute kidney injury) ([place]) [N17.9] Abdominal pain [R10.9] Hematuria, unspecified type [R31.9] Patient Active Problem List   Diagnosis Date Noted  . Abdominal pain 11/08/2020  .  UTI (urinary tract infection) 11/08/2020  . Sepsis ([place]) 11/08/2020  . Acute metabolic encephalopathy 50/10/7046  . Schizophrenia ([place])   . Bipolar disorder ([place])   . Depression   . Anxiety   . Hypertension   .  HLD (hyperlipidemia)   . AKI (acute kidney injury) ([place])   . Abnormal CT scan    PCP:  Abby Potash, PA-C Pharmacy:  No Pharmacies Listed    Social Determinants of Health (SDOH) Interventions    Readmission Risk Interventions No flowsheet data found.

## 2020-11-11 ENCOUNTER — Inpatient Hospital Stay: Payer: Medicare Other

## 2020-11-11 DIAGNOSIS — N179 Acute kidney failure, unspecified: Secondary | ICD-10-CM | POA: Diagnosis not present

## 2020-11-11 DIAGNOSIS — R1011 Right upper quadrant pain: Secondary | ICD-10-CM | POA: Diagnosis not present

## 2020-11-11 DIAGNOSIS — C801 Malignant (primary) neoplasm, unspecified: Secondary | ICD-10-CM | POA: Diagnosis not present

## 2020-11-11 DIAGNOSIS — G9341 Metabolic encephalopathy: Secondary | ICD-10-CM | POA: Diagnosis not present

## 2020-11-11 DIAGNOSIS — R591 Generalized enlarged lymph nodes: Secondary | ICD-10-CM

## 2020-11-11 DIAGNOSIS — C7951 Secondary malignant neoplasm of bone: Secondary | ICD-10-CM | POA: Diagnosis not present

## 2020-11-11 DIAGNOSIS — R9389 Abnormal findings on diagnostic imaging of other specified body structures: Secondary | ICD-10-CM | POA: Diagnosis not present

## 2020-11-11 LAB — CBC WITH DIFFERENTIAL/PLATELET
Abs Immature Granulocytes: 0.27 10*3/uL — ABNORMAL HIGH (ref 0.00–0.07)
Basophils Absolute: 0.1 10*3/uL (ref 0.0–0.1)
Basophils Relative: 1 %
Eosinophils Absolute: 1.7 10*3/uL — ABNORMAL HIGH (ref 0.0–0.5)
Eosinophils Relative: 7 %
HCT: 35.3 % — ABNORMAL LOW (ref 36.0–46.0)
Hemoglobin: 11.8 g/dL — ABNORMAL LOW (ref 12.0–15.0)
Immature Granulocytes: 1 %
Lymphocytes Relative: 5 %
Lymphs Abs: 1.1 10*3/uL (ref 0.7–4.0)
MCH: 31.7 pg (ref 26.0–34.0)
MCHC: 33.4 g/dL (ref 30.0–36.0)
MCV: 94.9 fL (ref 80.0–100.0)
Monocytes Absolute: 1.9 10*3/uL — ABNORMAL HIGH (ref 0.1–1.0)
Monocytes Relative: 8 %
Neutro Abs: 18.9 10*3/uL — ABNORMAL HIGH (ref 1.7–7.7)
Neutrophils Relative %: 78 %
Platelets: 318 10*3/uL (ref 150–400)
RBC: 3.72 MIL/uL — ABNORMAL LOW (ref 3.87–5.11)
RDW: 13.5 % (ref 11.5–15.5)
WBC: 24.1 10*3/uL — ABNORMAL HIGH (ref 4.0–10.5)
nRBC: 0 % (ref 0.0–0.2)

## 2020-11-11 IMAGING — CT CT GUIDANCE NEEDLE PLACEMENT
2 series · 10 of 14 positions shown, 12 images · non-contrast
Comparison: CT the chest, abdomen and pelvis-earlier same day;

INDICATION: No known primary, now with multiple of rest of osseous lesions
worrisome for metastatic disease. Please perform CT-guided biopsy of
permeative lesion involving the anterior aspect of the right ilium
for tissue diagnostic purposes.

[Series 2: i-spiral 5.0 b30f · axial · 0.60mm/px · z∈[-129,-48]mm · 7 of 31 slices shown, 9 images]
[im 4/31  soft-tissue]
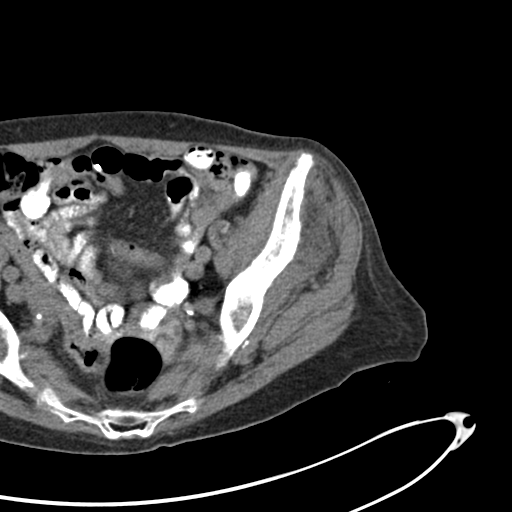
[im 4/31  bone]
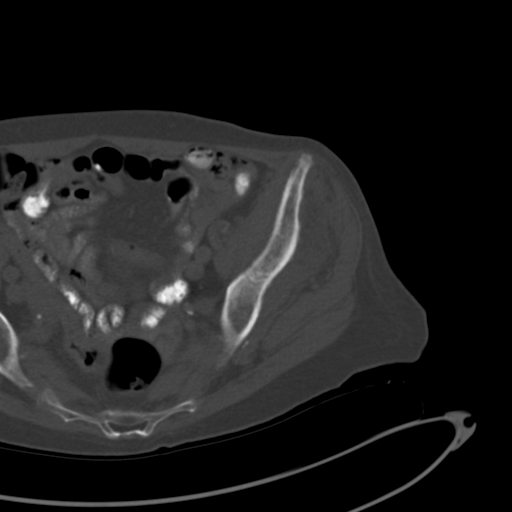
[im 8/31  bone]
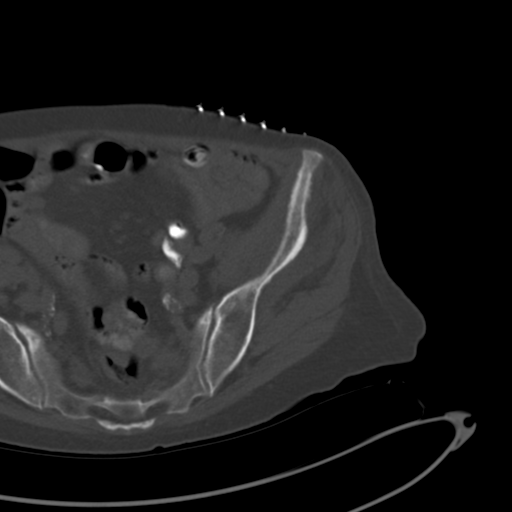
[im 12/31  bone]
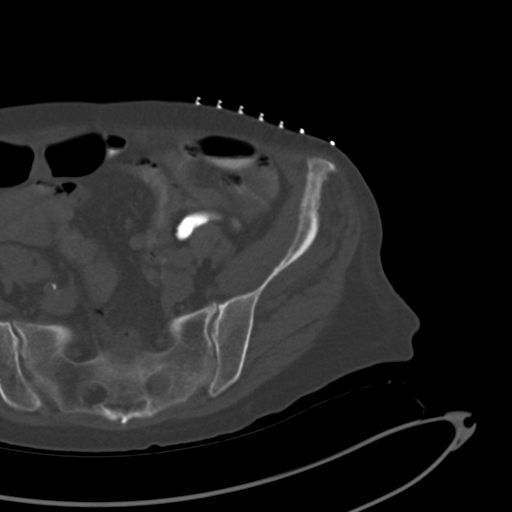
[im 16/31  bone]
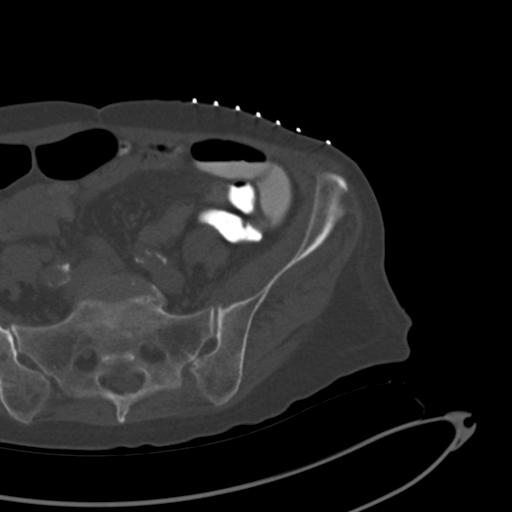
[im 19/31  soft-tissue]
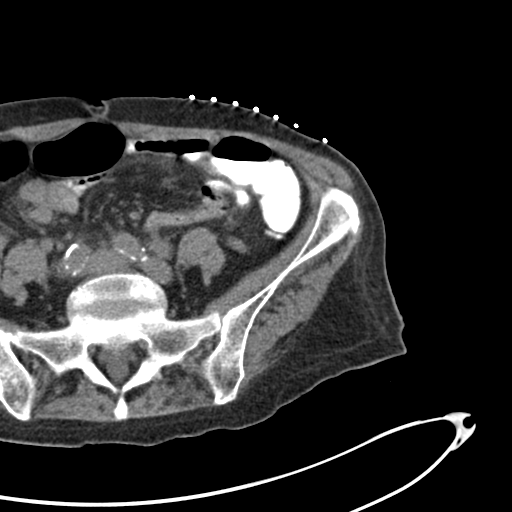
[im 19/31  bone]
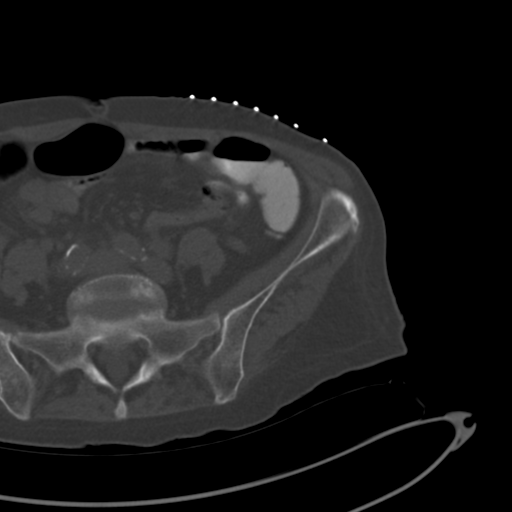
[im 23/31  bone]
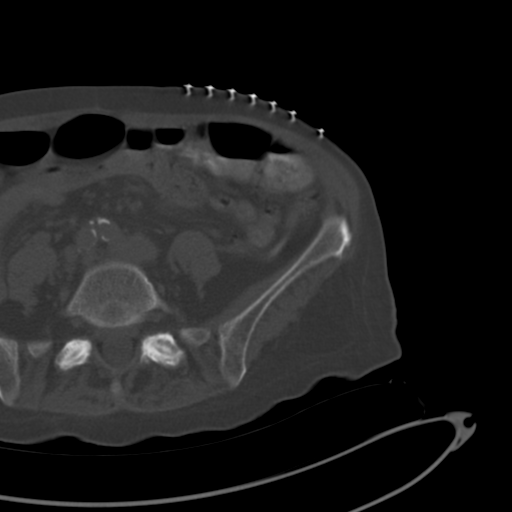
[im 27/31  bone]
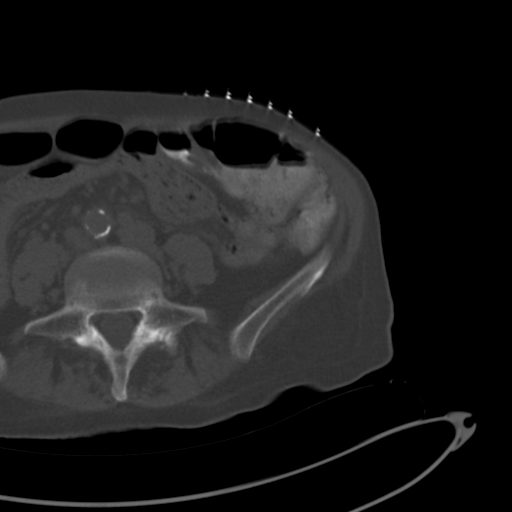

[Series 3: i-sequence 4.8 b30s · axial · 0.60mm/px · z∈[-106,-96]mm · 3 of 15 slices shown]
[im 4/15  bone]
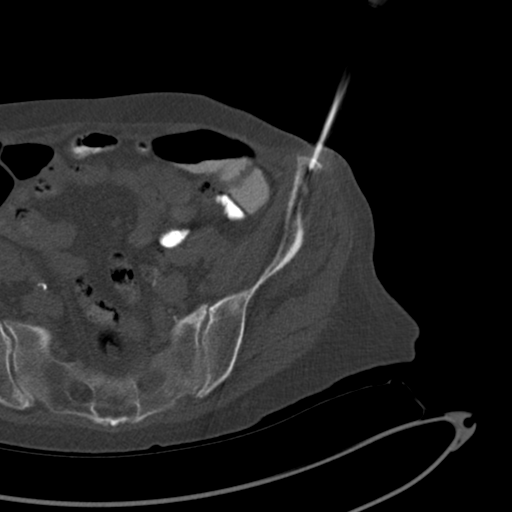
[im 8/15  bone]
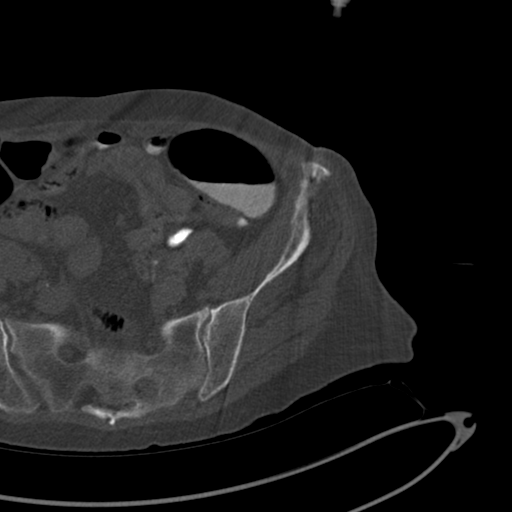
[im 11/15  bone]
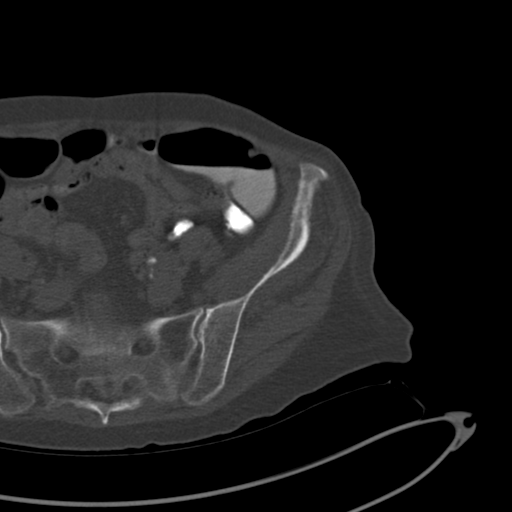

[10 of 14 positions shown; findings below may reference images not displayed]

Note, patient also found to have malignant-appearing retroperitoneal
lymph nodes however given the size and location of the periaortic
lymph nodes decision was made to initially proceed with CT-guided
bone lesion biopsy in hopes this biopsy will provided tissue
diagnosis in this patient with history of schizophrenia and
uncertain ability to tolerate and/or cooperate with a CT-guided
biopsy.

EXAM:
CT-GUIDED BIOPSY INVOLVING THE ANTERIOR ASPECT OF THE RIGHT ILIUM.

MEDICATIONS:
None
pelvic MRI-[DATE]

ANESTHESIA/SEDATION:
Fentanyl 50 mcg IV; Versed 1 mg IV

Sedation time: 18 minutes; The patient was continuously monitored
during the procedure by the interventional radiology nurse under my
direct supervision.

COMPLICATIONS:
None immediate.

PROCEDURE:
Informed consent was obtained from the patient's family following an
explanation of the procedure, risks, benefits and alternatives. The
patient understands, agrees and consents for the procedure. All
questions were addressed. A time out was performed prior to the
initiation of the procedure.

The patient was positioned supine on the CT table and a limited CT
was performed for procedural planning demonstrating a permeative
lesion involving the anterior aspect of the right ilium. The
procedure was planned. The operative site was prepped and draped in
the usual sterile fashion. Appropriate trajectory was confirmed with
a 22 gauge spinal needle after the adjacent tissues were
anesthetized with 1% Lidocaine with epinephrine.

Next, an 11 gauge coaxial bone biopsy needle was advanced into
anterior aspect of the right ilium. The inner 13 gauge coil axial
bone biopsy device was then utilized to acquire the initial sample
after appropriate position was confirmed with CT imaging (image 11,
series 3). Finally, the outer 11 gauge bone biopsy device was
utilized to acquire an additional sample (image 15, series 3).

The needle was removed and superficial hemostasis was obtained with
manual compression. A dressing was applied. The patient tolerated
the procedure well without immediate post procedural complication.
IMPRESSION: Successful CT guided biopsy of permanent lesion involving the
anterior aspect of the right ilium.

PLAN:
As above, CT-guided bone lesion biopsy was initially pursued given
patient's history of schizophrenia and uncertain ability to tolerate
and/or cooperate with CT-guided biopsy however ultimately if
additional tissue is required attempted CT-guided retroperitoneal
lymph node biopsy could be performed as indicated.

## 2020-11-11 IMAGING — CT CT CHEST-ABD-PELV W/ CM
2 of 5 series · 12 of 36 positions shown, 14 images · IV contrast (omnipaque)
Comparison: Noncontrast CT abdomen pelvis [DATE],

CLINICAL DATA: Gastrointestinal cancer. Osseous metastatic disease.
Initial staging examination

EXAM:
CT CHEST, ABDOMEN, AND PELVIS WITH CONTRAST
TECHNIQUE: Multidetector CT imaging of the chest, abdomen and pelvis was
performed following the standard protocol during bolus
administration of intravenous contrast.
CONTRAST:  100mL OMNIPAQUE IOHEXOL 300 MG/ML  SOLN

[Series 2: cap with · axial · 0.87mm/px · z∈[-1176,-656]mm · 9 of 128 slices shown, 11 images]
[im 12/128  mediastinal]
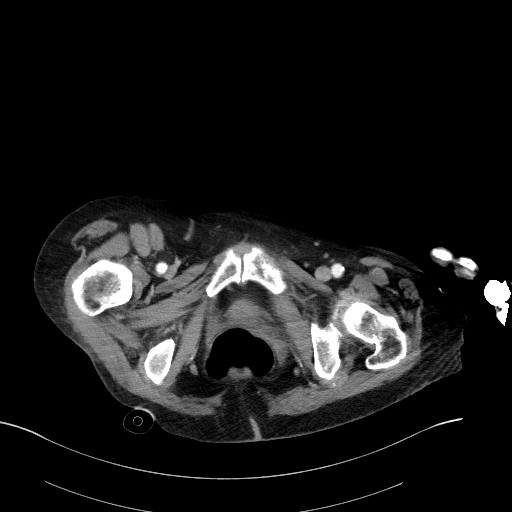
[im 12/128  bone]
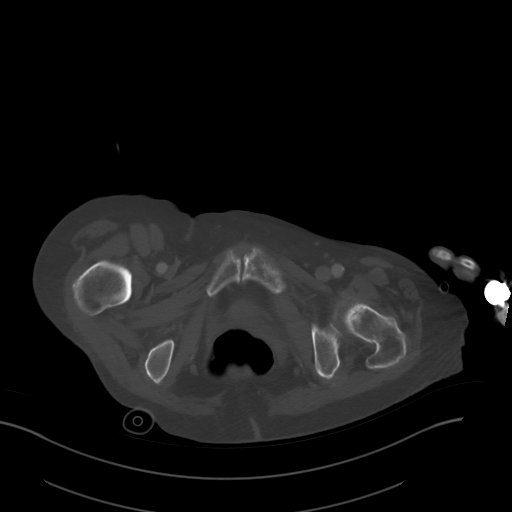
[im 24/128  mediastinal]
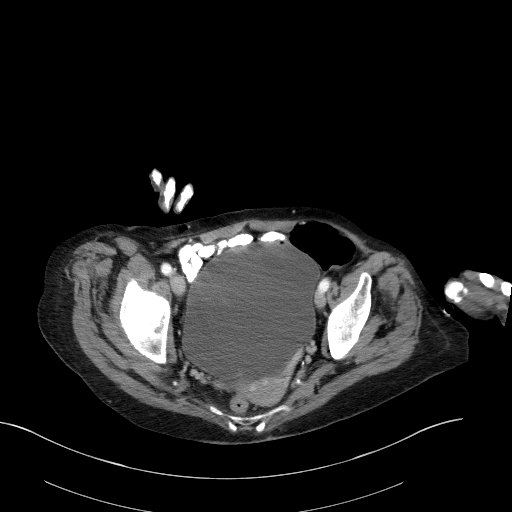
[im 35/128  mediastinal]
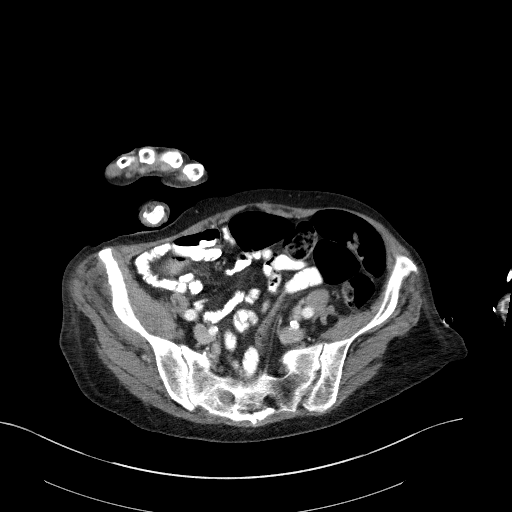
[im 47/128  mediastinal]
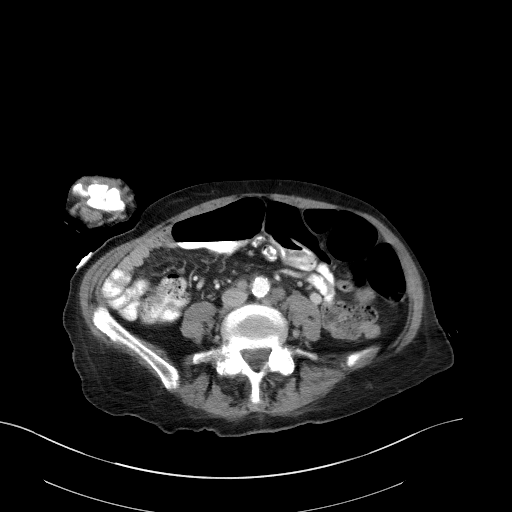
[im 70/128  mediastinal]
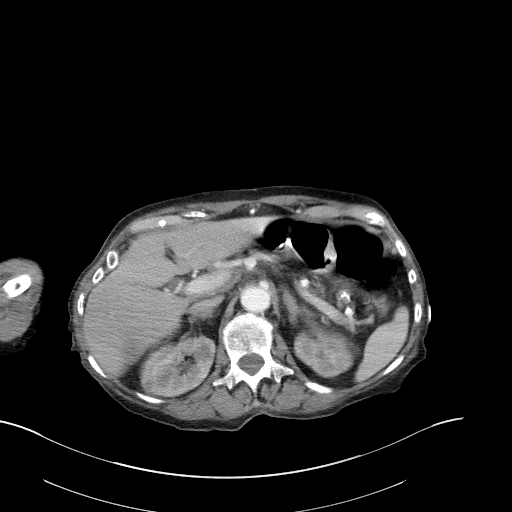
[im 81/128  mediastinal]
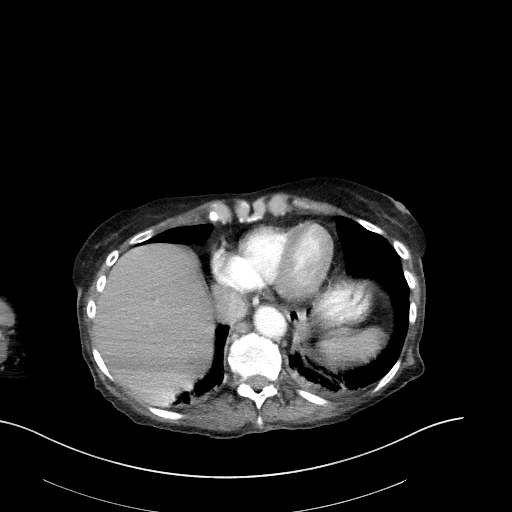
[im 93/128  mediastinal]
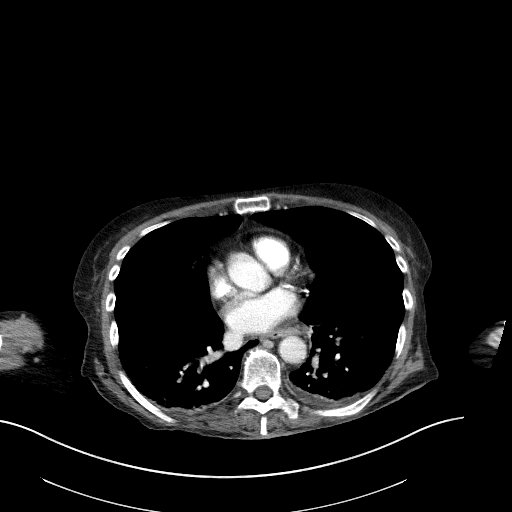
[im 104/128  mediastinal]
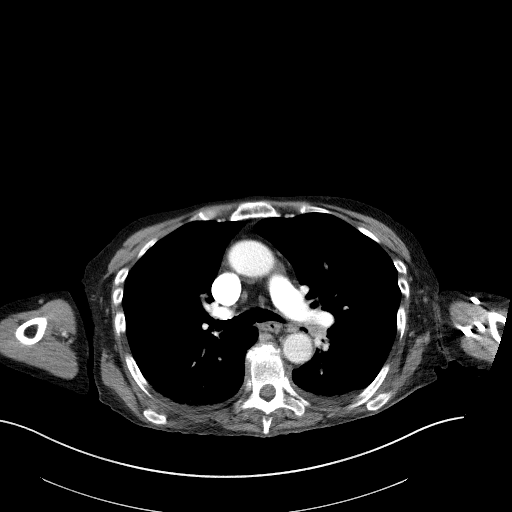
[im 116/128  mediastinal]
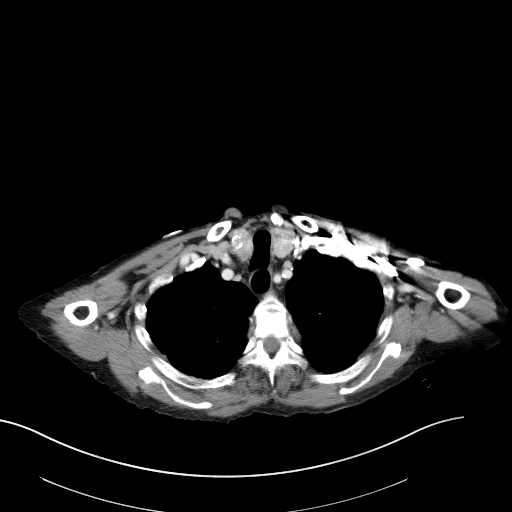
[im 116/128  bone]
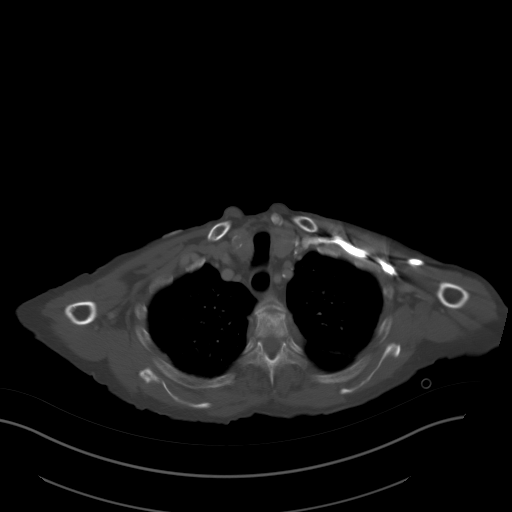

[Series 5: coronals · coronal · 0.76mm/px · 3 of 143 slices shown]
[im 29/143  mediastinal]
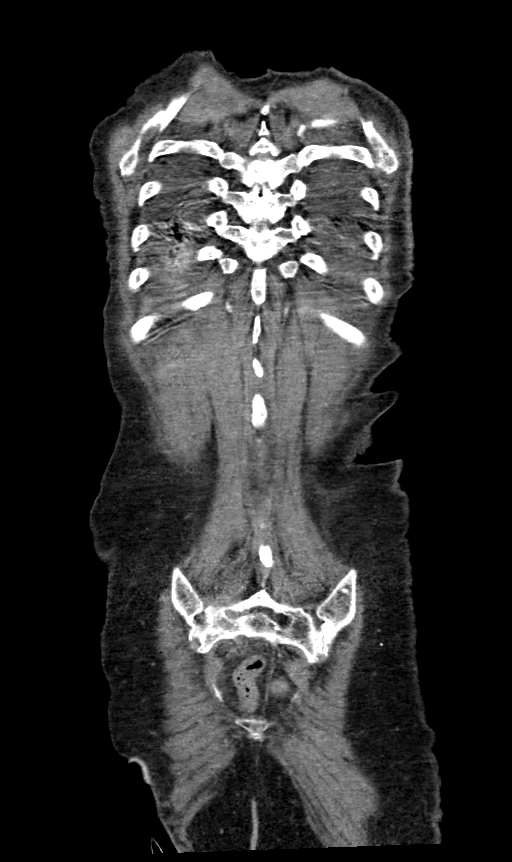
[im 57/143  mediastinal]
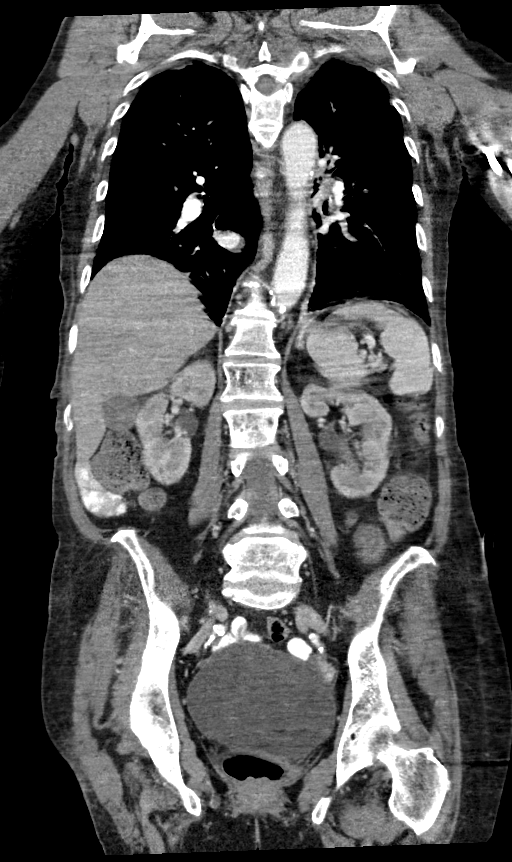
[im 86/143  mediastinal]
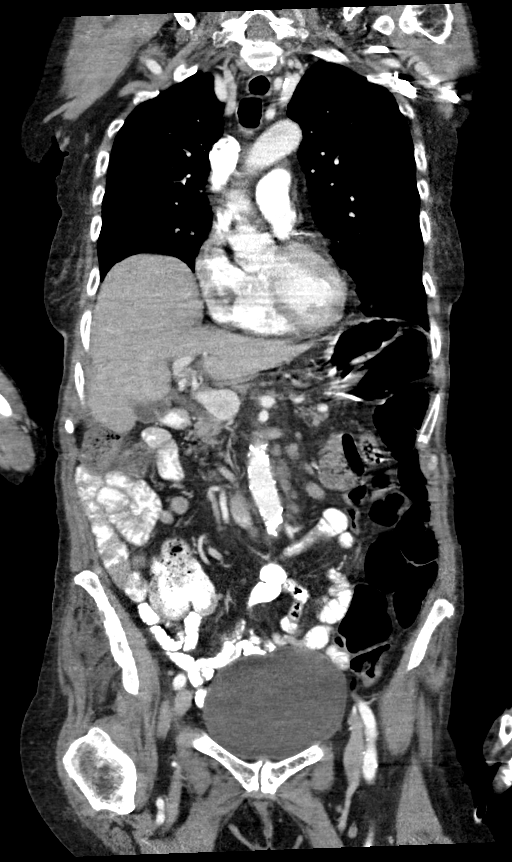

[12 of 36 positions shown; findings below may reference images not displayed]

FINDINGS: CT CHEST FINDINGS

Cardiovascular: Extensive multi-vessel coronary artery
calcification. Global cardiac size within normal limits. No
pericardial effusion. The central pulmonary arteries are of normal
caliber. Mild atherosclerotic calcification within the thoracic
aorta. No aortic aneurysm.

Mediastinum/Nodes: There is necrotic appearing left hilar and
subcarinal adenopathy. By example, left hilar adenopathy measures
2.3 x 2.5 cm at axial image # [DATE]. Subcarinal adenopathy measures
1.5 x 2.9 cm at axial image # [DATE]. The esophagus is unremarkable.

Lungs/Pleura: Mild centrilobular emphysema. Mild bibasilar
atelectasis. No focal pulmonary nodules or infiltrates. No
pneumothorax or pleural effusion. Left hilar adenopathy results in
circumferential narrowing of the left lower lobar pulmonary bronchi.

Musculoskeletal: Pathologic appearing compression fracture of T6
with near vertebral plana deformity. Minimal retropulsion of the
posteroinferior component of the T6 vertebral body resulting in
minimal central canal stenosis. Mixed lytic and sclerotic pattern
within the a T3 vertebral body may reflect the presence of
underlying metastatic disease in this location. No definite cortical
erosion. No associated pathologic fracture.

CT ABDOMEN PELVIS FINDINGS

Hepatobiliary: No focal liver abnormality is seen. No gallstones,
gallbladder wall thickening, or biliary dilatation.

Pancreas: Unremarkable

Spleen: Unremarkable

Adrenals/Urinary Tract: The adrenal glands are unremarkable. The
kidneys are normal in size and position. Multiple cortical
hypodensities are seen within the kidneys bilaterally which are too
small to characterize. A a 17 mm cortical hypodensity, however,
within the posterior interpolar region of the left kidney appears
solid in nature and may represent a hypoenhancing mass, but is not
optimally characterized on this exam. No hydronephrosis. No
intrarenal or ureteral calculi. The bladder is unremarkable.

Stomach/Bowel: There is polypoid fold thickening within the a mid
body of the stomach, best seen on axial image # 53/2 and sagittal
image # 122/6 and an underlying gastric mass is difficult to
exclude. The stomach, small bowel, and large bowel are otherwise
unremarkable save for moderate stool seen throughout the colon. No
evidence of obstruction or focal inflammation. The appendix is not
clearly identified and may be absent. No free intraperitoneal gas or
fluid.

Vascular/Lymphatic: Extensive aortoiliac atherosclerotic
calcification. No aortic aneurysm. There is pathologic
retroperitoneal adenopathy within the retrocrural, left periaortic,
aortocaval, and retrocaval lymph node groups as well as the common
iliac lymph node groups bilaterally. Index lymph node within the
left periaortic lymph node group measures 1.9 x 2.0 cm at axial
image # 74.

Reproductive: Uterus and bilateral adnexa are unremarkable.

Other: No abdominal wall hernia.  Rectum unremarkable.

Musculoskeletal: Mild sclerosis involving the right ilium and left
pubic symphysis. No associated pathologic fracture. With associated
periosteal reaction is again identified, better assessed on
accompanying MRI examination. The majority of these lesions,
however, appear radiographically occult on this exam
IMPRESSION: Pathologic left hilar, subcarinal, retrocrural, and retroperitoneal
lymphadenopathy. Left periaortic lymphadenopathy may provide a
viable target for CT-guided biopsy for tissue sampling.

Polypoid fold thickening involving the mid body of the stomach. An
underlying primary gastric mass is difficult to exclude on this
examination. This would be better assessed with endoscopy or upper
gastrointestinal series.

17 mm hypoenhancing suspected solid mass within the left kidney.
Dedicated renal sonography may be helpful, as an initial step, for
further evaluation.

Multiple osseous lesions in keeping with osseous metastatic disease.
The majority of these lesions within the pelvis noted on prior MRI
examination are radiographically occult. T6 suspected pathologic
fracture. Additional possible metastasis involving the T3 vertebral
body. This could be confirmed with contrast enhanced MRI
examination.

Extensive multi-vessel coronary artery calcification.

Mild centrilobular emphysema.

Aortic Atherosclerosis ([N2]-[N2]) and Emphysema ([N2]-[N2]).

## 2020-11-11 MED ORDER — IOHEXOL 300 MG/ML  SOLN
100.0000 mL | Freq: Once | INTRAMUSCULAR | Status: AC | PRN
  Administered 2020-11-11: 100 mL via INTRAVENOUS

## 2020-11-11 MED ORDER — FENTANYL CITRATE (PF) 100 MCG/2ML IJ SOLN
INTRAMUSCULAR | Status: AC | PRN
  Administered 2020-11-11: 50 ug via INTRAVENOUS

## 2020-11-11 MED ORDER — FENTANYL CITRATE (PF) 100 MCG/2ML IJ SOLN
INTRAMUSCULAR | Status: AC
  Filled 2020-11-11: qty 2

## 2020-11-11 MED ORDER — ENOXAPARIN SODIUM 40 MG/0.4ML ~~LOC~~ SOLN
40.0000 mg | SUBCUTANEOUS | Status: DC
  Administered 2020-11-12 – 2020-11-15 (×4): 40 mg via SUBCUTANEOUS
  Filled 2020-11-11 (×4): qty 0.4

## 2020-11-11 MED ORDER — MIDAZOLAM HCL 2 MG/2ML IJ SOLN
INTRAMUSCULAR | Status: AC | PRN
  Administered 2020-11-11: 1 mg via INTRAVENOUS

## 2020-11-11 MED ORDER — CLONAZEPAM 0.5 MG PO TABS: 0.5000 mg | ORAL_TABLET | Freq: Two times a day (BID) | ORAL | Status: DC | PRN

## 2020-11-11 MED ORDER — MIDAZOLAM HCL 5 MG/5ML IJ SOLN
INTRAMUSCULAR | Status: AC
  Filled 2020-11-11: qty 5

## 2020-11-11 MED ORDER — MAGNESIUM CITRATE PO SOLN
1.0000 | Freq: Once | ORAL | Status: AC
  Administered 2020-11-11: 1 via ORAL
  Filled 2020-11-11: qty 296

## 2020-11-11 NOTE — Progress Notes (Signed)
PROGRESS NOTE    Danielle Steele  GHW:[id number] DOB: 07/24/1941 DOA: 11/08/2020 PCP: Abby Potash, PA-C    Brief Narrative:  80 year old female with history of hypertension, hyperlipidemia, GERD, depression, anxiety, schizophrenia, bipolar disorder who lives in an assisted living facility brought to the hospital with abdominal pain, hypotension and altered mental status.  Patient does have underlying history of extensive mental health issues but mostly functional and communicating as per family.  According to the brother, patient was treated for UTI ,2 times last month and lately on Bactrim.  Patient also noted to be progressively weak and deconditioned for last month or so.  In the emergency room, WBC count 24.1, urinalysis not very impressive for infection.  Lithium level was therapeutic.  LFTs were normal.  Lactic acid was normal.  Found to have AKI.  Urinary retention more than 750 mL urine and Foley catheter was placed.  Admitted with leukocytosis, altered mental status with unknown cause. 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 4/11, iliac crest biopsy.   Assessment & Plan:   Principal Problem:   Abdominal pain Active Problems:   UTI (urinary tract infection)   Sepsis (HCC)   Schizophrenia (HCC)   Bipolar disorder (HCC)   Depression   Anxiety   Hypertension   HLD (hyperlipidemia)   AKI (acute kidney injury) (HCC)   Abnormal CT scan   Acute metabolic encephalopathy  Abdominal pain and leukocytosis: Currently chest pain-free.  Patient was found to have metastatic lesion as below.  Urine culture negative.  Rocephin day 4 today, discontinue further.  Urinary retention resolved.  Symptomatic treatment..  Altered mental status with history of multiple mental health issues including schizophrenia, bipolar, depression and anxiety: Lithium level is therapeutic. Patient is on multiple other medications  including Cogentin, Wellbutrin, Klonopin that we will continue.  Continue lithium. Metabolic encephalopathy could be from Bactrim or from polypharmacy during dehydration. CT head was normal. Symptomatic treatment.  Mobility.  Encourage oral intake.  PT OT.  Hypertension: Blood pressures stabilized.  Holding lisinopril due to acute kidney injury and hypotension.  Patient will probably not need antihypertensives.  Acute kidney injury: Likely due to urinary retention.  Resolved after Foley catheterization and IV fluids.    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 CT scan of the chest abdomen pelvis with extensive lymphadenopathy both side of the diaphragm. CT-guided biopsy 4/11 with radiology. Discussed with oncology for consultation. Leukocytosis probably due to underlying malignancy.  Updated patient's brother who is healthcare power of attorney.  We should wait for biopsy, oncology consultation and meantime we should transfer her to skilled nursing facility to continue attempt to rehab pending cancer diagnosis and treatment if any possible given her frailty / mental health issues.   DVT prophylaxis: enoxaparin (LOVENOX) injection 40 mg Start: 11/12/20 1000   Code Status: Partial.  No intubation, okay for CPR. Family Communication: Patient's brother on the phone. Disposition Plan: Status is: Inpatient  Remains inpatient appropriate because:Hemodynamically unstable and Altered mental status   Dispo: The patient is from: ALF              Anticipated d/c is to: Skilled nursing facility for rehab.              Patient currently is medically stable for discharge.  Can transfer tomorrow.   Difficult to place patient No         Consultants:   Oncology  Procedures:   Biopsy  Antimicrobials:    Rocephin, 4/8----4/11   Subjective: Patient seen and examined.  Still confused, somehow impulsive at times.  Does not know where she is.  Objective: Vitals:   11/11/20 1025 11/11/20 1028 11/11/20 1100 11/11/20 1329  BP: 136/60 136/66 129/61 123/61  Pulse: 82 79 75 85  Resp: (!) 25 20  20   Temp:  (!) 97.3 F (36.3 C)  97.7 F (36.5 C)  TempSrc:  Oral  Oral  SpO2: 99% 99% (!) 89% 96%  Weight:      Height:        Intake/Output Summary (Last 24 hours) at 11/11/2020 1418 Last data filed at 11/11/2020 1329 Gross per 24 hour  Intake 244.02 ml  Output 900 ml  Net -655.98 ml   Filed Weights   11/08/20 0907  Weight: 49.9 kg    Examination:  General: Frail and debilitated.  Chronically sick looking.  Not in any distress. Patient is alert and awake but not oriented.  She has incomprehensible speech and occasionally impulsive and agitated. Cardiovascular: S1-S2 normal.  No added sounds. Respiratory: Bilateral clear.  No added sounds. Gastrointestinal: Soft.  Nontender.  Bowel sounds present. Ext: No cyanosis, no edema. Neuro: No focal deficits.  Impulsive and occasionally agitated. Musculoskeletal: No joint deformities.   Data Reviewed: I have personally reviewed following labs and imaging studies  CBC: Recent Labs  Lab 11/08/20 0910 11/09/20 0601 11/10/20 0422 11/11/20 0831  WBC 24.1* 21.3* 21.4* 24.1*  NEUTROABS 19.1*  --  16.4* 18.9*  HGB 10.3* 9.8* 10.3* 11.8*  HCT 31.8* 30.8* 32.5* 35.3*  MCV 95.5 98.1 98.2 94.9  PLT 291 250 304 160   Basic Metabolic Panel: Recent Labs  Lab 11/08/20 0910 11/09/20 0601 11/10/20 0422  NA 132* 138 140  K 4.2 4.7 3.7  CL 103 113* 112*  CO2 21* 20* 21*  GLUCOSE 108* 126* 100*  BUN 25* 13 14  CREATININE 1.57* 1.04* 0.96  CALCIUM 9.5 9.2 9.8  MG  --   --  2.2  PHOS  --   --  2.1*   GFR: Estimated Creatinine Clearance: 38 mL/min (by C-G formula based on SCr of 0.96 mg/dL). Liver Function Tests: Recent Labs  Lab  11/08/20 0910 11/10/20 0422  AST 18 16  ALT 11 11  ALKPHOS 97 93  BILITOT 0.6 0.4  PROT 6.2* 6.2*  ALBUMIN 2.7* 2.8*   No results for input(s): LIPASE, AMYLASE in the last 168 hours. No results for input(s): AMMONIA in the last 168 hours. Coagulation Profile: Recent Labs  Lab 11/08/20 0910  INR 1.2   Cardiac Enzymes: No results for input(s): CKTOTAL, CKMB, CKMBINDEX, TROPONINI in the last 168 hours. BNP (last 3 results) No results for input(s): PROBNP in the last 8760 hours. HbA1C: No results for input(s): HGBA1C in the last 72 hours. CBG: No results for input(s): GLUCAP in the last 168 hours. Lipid Profile: No results for input(s): CHOL, HDL, LDLCALC, TRIG, CHOLHDL, LDLDIRECT in the last 72 hours. Thyroid Function Tests: No results for input(s): TSH, T4TOTAL, FREET4, T3FREE, THYROIDAB in the last 72 hours. Anemia Panel: No results for input(s): VITAMINB12, FOLATE, FERRITIN, TIBC, IRON, RETICCTPCT in the last 72 hours. Sepsis Labs: Recent Labs  Lab 11/08/20 0910  PROCALCITON <0.10  LATICACIDVEN 1.9    Recent Results (from the past 240 hour(s))  Urine culture     Status: None   Collection Time: 11/08/20  9:13 AM   Specimen: In/Out Cath Urine  Result Value Ref Range Status   Specimen Description   Final    IN/OUT CATH URINE Performed at [facility], [address] [place], [place] [address]    Special Requests   Final    NONE Performed at [facility], [address], [place], [place] [address]    Culture   Final    NO GROWTH Performed at [facility], [place] [address] [place], [place] [address]    Report Status 11/09/2020 FINAL  Final  Resp Panel by RT-PCR (Flu A&B, Covid) Nasopharyngeal Swab     Status: None   Collection Time: 11/08/20 10:34 AM   Specimen: Nasopharyngeal Swab; Nasopharyngeal(NP) swabs in vial transport medium  Result Value Ref Range Status   SARS Coronavirus 2 by RT PCR NEGATIVE NEGATIVE Final    Comment:  (NOTE) SARS-CoV-2 target nucleic acids are NOT DETECTED.  The SARS-CoV-2 RNA is generally detectable in upper respiratory specimens during the acute phase of infection. The lowest concentration of SARS-CoV-2 viral copies this assay can detect is 138 copies/mL. A negative result does not preclude SARS-Cov-2 infection and should not be used as the sole basis for treatment or other patient management decisions. A negative result may occur with  improper specimen collection/handling, submission of specimen other than nasopharyngeal swab, presence of viral mutation(s) within the areas targeted by this assay, and inadequate number of viral copies(<138 copies/mL). A negative result must be combined with clinical observations, patient history, and epidemiological information. The expected result is Negative.  Fact Sheet for Patients:  EntrepreneurPulse.com.au  Fact Sheet for Healthcare Providers:  IncredibleEmployment.be  This test is no t yet approved or cleared by the [place] FDA and  has been authorized for detection and/or diagnosis of SARS-CoV-2 by FDA under an Emergency Use Authorization (EUA). This EUA will remain  in effect (meaning this test can be used) for the duration of the COVID-19 declaration under Section 564(b)(1) of the Act, 21 U.S.C.section 360bbb-3(b)(1), unless the authorization is terminated  or revoked sooner.       Influenza A by PCR NEGATIVE NEGATIVE Final   Influenza B by PCR NEGATIVE NEGATIVE Final    Comment: (NOTE) The Xpert Xpress SARS-CoV-2/FLU/RSV plus assay is intended as an aid in the diagnosis of influenza from Nasopharyngeal swab specimens and should not be used as a sole basis for treatment. Nasal washings and aspirates are unacceptable for Xpert Xpress SARS-CoV-2/FLU/RSV testing.  Fact Sheet for Patients: EntrepreneurPulse.com.au  Fact Sheet for Healthcare  Providers: IncredibleEmployment.be  This test is not yet approved or cleared by the [place] FDA and has been authorized for detection and/or diagnosis of SARS-CoV-2 by FDA under an Emergency Use Authorization (EUA). This EUA will remain in effect (meaning this test can be used) for the duration of the COVID-19 declaration under Section 564(b)(1) of the Act, 21 U.S.C. section 360bbb-3(b)(1), unless the authorization is terminated or revoked.  Performed at [facility], [address] [place], [place] [address]   CULTURE, BLOOD (ROUTINE X 2) w Reflex to ID Panel     Status: None (Preliminary result)   Collection Time: 11/08/20  1:20 PM   Specimen: BLOOD  Result Value Ref Range Status   Specimen Description BLOOD RIGHT ANTECUBITAL  Final   Special Requests   Final    BOTTLES DRAWN AEROBIC AND ANAEROBIC Blood Culture adequate  volume   Culture   Final    NO GROWTH 3 DAYS Performed at [facility], [address] [place], [place] [address]    Report Status PENDING  Incomplete  CULTURE, BLOOD (ROUTINE X 2) w Reflex to ID Panel     Status: None (Preliminary result)   Collection Time: 11/08/20  1:20 PM   Specimen: BLOOD  Result Value Ref Range Status   Specimen Description BLOOD LEFT ANTECUBITAL  Final   Special Requests   Final    BOTTLES DRAWN AEROBIC AND ANAEROBIC Blood Culture adequate volume   Culture   Final    NO GROWTH 3 DAYS Performed at [facility], [address] [place], [place] [address]    Report Status PENDING  Incomplete  MRSA PCR Screening     Status: None   Collection Time: 11/08/20  9:23 PM   Specimen: Nasal Mucosa; Nasopharyngeal  Result Value Ref Range Status   MRSA by PCR NEGATIVE NEGATIVE Final    Comment:        The GeneXpert MRSA Assay (FDA approved for NASAL specimens only), is one component of a comprehensive MRSA colonization surveillance program. It is not intended to diagnose  MRSA infection nor to guide or monitor treatment for MRSA infections. Performed at [facility], [address], [place], [place] [address]          Radiology Studies: CT GUIDED NEEDLE PLACEMENT  Result Date: 11/11/2020 INDICATION: No known primary, now with multiple of rest of osseous lesions worrisome for metastatic disease. Please perform CT-guided biopsy of permeative lesion involving the anterior aspect of the right ilium for tissue diagnostic purposes. Note, patient also found to have malignant-appearing retroperitoneal lymph nodes however given the size and location of the periaortic lymph nodes decision was made to initially proceed with CT-guided bone lesion biopsy in hopes this biopsy will provided tissue diagnosis in this patient with history of schizophrenia and uncertain ability to tolerate and/or cooperate with a CT-guided biopsy. EXAM: CT-GUIDED BIOPSY INVOLVING THE ANTERIOR ASPECT OF THE RIGHT ILIUM. MEDICATIONS: None COMPARISON:  CT the chest, abdomen and pelvis-earlier same day; pelvic MRI-11/09/2020 ANESTHESIA/SEDATION: Fentanyl 50 mcg IV; Versed 1 mg IV Sedation time: 18 minutes; The patient was continuously monitored during the procedure by the interventional radiology nurse under my direct supervision. COMPLICATIONS: None immediate. PROCEDURE: Informed consent was obtained from the patient's family following an explanation of the procedure, risks, benefits and alternatives. The patient understands, agrees and consents for the procedure. All questions were addressed. A time out was performed prior to the initiation of the procedure. The patient was positioned supine on the CT table and a limited CT was performed for procedural planning demonstrating a permeative lesion involving the anterior aspect of the right ilium. The procedure was planned. The operative site was prepped and draped in the usual sterile fashion. Appropriate trajectory was confirmed with a 22 gauge  spinal needle after the adjacent tissues were anesthetized with 1% Lidocaine with epinephrine. Next, an 11 gauge coaxial bone biopsy needle was advanced into anterior aspect of the right ilium. The inner 13 gauge coil axial bone biopsy device was then utilized to acquire the initial sample after appropriate position was confirmed with CT imaging (image 11, series 3). Finally, the outer 11 gauge bone biopsy device was utilized to acquire an additional sample (image 15, series 3). The needle was removed and superficial hemostasis was obtained with manual compression. A dressing was applied. The patient tolerated the procedure well  without immediate post procedural complication. IMPRESSION: Successful CT guided biopsy of permanent lesion involving the anterior aspect of the right ilium. PLAN: As above, CT-guided bone lesion biopsy was initially pursued given patient's history of schizophrenia and uncertain ability to tolerate and/or cooperate with CT-guided biopsy however ultimately if additional tissue is required attempted CT-guided retroperitoneal lymph node biopsy could be performed as indicated. Electronically Signed   By: Sandi Mariscal M.D.   On: 11/11/2020 11:21   CT CHEST ABDOMEN PELVIS W CONTRAST  Result Date: 11/11/2020 CLINICAL DATA:  Gastrointestinal cancer. Osseous metastatic disease. Initial staging examination EXAM: CT CHEST, ABDOMEN, AND PELVIS WITH CONTRAST TECHNIQUE: Multidetector CT imaging of the chest, abdomen and pelvis was performed following the standard protocol during bolus administration of intravenous contrast. CONTRAST:  159mL OMNIPAQUE IOHEXOL 300 MG/ML  SOLN COMPARISON:  Noncontrast CT abdomen pelvis 11/08/2020, FINDINGS: CT CHEST FINDINGS Cardiovascular: Extensive multi-vessel coronary artery calcification. Global cardiac size within normal limits. No pericardial effusion. The central pulmonary arteries are of normal caliber. Mild atherosclerotic calcification within the thoracic  aorta. No aortic aneurysm. Mediastinum/Nodes: There is necrotic appearing left hilar and subcarinal adenopathy. By example, left hilar adenopathy measures 2.3 x 2.5 cm at axial image # 26/2. Subcarinal adenopathy measures 1.5 x 2.9 cm at axial image # 30/2. The esophagus is unremarkable. Lungs/Pleura: Mild centrilobular emphysema. Mild bibasilar atelectasis. No focal pulmonary nodules or infiltrates. No pneumothorax or pleural effusion. Left hilar adenopathy results in circumferential narrowing of the left lower lobar pulmonary bronchi. Musculoskeletal: Pathologic appearing compression fracture of T6 with near vertebral plana deformity. Minimal retropulsion of the posteroinferior component of the T6 vertebral body resulting in minimal central canal stenosis. Mixed lytic and sclerotic pattern within the a T3 vertebral body may reflect the presence of underlying metastatic disease in this location. No definite cortical erosion. No associated pathologic fracture. CT ABDOMEN PELVIS FINDINGS Hepatobiliary: No focal liver abnormality is seen. No gallstones, gallbladder wall thickening, or biliary dilatation. Pancreas: Unremarkable Spleen: Unremarkable Adrenals/Urinary Tract: The adrenal glands are unremarkable. The kidneys are normal in size and position. Multiple cortical hypodensities are seen within the kidneys bilaterally which are too small to characterize. A a 17 mm cortical hypodensity, however, within the posterior interpolar region of the left kidney appears solid in nature and may represent a hypoenhancing mass, but is not optimally characterized on this exam. No hydronephrosis. No intrarenal or ureteral calculi. The bladder is unremarkable. Stomach/Bowel: There is polypoid fold thickening within the a mid body of the stomach, best seen on axial image # 53/2 and sagittal image # 122/6 and an underlying gastric mass is difficult to exclude. The stomach, small bowel, and large bowel are otherwise unremarkable save  for moderate stool seen throughout the colon. No evidence of obstruction or focal inflammation. The appendix is not clearly identified and may be absent. No free intraperitoneal gas or fluid. Vascular/Lymphatic: Extensive aortoiliac atherosclerotic calcification. No aortic aneurysm. There is pathologic retroperitoneal adenopathy within the retrocrural, left periaortic, aortocaval, and retrocaval lymph node groups as well as the common iliac lymph node groups bilaterally. Index lymph node within the left periaortic lymph node group measures 1.9 x 2.0 cm at axial image # 74. Reproductive: Uterus and bilateral adnexa are unremarkable. Other: No abdominal wall hernia.  Rectum unremarkable. Musculoskeletal: Mild sclerosis involving the right ilium and left pubic symphysis. No associated pathologic fracture. With associated periosteal reaction is again identified, better assessed on accompanying MRI examination. The majority of these lesions, however, appear radiographically occult on this  exam IMPRESSION: Pathologic left hilar, subcarinal, retrocrural, and retroperitoneal lymphadenopathy. Left periaortic lymphadenopathy may provide a viable target for CT-guided biopsy for tissue sampling. Polypoid fold thickening involving the mid body of the stomach. An underlying primary gastric mass is difficult to exclude on this examination. This would be better assessed with endoscopy or upper gastrointestinal series. 17 mm hypoenhancing suspected solid mass within the left kidney. Dedicated renal sonography may be helpful, as an initial step, for further evaluation. Multiple osseous lesions in keeping with osseous metastatic disease. The majority of these lesions within the pelvis noted on prior MRI examination are radiographically occult. T6 suspected pathologic fracture. Additional possible metastasis involving the T3 vertebral body. This could be confirmed with contrast enhanced MRI examination. Extensive multi-vessel coronary  artery calcification. Mild centrilobular emphysema. Aortic Atherosclerosis (ICD10-I70.0) and Emphysema (ICD10-J43.9). Electronically Signed   By: Fidela Salisbury MD   On: 11/11/2020 02:30        Scheduled Meds: . acidophilus  1 capsule Oral Daily  . atorvastatin  20 mg Oral QPM  . benztropine  0.5 mg Oral BID  . buPROPion  150 mg Oral BH-q7a  . cholecalciferol  2,000 Units Oral q AM  . clonazePAM  0.5 mg Oral TID  . Difluprednate  1 drop Ophthalmic BID  . [START ON 11/12/2020] enoxaparin (LOVENOX) injection  40 mg Subcutaneous Q24H  . fentaNYL      . lithium carbonate  150 mg Oral BID WC  . loratadine  10 mg Oral Daily  . midazolam      . traZODone  50 mg Oral QHS   Continuous Infusions: . sodium chloride Stopped (11/10/20 1133)     LOS: 3 days    Time spent: 30 minutes    Barb Merino, MD [facility] Pager [phone]

## 2020-11-11 NOTE — TOC Progression Note (Signed)
Transition of Care [facility]) - Progression Note    Patient Details  Name: Danielle Steele MRN: [id number] Date of Birth: 09/11/1940  [facility] CM/SW Contact  Beverly Sessions, RN Phone Number: 11/11/2020, 9:30 AM  Clinical Narrative:     PASRR [phone] A Spoke with patient's brother [name] and notified him that we have a bed offer from [facility]  I have reached out to [facility] and [facility] to ask them to review as these facilities are their preference   Expected Discharge Plan: [place] Barriers to Discharge: Continued Medical Work up  Expected Discharge Plan and Services Expected Discharge Plan: [place] arrangements for the past 2 months: [place]                                       Social Determinants of Health (SDOH) Interventions    Readmission Risk Interventions No flowsheet data found.

## 2020-11-11 NOTE — Consult Note (Addendum)
Hematology/Oncology Consult note [facility] Telephone:([phone] [phone]  Patient Care Team: Ann Held as PCP - General (Physician Assistant)   Name of the patient: Danielle Steele  [id number]  [birth year]    Reason for consult: Bone metastases and pathologic adenopathy   Requesting physician: Dr. Sloan Leiter  Date of visit: 11/11/2020    History of presenting illness-patient is a 80 year old female with a past medical history significant for schizophrenia, bipolar disorder, hypertension hyperlipidemia and depression who lives in an assisted facility.  She was brought to the hospital with symptoms of altered mental status.  At baseline despite her mental issues patient has been functional and communicating with the family so this has been a change in her mental status.  There has been progressive decline in her performance status.  Patient underwent CT chest abdomen and pelvis with contrast which showed necrotic appearing left hilar and subcarinal adenopathy measuring 2.3 x 2.5 cm and 1.5 x 2.9 cm respectively.  Left hilar adenopathy results in circumferential narrowing of the left lower lobe pulmonary bronchi.  No lung nodules or infiltrates.  Compression fracture of T6 which appears pathologic.  Mixed lytic and sclerotic pattern within T3 vertebral body.  Spleen, pancreas, liver are unremarkable.  Polypoid fold thickening in the mid body of the stomach.  Pathologic retroperitoneal adenopathy with index left periaortic lymph node measuring 1.9 x 2 cm.  1.7 cm hypoenhancing suspected solid mass within the left kidney.  CT head showed no acute abnormality.  Labs notable for leukocytosis with white count of 24 with absolute neutrophilia and some monocytosis and eosinophilia.  CMP essentially unremarkable except for mildly low albumin of 2.8.  Patient was sleeping soundly at the time of my visit today. She could not be arouse to provide history  despite repeat attempts     Review of systems- Review of Systems  Unable to perform ROS: Other    Allergies  Allergen Reactions  . Citrus Rash    Patient Active Problem List   Diagnosis Date Noted  . Abdominal pain 11/08/2020  . UTI (urinary tract infection) 11/08/2020  . Sepsis ([place]) 11/08/2020  . Acute metabolic encephalopathy 58/04/9832  . Schizophrenia ([place])   . Bipolar disorder ([place])   . Depression   . Anxiety   . Hypertension   . HLD (hyperlipidemia)   . AKI (acute kidney injury) ([place])   . Abnormal CT scan      Past Medical History:  Diagnosis Date  . Anxiety   . Bipolar disorder ([place])   . Depression   . GERD (gastroesophageal reflux disease)   . Hypertension   . PND (post-nasal drip)    CAUSES SOME COUGH  . Pre-diabetes   . Schizophrenia ([place])    SCHIZO  AFFECTIVE DISORDER  . Thyroid nodule   . Tremors of nervous system      Past Surgical History:  Procedure Laterality Date  . CATARACT EXTRACTION W/PHACO Left 09/07/2017   Procedure: CATARACT EXTRACTION PHACO AND INTRAOCULAR LENS PLACEMENT (IOC);  Surgeon: Birder Robson, MD;  Location: [facility] ORS;  Service: Ophthalmology;  Laterality: Left;  [place] 00:39.9 AP% 17.4 CDE 6.94 Fluid Pack lot # [id number]  . CATARACT EXTRACTION W/PHACO Right 09/28/2017   Procedure: CATARACT EXTRACTION PHACO AND INTRAOCULAR LENS PLACEMENT (IOC);  Surgeon: Birder Robson, MD;  Location: [facility] ORS;  Service: Ophthalmology;  Laterality: Right;  [place] 01:31.8 AP% 13.7 CDE 12.55 Fluid Pack Lot # [id number]   . EYE SURGERY    . TONSILLECTOMY  Social History   Socioeconomic History  . Marital status: Married    Spouse name: Not on file  . Number of children: Not on file  . Years of education: Not on file  . Highest education level: Not on file  Occupational History  . Not on file  Tobacco Use  . Smoking status: Former Research scientist (life sciences)  . Smokeless tobacco: Never Used  Vaping Use  . Vaping Use: Never used  Substance and Sexual  Activity  . Alcohol use: No  . Drug use: No  . Sexual activity: Not on file  Other Topics Concern  . Not on file  Social History Narrative  . Not on file   Social Determinants of Health   Financial Resource Strain: Not on file  Food Insecurity: Not on file  Transportation Needs: Not on file  Physical Activity: Not on file  Stress: Not on file  Social Connections: Not on file  Intimate Partner Violence: Not on file     History reviewed. No pertinent family history.   Current Facility-Administered Medications:  .  0.9 %  sodium chloride infusion, , Intravenous, PRN, Barb Merino, MD, Stopped at 11/10/20 1133 .  acetaminophen (TYLENOL) tablet 650 mg, 650 mg, Oral, Q6H PRN, Ivor Costa, MD, 650 mg at 11/11/20 1441 .  acidophilus (RISAQUAD) capsule 1 capsule, 1 capsule, Oral, Daily, Barb Merino, MD, 1 capsule at 11/11/20 1045 .  atorvastatin (LIPITOR) tablet 20 mg, 20 mg, Oral, QPM, Ivor Costa, MD, 20 mg at 11/10/20 1635 .  benztropine (COGENTIN) tablet 0.5 mg, 0.5 mg, Oral, BID, Ivor Costa, MD, 0.5 mg at 11/11/20 1125 .  buPROPion (WELLBUTRIN XL) 24 hr tablet 150 mg, 150 mg, Oral, Gaye Alken, MD, 150 mg at 11/11/20 1043 .  cholecalciferol (VITAMIN D) tablet 2,000 Units, 2,000 Units, Oral, q AM, Ivor Costa, MD, 2,000 Units at 11/11/20 1044 .  clonazePAM (KLONOPIN) tablet 0.5 mg, 0.5 mg, Oral, TID, Ivor Costa, MD, 0.5 mg at 11/11/20 1441 .  Difluprednate 0.05 % EMUL 1 drop, 1 drop, Ophthalmic, BID, Ivor Costa, MD .  Derrill Memo ON 11/12/2020] enoxaparin (LOVENOX) injection 40 mg, 40 mg, Subcutaneous, Q24H, Shanlever, Pierce Crane, RPH .  fentaNYL (SUBLIMAZE) 100 MCG/2ML injection, , , ,  .  guaiFENesin (ROBITUSSIN) 100 MG/5ML solution 300 mg, 300 mg, Oral, Q6H PRN, Ivor Costa, MD .  lithium carbonate capsule 150 mg, 150 mg, Oral, BID WC, Ivor Costa, MD, 150 mg at 11/11/20 1125 .  loperamide (IMODIUM) capsule 4 mg, 4 mg, Oral, PRN, Ivor Costa, MD .  loratadine (CLARITIN) tablet 10  mg, 10 mg, Oral, Daily, Ivor Costa, MD, 10 mg at 11/11/20 1045 .  magnesium citrate solution 1 Bottle, 1 Bottle, Oral, Once, Ghimire, Kuber, MD .  magnesium hydroxide (MILK OF MAGNESIA) suspension 30 mL, 30 mL, Oral, Daily PRN, Ivor Costa, MD, 30 mL at 11/11/20 1125 .  midazolam (VERSED) 5 MG/5ML injection, , , ,  .  morphine 2 MG/ML injection 0.5 mg, 0.5 mg, Intravenous, Q4H PRN, Ivor Costa, MD .  ondansetron (ZOFRAN) injection 4 mg, 4 mg, Intravenous, Q8H PRN, Ivor Costa, MD .  traZODone (DESYREL) tablet 50 mg, 50 mg, Oral, QHS, Ivor Costa, MD, 50 mg at 11/10/20 2120   Physical exam:  Vitals:   11/11/20 1028 11/11/20 1100 11/11/20 1329 11/11/20 1517  BP: 136/66 129/61 123/61 117/60  Pulse: 79 75 85 72  Resp: 20  20   Temp: (!) 97.3 F (36.3 C)  97.7 F (36.5 C) 97.8 F (  36.6 C)  TempSrc: Oral  Oral   SpO2: 99% (!) 89% 96% 100%  Weight:      Height:       Physical Exam Constitutional:      Comments: Patient sleeping soundly and unable to be aroused. [place] over b/l hands  Cardiovascular:     Rate and Rhythm: Normal rate and regular rhythm.     Heart sounds: Normal heart sounds.  Pulmonary:     Effort: Pulmonary effort is normal.     Breath sounds: Normal breath sounds.  Abdominal:     General: Bowel sounds are normal.     Palpations: Abdomen is soft.  Skin:    General: Skin is warm and dry.        CMP Latest Ref Rng & Units 11/10/2020  Glucose 70 - 99 mg/dL 100(H)  BUN 8 - 23 mg/dL 14  Creatinine 0.44 - 1.00 mg/dL 0.96  Sodium 135 - 145 mmol/L 140  Potassium 3.5 - 5.1 mmol/L 3.7  Chloride 98 - 111 mmol/L 112(H)  CO2 22 - 32 mmol/L 21(L)  Calcium 8.9 - 10.3 mg/dL 9.8  Total Protein 6.5 - 8.1 g/dL 6.2(L)  Total Bilirubin 0.3 - 1.2 mg/dL 0.4  Alkaline Phos 38 - 126 U/L 93  AST 15 - 41 U/L 16  ALT 0 - 44 U/L 11   CBC Latest Ref Rng & Units 11/11/2020  WBC 4.0 - 10.5 K/uL 24.1(H)  Hemoglobin 12.0 - 15.0 g/dL 11.8(L)  Hematocrit 36.0 - 46.0 % 35.3(L)  Platelets  150 - 400 K/uL 318    @IMAGES @  CT ABDOMEN PELVIS WO CONTRAST  Result Date: 11/08/2020 CLINICAL DATA:  Abdominal pain and fever EXAM: CT ABDOMEN AND PELVIS WITHOUT CONTRAST TECHNIQUE: Multidetector CT imaging of the abdomen and pelvis was performed following the standard protocol without oral or IV contrast. COMPARISON:  None. FINDINGS: Lower chest: There is bibasilar atelectatic change. No consolidation in the lung bases. There are foci of coronary artery calcification. Hepatobiliary: No focal liver lesions are appreciable. The gallbladder appears distended. Gallbladder wall does not appear appreciably thickened by CT. No biliary duct dilatation evident. Pancreas: There is no pancreatic mass or inflammatory focus. Spleen: No splenic lesions are evident. Adrenals/Urinary Tract: There is mild adrenal hypertrophy bilaterally. No focal adrenal lesions evident. There is no appreciable renal mass or hydronephrosis on either side. There is no evident renal or ureteral calculus on either side. The urinary bladder appears distended without wall thickening. Stomach/Bowel: There is no appreciable bowel wall or mesenteric thickening. There is moderate stool and air in the colon. There is no evident bowel obstruction. Terminal ileum appears unremarkable. Appendix appears normal. No evident free air or portal venous air. Vascular/Lymphatic: There is no abdominal aortic aneurysm. There is extensive aortic and iliac artery atherosclerotic calcification. No adenopathy is evident in the abdomen or pelvis. Reproductive: Uterus is mildly canted to the left. No adnexal masses are evident. Other: No evident abscess or ascites in the abdomen or pelvis. Musculoskeletal: Relative sclerosis noted in the right iliac bone in a portion of the right acetabulum with mildly irregular periosteum, likely due to Paget's disease. No lytic or destructive lesions are evident. There is degenerative change in each pubic symphysis. No intramuscular  lesions evident. IMPRESSION: 1. 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 Note that there is mildly irregular periosteum in this area. An atypical bony neoplasm could potentially present in this manner. Given this appearance of the periosteum in this area, correlation  with MR of the right iliac crest and acetabulum is advised to further evaluate. 2. Gallbladder is mildly distended without overt wall thickening by CT. No biliary duct dilatation. Correlation with ultrasound of the gallbladder may be advisable in this circumstance. 3. Urinary bladder distended without wall thickening. No renal or ureteral calculus. No hydronephrosis on either side. 4. No evident bowel obstruction. No abscess in the abdomen or pelvis. Appendix region appears normal. 5. Aortic Atherosclerosis (ICD10-I70.0). Foci of coronary artery and iliac artery atherosclerotic calcification noted. 6. Mild adrenal hypertrophy bilaterally without focal adrenal lesion. Significance of mild adrenal hypertrophy is uncertain in this age group. Electronically Signed   By: Lowella Grip III M.D.   On: 11/08/2020 10:30   CT HEAD WO CONTRAST  Result Date: 11/08/2020 CLINICAL DATA:  Mental status change EXAM: CT HEAD WITHOUT CONTRAST TECHNIQUE: Contiguous axial images were obtained from the base of the skull through the vertex without intravenous contrast. COMPARISON:  October 10, 2020 FINDINGS: Brain: No evidence of acute territorial infarction, hemorrhage, hydrocephalus,extra-axial collection or mass lesion/mass effect. There is dilatation the ventricles and sulci consistent with age-related atrophy. Low-attenuation changes in the deep white matter consistent with small vessel ischemia. Vascular: No hyperdense vessel or unexpected calcification. Skull: The skull is intact. No fracture or focal lesion identified. Sinuses/Orbits: The visualized paranasal sinuses and mastoid air cells are clear. The orbits and globes intact.  Other: None IMPRESSION: No acute intracranial abnormality. Findings consistent with age related atrophy and chronic small vessel ischemia Electronically Signed   By: Prudencio Pair M.D.   On: 11/08/2020 15:58   CT GUIDED NEEDLE PLACEMENT  Result Date: 11/11/2020 INDICATION: No known primary, now with multiple of rest of osseous lesions worrisome for metastatic disease. Please perform CT-guided biopsy of permeative lesion involving the anterior aspect of the right ilium for tissue diagnostic purposes. Note, patient also found to have malignant-appearing retroperitoneal lymph nodes however given the size and location of the periaortic lymph nodes decision was made to initially proceed with CT-guided bone lesion biopsy in hopes this biopsy will provided tissue diagnosis in this patient with history of schizophrenia and uncertain ability to tolerate and/or cooperate with a CT-guided biopsy. EXAM: CT-GUIDED BIOPSY INVOLVING THE ANTERIOR ASPECT OF THE RIGHT ILIUM. MEDICATIONS: None COMPARISON:  CT the chest, abdomen and pelvis-earlier same day; pelvic MRI-11/09/2020 ANESTHESIA/SEDATION: Fentanyl 50 mcg IV; Versed 1 mg IV Sedation time: 18 minutes; The patient was continuously monitored during the procedure by the interventional radiology nurse under my direct supervision. COMPLICATIONS: None immediate. PROCEDURE: Informed consent was obtained from the patient's family following an explanation of the procedure, risks, benefits and alternatives. The patient understands, agrees and consents for the procedure. All questions were addressed. A time out was performed prior to the initiation of the procedure. The patient was positioned supine on the CT table and a limited CT was performed for procedural planning demonstrating a permeative lesion involving the anterior aspect of the right ilium. The procedure was planned. The operative site was prepped and draped in the usual sterile fashion. Appropriate trajectory was confirmed  with a 22 gauge spinal needle after the adjacent tissues were anesthetized with 1% Lidocaine with epinephrine. Next, an 11 gauge coaxial bone biopsy needle was advanced into anterior aspect of the right ilium. The inner 13 gauge coil axial bone biopsy device was then utilized to acquire the initial sample after appropriate position was confirmed with CT imaging (image 11, series 3). Finally, the outer 11 gauge bone biopsy device was  utilized to acquire an additional sample (image 15, series 3). The needle was removed and superficial hemostasis was obtained with manual compression. A dressing was applied. The patient tolerated the procedure well without immediate post procedural complication. IMPRESSION: Successful CT guided biopsy of permanent lesion involving the anterior aspect of the right ilium. PLAN: As above, CT-guided bone lesion biopsy was initially pursued given patient's history of schizophrenia and uncertain ability to tolerate and/or cooperate with CT-guided biopsy however ultimately if additional tissue is required attempted CT-guided retroperitoneal lymph node biopsy could be performed as indicated. Electronically Signed   By: Sandi Mariscal M.D.   On: 11/11/2020 11:21   MR PELVIS W WO CONTRAST  Result Date: 11/10/2020 CLINICAL DATA:  Right pelvic bone lesion on CT. EXAM: MRI PELVIS WITHOUT AND WITH CONTRAST TECHNIQUE: Multiplanar multisequence MR imaging of the pelvis was performed both before and after administration of intravenous contrast. CONTRAST:  89mL GADAVIST GADOBUTROL 1 MMOL/ML IV SOLN COMPARISON:  CT abdomen pelvis dated November 08, 2020. FINDINGS: Musculoskeletal: Large marrow replacing lesion within the right iliac bone extending into the acetabulum with associated periosteal reaction and surrounding reactive edema in the gluteus and iliacus muscles. Additional marrow replacing lesions in the left and right sacral ala (series 11, images 13 and 15) and S1 vertebral body (series 11, image 11.  No definite soft tissue mass. No acute fracture or dislocation. Urinary Tract:  Foley catheter within the decompressed bladder. Bowel:  Unremarkable visualized pelvic bowel loops. Vascular/Lymphatic: No pathologically enlarged lymph nodes. No significant vascular abnormality seen. Reproductive:  No mass or other significant abnormality. Other:  None. IMPRESSION: 1. Multiple aggressive appearing marrow replacing lesions in the pelvis, the largest involving the right iliac bone and acetabulum. 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 Electronically Signed   By: Titus Dubin M.D.   On: 11/10/2020 12:59   CT CHEST ABDOMEN PELVIS W CONTRAST  Result Date: 11/11/2020 CLINICAL DATA:  Gastrointestinal cancer. Osseous metastatic disease. Initial staging examination EXAM: CT CHEST, ABDOMEN, AND PELVIS WITH CONTRAST TECHNIQUE: Multidetector CT imaging of the chest, abdomen and pelvis was performed following the standard protocol during bolus administration of intravenous contrast. CONTRAST:  167mL OMNIPAQUE IOHEXOL 300 MG/ML  SOLN COMPARISON:  Noncontrast CT abdomen pelvis 11/08/2020, FINDINGS: CT CHEST FINDINGS Cardiovascular: Extensive multi-vessel coronary artery calcification. Global cardiac size within normal limits. No pericardial effusion. The central pulmonary arteries are of normal caliber. Mild atherosclerotic calcification within the thoracic aorta. No aortic aneurysm. Mediastinum/Nodes: There is necrotic appearing left hilar and subcarinal adenopathy. By example, left hilar adenopathy measures 2.3 x 2.5 cm at axial image # 26/2. Subcarinal adenopathy measures 1.5 x 2.9 cm at axial image # 30/2. The esophagus is unremarkable. Lungs/Pleura: Mild centrilobular emphysema. Mild bibasilar atelectasis. No focal pulmonary nodules or infiltrates. No pneumothorax or pleural effusion. Left hilar adenopathy results in circumferential narrowing of the left lower lobar pulmonary bronchi. Musculoskeletal: Pathologic  appearing compression fracture of T6 with near vertebral plana deformity. Minimal retropulsion of the posteroinferior component of the T6 vertebral body resulting in minimal central canal stenosis. Mixed lytic and sclerotic pattern within the a T3 vertebral body may reflect the presence of underlying metastatic disease in this location. No definite cortical erosion. No associated pathologic fracture. CT ABDOMEN PELVIS FINDINGS Hepatobiliary: No focal liver abnormality is seen. No gallstones, gallbladder wall thickening, or biliary dilatation. Pancreas: Unremarkable Spleen: Unremarkable Adrenals/Urinary Tract: The adrenal glands are unremarkable. The kidneys are normal in size and position. Multiple cortical hypodensities are seen within the  kidneys bilaterally which are too small to characterize. A a 17 mm cortical hypodensity, however, within the posterior interpolar region of the left kidney appears solid in nature and may represent a hypoenhancing mass, but is not optimally characterized on this exam. No hydronephrosis. No intrarenal or ureteral calculi. The bladder is unremarkable. Stomach/Bowel: There is polypoid fold thickening within the a mid body of the stomach, best seen on axial image # 53/2 and sagittal image # 122/6 and an underlying gastric mass is difficult to exclude. The stomach, small bowel, and large bowel are otherwise unremarkable save for moderate stool seen throughout the colon. No evidence of obstruction or focal inflammation. The appendix is not clearly identified and may be absent. No free intraperitoneal gas or fluid. Vascular/Lymphatic: Extensive aortoiliac atherosclerotic calcification. No aortic aneurysm. There is pathologic retroperitoneal adenopathy within the retrocrural, left periaortic, aortocaval, and retrocaval lymph node groups as well as the common iliac lymph node groups bilaterally. Index lymph node within the left periaortic lymph node group measures 1.9 x 2.0 cm at axial  image # 74. Reproductive: Uterus and bilateral adnexa are unremarkable. Other: No abdominal wall hernia.  Rectum unremarkable. Musculoskeletal: Mild sclerosis involving the right ilium and left pubic symphysis. No associated pathologic fracture. With associated periosteal reaction is again identified, better assessed on accompanying MRI examination. The majority of these lesions, however, appear radiographically occult on this exam IMPRESSION: Pathologic left hilar, subcarinal, retrocrural, and retroperitoneal lymphadenopathy. Left periaortic lymphadenopathy may provide a viable target for CT-guided biopsy for tissue sampling. Polypoid fold thickening involving the mid body of the stomach. An underlying primary gastric mass is difficult to exclude on this examination. This would be better assessed with endoscopy or upper gastrointestinal series. 17 mm hypoenhancing suspected solid mass within the left kidney. Dedicated renal sonography may be helpful, as an initial step, for further evaluation. Multiple osseous lesions in keeping with osseous metastatic disease. The majority of these lesions within the pelvis noted on prior MRI examination are radiographically occult. T6 suspected pathologic fracture. Additional possible metastasis involving the T3 vertebral body. This could be confirmed with contrast enhanced MRI examination. Extensive multi-vessel coronary artery calcification. Mild centrilobular emphysema. Aortic Atherosclerosis (ICD10-I70.0) and Emphysema (ICD10-J43.9). Electronically Signed   By: Fidela Salisbury MD   On: 11/11/2020 02:30   DG Chest Port 1 View  Result Date: 11/08/2020 CLINICAL DATA:  Questionable sepsis EXAM: PORTABLE CHEST 1 VIEW COMPARISON:  None. FINDINGS: Mild coarsening of lung markings. No focal pneumonia. No edema, effusion, or pneumothorax. Normal heart size and mediastinal contours. IMPRESSION: 1. No focal pneumonia. 2. Coarsened lung markings which could be chronic lung disease or  atypical infection. Electronically Signed   By: Monte Fantasia M.D.   On: 11/08/2020 09:46   US ABDOMEN LIMITED RUQ (LIVER/GB)  Result Date: 11/08/2020 CLINICAL DATA:  Right upper quadrant pain EXAM: ULTRASOUND ABDOMEN LIMITED RIGHT UPPER QUADRANT COMPARISON:  None. FINDINGS: Gallbladder: No gallstones or wall thickening visualized. No sonographic Murphy sign noted by sonographer. Common bile duct: Diameter: 7.5 mm, upper normal Liver: No focal lesion identified. Within normal limits in parenchymal echogenicity. Portal vein is patent on color Doppler imaging with normal direction of blood flow towards the liver. Other: None. IMPRESSION: Negative for gallstones. Common bile duct upper normal. No liver lesion. Electronically Signed   By: Franchot Gallo M.D.   On: 11/08/2020 12:45    Assessment and plan- Patient is a 80 y.o. female admitted for altered mental status and abdominal pain found to have multistation  lymphadenopathy and multiple bone lesions with no clear primary  I have reviewed CT chest abdomen pelvis images independently.  Based on CT, patient has multiple areas of lymphadenopathy and bone lesions.  Possible primary sites of malignancy could be the 17 mmRenal mass versus possible polypoidal mass seen in the mid body of the stomach versus some other etiology.  Patient has undergone CT-guided biopsy of anterior aspect of the right ilium. Given her underlying altered mental status lymph node biopsy was not attempted today.  For now we will await biopsy results to determine site of primary although NGS testing can sometimes be limited with bone samples.  Acute encephalopathy- per review of notes, this is a shift from her baseline. Ct head showed no acute changes. Recommend MRI brain with and without contrast. If MRI shows no clear etiology recommend having psychiatry on board given h/o underlying schizophrenia and mul;tiple psychoactive drugs on board. Blood cultures negative. Although uA was  posiotive, urine culture was negative.   I will also have palliative care on board to discuss overall goals with the family and establish ACP documents  Leukocytosis: Mainly neutrophilia could be reactive secondary to lithium.  We will check flow cytometry in the morning  Normocytic anemia: Possibly secondary to underlying malignancy.  Will check iron studies B12 and folate in a.m.    Visit Diagnosis 1. AKI (acute kidney injury) ([place])   2. RUQ abdominal pain   3. Hematuria, unspecified type   4. Tumor cells, malignant ([place])     Dr. Randa Evens, MD, MPH [facility] [phone] 11/11/2020  8:57 PM

## 2020-11-11 NOTE — Progress Notes (Signed)
Patient clinically stable post ileum biopsy per Dr Pascal Lux, tolerated well. Received Versed 1 mg along with Fentanyl 50 mcg IV for procedure. Denies complaints post procedure. Vitals stable pre and post procedure. Report given to [place] RN on 2C at bedside post procedure.

## 2020-11-11 NOTE — Consult Note (Signed)
Post IR Procedure Consult - Anticoagulant/Antiplatelet PTA/Inpatient Med List Review by Pharmacist    Question Answer  Bleeding risk associated with procedure Standard   Will restart DVT prophylaxis 4/12 am per protocol  Lu Duffel, PharmD, BCPS Clinical Pharmacist 11/11/2020 11:45 AM

## 2020-11-11 NOTE — Procedures (Signed)
Pre procedural Dx: Aggressive osseous lesions Post procedural Dx: Same  Technically successful CT guided biopsy of aggressive lesion involving the anterior aspect of the right ilium.    EBL: None.  Complications: None immediate.   Ronny Bacon, MD Pager #: [phone]

## 2020-11-11 NOTE — Consult Note (Signed)
Chief Complaint: Bone Lesion. Aggressive Bone Marrow. Request is for bone lesion biopsy  Referring Physician(s): Dr. Raelyn Mora  Supervising Physician: Sandi Mariscal  Patient Status: [facility] - Out-pt  History of Present Illness: Danielle Steele is a 80 y.o. female Former smoker, History of HTN. HLD, schizophrenia, bipolar. Presented to the ED at [facility] with abdominal pain, AMS and Hypotension. 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 Note that there is mildly irregular periosteum in this area. An atypical bony neoplasm could potentially present in this manner. Given this appearance of the periosteum in this area, correlation with MR of the right iliac crest and acetabulum is advised to further evaluate. pelvis from 4.8.22 reads  Multiple aggressive appearing marrow replacing lesions in the pelvis, the largest involving the right iliac bone and acetabulum. 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 Team is requesting a bone marrow biopsy for further evaluation  Past Medical History:  Diagnosis Date  . Anxiety   . Bipolar disorder ([place])   . Depression   . GERD (gastroesophageal reflux disease)   . Hypertension   . PND (post-nasal drip)    CAUSES SOME COUGH  . Pre-diabetes   . Schizophrenia ([place])    SCHIZO  AFFECTIVE DISORDER  . Thyroid nodule   . Tremors of nervous system     Past Surgical History:  Procedure Laterality Date  . CATARACT EXTRACTION W/PHACO Left 09/07/2017   Procedure: CATARACT EXTRACTION PHACO AND INTRAOCULAR LENS PLACEMENT (IOC);  Surgeon: Birder Robson, MD;  Location: [facility] ORS;  Service: Ophthalmology;  Laterality: Left;  [place] 00:39.9 AP% 17.4 CDE 6.94 Fluid Pack lot # [id number]  . CATARACT EXTRACTION W/PHACO Right 09/28/2017   Procedure: CATARACT EXTRACTION PHACO AND INTRAOCULAR LENS PLACEMENT (IOC);  Surgeon: Birder Robson, MD;  Location: [facility] ORS;  Service: Ophthalmology;  Laterality:  Right;  [place] 01:31.8 AP% 13.7 CDE 12.55 Fluid Pack Lot # [id number]   . EYE SURGERY    . TONSILLECTOMY      Allergies: Citrus  Medications: Prior to Admission medications   Medication Sig Start Date End Date Taking? Authorizing Provider  acetaminophen (TYLENOL) 325 MG tablet Take 650 mg by mouth every 4 (four) hours as needed for mild pain or moderate pain.   Yes [provider]  atorvastatin (LIPITOR) 20 MG tablet Take 20 mg by mouth every evening.   Yes [provider]  benztropine (COGENTIN) 0.5 MG tablet Take 0.5 mg by mouth 2 (two) times daily.   Yes [provider]  buPROPion (WELLBUTRIN XL) 150 MG 24 hr tablet Take 150 mg by mouth every morning.   Yes [provider]  cetirizine (ZYRTEC) 10 MG tablet Take 10 mg by mouth daily. 10/28/20  Yes [provider]  Cholecalciferol ([employer] VITAMIN D3) 25 MCG (1000 UT) CHEW Chew 2,000 Units by mouth in the morning.   Yes [provider]  clonazePAM (KLONOPIN) 0.5 MG tablet Take 0.5 mg by mouth 3 (three) times daily.   Yes [provider]  Cranberry 250 MG CAPS Take 500 mg by mouth daily.    Yes [provider]  diclofenac Sodium (VOLTAREN) 1 % GEL Apply topically. 10/04/20  Yes [provider]  fluPHENAZine decanoate (PROLIXIN) 25 MG/ML injection Inject 25 mg into the muscle every 30 (thirty) days.   Yes [provider]  lactobacilus acidophilus & bulgar (FLORANEX) TABS chewable tablet Take 1 tablet by mouth daily. 10/28/20  Yes [provider]  lisinopril (  PRINIVIL,ZESTRIL) 5 MG tablet Take 5 mg by mouth at bedtime.   Yes [provider]  lithium carbonate 150 MG capsule Take 150 mg by mouth 2 (two) times daily with a meal.   Yes [provider]  sulfamethoxazole-trimethoprim (BACTRIM DS) 800-160 MG tablet Take 1 tablet by mouth 2 (two) times daily. 10/31/20  Yes [provider]  traZODone (DESYREL) 50 MG tablet Take 50 mg by  mouth at bedtime.   Yes [provider]  barrier cream (NON-SPECIFIED) CREA Apply 1 application topically as needed (apply topically after toileting as needed to prevent skin breakdown).    [provider]  Camphor-Menthol (MEN-PHOR EX) Apply 1 application topically 2 (two) times daily as needed.    [provider]  camphor-menthol Timoteo Ace) lotion Apply 1 application topically 2 (two) times daily. Apply to back    [provider]  cholecalciferol (VITAMIN D) 1000 units tablet Take 2,000 Units by mouth daily.  Patient not taking: Reported on 11/08/2020    [provider]  Difluprednate (DUREZOL) 0.05 % EMUL Apply 1 drop to eye 2 (two) times daily.    [provider]  guaiFENesin (ROBITUSSIN) 100 MG/5ML liquid Take 300 mg by mouth every 6 (six) hours as needed for cough.     [provider]  ketoconazole (NIZORAL) 2 % cream Apply 1 application topically 2 (two) times daily.    [provider]  lidocaine (LIDODERM) 5 % Place 1 patch onto the skin every 12 (twelve) hours. Remove & Discard patch within 12 hours or as directed by MD Patient not taking: Reported on 11/08/2020 05/30/20 05/30/21  Sable Feil, PA-C  loperamide (IMODIUM A-D) 2 MG tablet Take 4 mg by mouth as needed for diarrhea or loose stools (No more than 8 doses in 24 hours).     [provider]  magnesium hydroxide (MILK OF MAGNESIA) 400 MG/5ML suspension Take 30 mLs by mouth daily as needed for mild constipation.    [provider]  saccharomyces boulardii (FLORASTOR) 250 MG capsule Take 250 mg by mouth daily. Patient not taking: Reported on 11/08/2020    [provider]     History reviewed. No pertinent family history.  Social History   Socioeconomic History  . Marital status: Married    Spouse name: Not on file  . Number of children: Not on file  . Years of education: Not on file  . Highest education level: Not on file   Occupational History  . Not on file  Tobacco Use  . Smoking status: Former Research scientist (life sciences)  . Smokeless tobacco: Never Used  Vaping Use  . Vaping Use: Never used  Substance and Sexual Activity  . Alcohol use: No  . Drug use: No  . Sexual activity: Not on file  Other Topics Concern  . Not on file  Social History Narrative  . Not on file   Social Determinants of Health   Financial Resource Strain: Not on file  Food Insecurity: Not on file  Transportation Needs: Not on file  Physical Activity: Not on file  Stress: Not on file  Social Connections: Not on file     Review of Systems: A 12 point ROS discussed and pertinent positives are indicated in the HPI above.  All other systems are negative.  Review of Systems  Unable to perform ROS: Psychiatric disorder    Vital Signs: BP 126/77 (BP Location: Left Arm)   Pulse 82   Temp 98.9 F (37.2 C) (Oral)  Resp 16   Ht _0  (1.651 m)   Wt 110 lb (49.9 kg)   SpO2 93%   BMI 18.30 kg/m   Physical Exam Vitals and nursing note reviewed.  Constitutional:      Appearance: She is well-developed.  HENT:     Head: Normocephalic and atraumatic.  Eyes:     Conjunctiva/sclera: Conjunctivae normal.  Cardiovascular:     Rate and Rhythm: Normal rate and regular rhythm.  Pulmonary:     Effort: Pulmonary effort is normal.     Breath sounds: Normal breath sounds.  Musculoskeletal:        General: Normal range of motion.     Cervical back: Normal range of motion.  Skin:    General: Skin is warm.  Neurological:     Mental Status: She is alert.     Imaging: CT ABDOMEN PELVIS WO CONTRAST  Result Date: 11/08/2020 CLINICAL DATA:  Abdominal pain and fever EXAM: CT ABDOMEN AND PELVIS WITHOUT CONTRAST TECHNIQUE: Multidetector CT imaging of the abdomen and pelvis was performed following the standard protocol without oral or IV contrast. COMPARISON:  None. FINDINGS: Lower chest: There is bibasilar atelectatic change. No consolidation in the  lung bases. There are foci of coronary artery calcification. Hepatobiliary: No focal liver lesions are appreciable. The gallbladder appears distended. Gallbladder wall does not appear appreciably thickened by CT. No biliary duct dilatation evident. Pancreas: There is no pancreatic mass or inflammatory focus. Spleen: No splenic lesions are evident. Adrenals/Urinary Tract: There is mild adrenal hypertrophy bilaterally. No focal adrenal lesions evident. There is no appreciable renal mass or hydronephrosis on either side. There is no evident renal or ureteral calculus on either side. The urinary bladder appears distended without wall thickening. Stomach/Bowel: There is no appreciable bowel wall or mesenteric thickening. There is moderate stool and air in the colon. There is no evident bowel obstruction. Terminal ileum appears unremarkable. Appendix appears normal. No evident free air or portal venous air. Vascular/Lymphatic: There is no abdominal aortic aneurysm. There is extensive aortic and iliac artery atherosclerotic calcification. No adenopathy is evident in the abdomen or pelvis. Reproductive: Uterus is mildly canted to the left. No adnexal masses are evident. Other: No evident abscess or ascites in the abdomen or pelvis. Musculoskeletal: Relative sclerosis noted in the right iliac bone in a portion of the right acetabulum with mildly irregular periosteum, likely due to Paget's disease. No lytic or destructive lesions are evident. There is degenerative change in each pubic symphysis. No intramuscular lesions evident. IMPRESSION: 1. 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 Note that there is mildly irregular periosteum in this area. An atypical bony neoplasm could potentially present in this manner. Given this appearance of the periosteum in this area, correlation with MR of the right iliac crest and acetabulum is advised to further evaluate. 2. Gallbladder is mildly distended  without overt wall thickening by CT. No biliary duct dilatation. Correlation with ultrasound of the gallbladder may be advisable in this circumstance. 3. Urinary bladder distended without wall thickening. No renal or ureteral calculus. No hydronephrosis on either side. 4. No evident bowel obstruction. No abscess in the abdomen or pelvis. Appendix region appears normal. 5. Aortic Atherosclerosis (ICD10-I70.0). Foci of coronary artery and iliac artery atherosclerotic calcification noted. 6. Mild adrenal hypertrophy bilaterally without focal adrenal lesion. Significance of mild adrenal hypertrophy is uncertain in this age group. Electronically Signed   By: Lowella Grip III M.D.   On: 11/08/2020 10:30   CT  HEAD WO CONTRAST  Result Date: 11/08/2020 CLINICAL DATA:  Mental status change EXAM: CT HEAD WITHOUT CONTRAST TECHNIQUE: Contiguous axial images were obtained from the base of the skull through the vertex without intravenous contrast. COMPARISON:  October 10, 2020 FINDINGS: Brain: No evidence of acute territorial infarction, hemorrhage, hydrocephalus,extra-axial collection or mass lesion/mass effect. There is dilatation the ventricles and sulci consistent with age-related atrophy. Low-attenuation changes in the deep white matter consistent with small vessel ischemia. Vascular: No hyperdense vessel or unexpected calcification. Skull: The skull is intact. No fracture or focal lesion identified. Sinuses/Orbits: The visualized paranasal sinuses and mastoid air cells are clear. The orbits and globes intact. Other: None IMPRESSION: No acute intracranial abnormality. Findings consistent with age related atrophy and chronic small vessel ischemia Electronically Signed   By: Prudencio Pair M.D.   On: 11/08/2020 15:58   MR PELVIS W WO CONTRAST  Result Date: 11/10/2020 CLINICAL DATA:  Right pelvic bone lesion on CT. EXAM: MRI PELVIS WITHOUT AND WITH CONTRAST TECHNIQUE: Multiplanar multisequence MR imaging of the pelvis  was performed both before and after administration of intravenous contrast. CONTRAST:  80m GADAVIST GADOBUTROL 1 MMOL/ML IV SOLN COMPARISON:  CT abdomen pelvis dated November 08, 2020. FINDINGS: Musculoskeletal: Large marrow replacing lesion within the right iliac bone extending into the acetabulum with associated periosteal reaction and surrounding reactive edema in the gluteus and iliacus muscles. Additional marrow replacing lesions in the left and right sacral ala (series 11, images 13 and 15) and S1 vertebral body (series 11, image 11. No definite soft tissue mass. No acute fracture or dislocation. Urinary Tract:  Foley catheter within the decompressed bladder. Bowel:  Unremarkable visualized pelvic bowel loops. Vascular/Lymphatic: No pathologically enlarged lymph nodes. No significant vascular abnormality seen. Reproductive:  No mass or other significant abnormality. Other:  None. IMPRESSION: 1. Multiple aggressive appearing marrow replacing lesions in the pelvis, the largest involving the right iliac bone and acetabulum. 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 Electronically Signed   By: WTitus DubinM.D.   On: 11/10/2020 12:59   CT CHEST ABDOMEN PELVIS W CONTRAST  Result Date: 11/11/2020 CLINICAL DATA:  Gastrointestinal cancer. Osseous metastatic disease. Initial staging examination EXAM: CT CHEST, ABDOMEN, AND PELVIS WITH CONTRAST TECHNIQUE: Multidetector CT imaging of the chest, abdomen and pelvis was performed following the standard protocol during bolus administration of intravenous contrast. CONTRAST:  1036mOMNIPAQUE IOHEXOL 300 MG/ML  SOLN COMPARISON:  Noncontrast CT abdomen pelvis 11/08/2020, FINDINGS: CT CHEST FINDINGS Cardiovascular: Extensive multi-vessel coronary artery calcification. Global cardiac size within normal limits. No pericardial effusion. The central pulmonary arteries are of normal caliber. Mild atherosclerotic calcification within the thoracic aorta. No aortic aneurysm.  Mediastinum/Nodes: There is necrotic appearing left hilar and subcarinal adenopathy. By example, left hilar adenopathy measures 2.3 x 2.5 cm at axial image # 26/2. Subcarinal adenopathy measures 1.5 x 2.9 cm at axial image # 30/2. The esophagus is unremarkable. Lungs/Pleura: Mild centrilobular emphysema. Mild bibasilar atelectasis. No focal pulmonary nodules or infiltrates. No pneumothorax or pleural effusion. Left hilar adenopathy results in circumferential narrowing of the left lower lobar pulmonary bronchi. Musculoskeletal: Pathologic appearing compression fracture of T6 with near vertebral plana deformity. Minimal retropulsion of the posteroinferior component of the T6 vertebral body resulting in minimal central canal stenosis. Mixed lytic and sclerotic pattern within the a T3 vertebral body may reflect the presence of underlying metastatic disease in this location. No definite cortical erosion. No associated pathologic fracture. CT ABDOMEN PELVIS FINDINGS Hepatobiliary: No focal liver abnormality is  seen. No gallstones, gallbladder wall thickening, or biliary dilatation. Pancreas: Unremarkable Spleen: Unremarkable Adrenals/Urinary Tract: The adrenal glands are unremarkable. The kidneys are normal in size and position. Multiple cortical hypodensities are seen within the kidneys bilaterally which are too small to characterize. A a 17 mm cortical hypodensity, however, within the posterior interpolar region of the left kidney appears solid in nature and may represent a hypoenhancing mass, but is not optimally characterized on this exam. No hydronephrosis. No intrarenal or ureteral calculi. The bladder is unremarkable. Stomach/Bowel: There is polypoid fold thickening within the a mid body of the stomach, best seen on axial image # 53/2 and sagittal image # 122/6 and an underlying gastric mass is difficult to exclude. The stomach, small bowel, and large bowel are otherwise unremarkable save for moderate stool seen  throughout the colon. No evidence of obstruction or focal inflammation. The appendix is not clearly identified and may be absent. No free intraperitoneal gas or fluid. Vascular/Lymphatic: Extensive aortoiliac atherosclerotic calcification. No aortic aneurysm. There is pathologic retroperitoneal adenopathy within the retrocrural, left periaortic, aortocaval, and retrocaval lymph node groups as well as the common iliac lymph node groups bilaterally. Index lymph node within the left periaortic lymph node group measures 1.9 x 2.0 cm at axial image # 74. Reproductive: Uterus and bilateral adnexa are unremarkable. Other: No abdominal wall hernia.  Rectum unremarkable. Musculoskeletal: Mild sclerosis involving the right ilium and left pubic symphysis. No associated pathologic fracture. With associated periosteal reaction is again identified, better assessed on accompanying MRI examination. The majority of these lesions, however, appear radiographically occult on this exam IMPRESSION: Pathologic left hilar, subcarinal, retrocrural, and retroperitoneal lymphadenopathy. Left periaortic lymphadenopathy may provide a viable target for CT-guided biopsy for tissue sampling. Polypoid fold thickening involving the mid body of the stomach. An underlying primary gastric mass is difficult to exclude on this examination. This would be better assessed with endoscopy or upper gastrointestinal series. 17 mm hypoenhancing suspected solid mass within the left kidney. Dedicated renal sonography may be helpful, as an initial step, for further evaluation. Multiple osseous lesions in keeping with osseous metastatic disease. The majority of these lesions within the pelvis noted on prior MRI examination are radiographically occult. T6 suspected pathologic fracture. Additional possible metastasis involving the T3 vertebral body. This could be confirmed with contrast enhanced MRI examination. Extensive multi-vessel coronary artery calcification.  Mild centrilobular emphysema. Aortic Atherosclerosis (ICD10-I70.0) and Emphysema (ICD10-J43.9). Electronically Signed   By: Fidela Salisbury MD   On: 11/11/2020 02:30   DG Chest Port 1 View  Result Date: 11/08/2020 CLINICAL DATA:  Questionable sepsis EXAM: PORTABLE CHEST 1 VIEW COMPARISON:  None. FINDINGS: Mild coarsening of lung markings. No focal pneumonia. No edema, effusion, or pneumothorax. Normal heart size and mediastinal contours. IMPRESSION: 1. No focal pneumonia. 2. Coarsened lung markings which could be chronic lung disease or atypical infection. Electronically Signed   By: Monte Fantasia M.D.   On: 11/08/2020 09:46   US ABDOMEN LIMITED RUQ (LIVER/GB)  Result Date: 11/08/2020 CLINICAL DATA:  Right upper quadrant pain EXAM: ULTRASOUND ABDOMEN LIMITED RIGHT UPPER QUADRANT COMPARISON:  None. FINDINGS: Gallbladder: No gallstones or wall thickening visualized. No sonographic Murphy sign noted by sonographer. Common bile duct: Diameter: 7.5 mm, upper normal Liver: No focal lesion identified. Within normal limits in parenchymal echogenicity. Portal vein is patent on color Doppler imaging with normal direction of blood flow towards the liver. Other: None. IMPRESSION: Negative for gallstones. Common bile duct upper normal. No liver lesion. Electronically Signed  By: Franchot Gallo M.D.   On: 11/08/2020 12:45    Labs:  CBC: Recent Labs    10/10/20 0045 11/08/20 0910 11/09/20 0601 11/10/20 0422  WBC 14.6* 24.1* 21.3* 21.4*  HGB 10.8* 10.3* 9.8* 10.3*  HCT 33.8* 31.8* 30.8* 32.5*  PLT 331 291 250 304    COAGS: Recent Labs    11/08/20 0910  INR 1.2  APTT 32    BMP: Recent Labs    10/10/20 0045 11/08/20 0910 11/09/20 0601 11/10/20 0422  NA 136 132* 138 140  K 3.7 4.2 4.7 3.7  CL 104 103 113* 112*  CO2 24 21* 20* 21*  GLUCOSE 129* 108* 126* 100*  BUN 10 25* 13 14  CALCIUM 9.6 9.5 9.2 9.8  CREATININE 0.92 1.57* 1.04* 0.96  GFRNONAA >60 34* 55* >60    LIVER FUNCTION  TESTS: Recent Labs    10/10/20 0045 11/08/20 0910 11/10/20 0422  BILITOT 0.6 0.6 0.4  AST 11* 18 16  ALT _0 ALKPHOS 84 97 93  PROT 7.0 6.2* 6.2*  ALBUMIN 3.1* 2.7* 2.8*     Assessment and Plan:  80 y.o. female inpatient. Former smoker, History of HTN. HLD, schizophrenia, bipolar. Presented to the ED at [facility] with abdominal pain, AMS and Hypotension. 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 Note that there is mildly irregular periosteum in this area. An atypical bony neoplasm could potentially present in this manner. Given this appearance of the periosteum in this area, correlation with MR of the right iliac crest and acetabulum is advised to further evaluate. MRI pelvis from 4.8.22 reads  Multiple aggressive appearing marrow replacing lesions in the pelvis, the largest involving the right iliac bone and acetabulum. 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 Team is requesting a bone marrow biopsy for further evaluation.   WBC is 21.4, Albumin 2.8. Blood cultures negative X 3 days. All other labs are within acceptable parameters. Patient is on prophylactic subcutaneous dose of lovenox. Last dose given on 4.10.22 @ 21:20. Brother is HCPOA.  Risks and benefits of bone marrow - bone lesion biopsy was discussed with the patient's brother y including, but not limited to bleeding, infection, damage to adjacent structures or low yield requiring additional tests.  All of the questions were answered and there is agreement to proceed.  Consent signed and in chart.    Thank you for this interesting consult.  I greatly enjoyed meeting Danielle Steele and look forward to participating in their care.  A copy of this report was sent to the requesting provider on this date.  Electronically Signed: Jacqualine Mau, NP 11/11/2020, 8:28 AM   I spent a total of 40 Minutes    in face to face in clinical consultation,  greater than 50% of which was counseling/coordinating care for bone marrow - bone lesion biopsy

## 2020-11-11 NOTE — Progress Notes (Signed)
Covid swab done for testing and sent down several hours ago.Marland KitchenMarland Kitchen

## 2020-11-11 NOTE — Care Management Important Message (Signed)
Important Message  Patient Details  Name: Danielle Steele MRN: [id number] Date of Birth: 1940/10/04   Medicare Important Message Given:  Yes     [name] 11/11/2020, 11:06 AM

## 2020-11-11 NOTE — TOC Progression Note (Addendum)
Transition of Care [facility]) - Progression Note    Patient Details  Name: Lillion Elbert MRN: [id number] Date of Birth: 20-Feb-1941  Transition of Care [facility]) CM/SW Contact  Beverly Sessions, RN Phone Number: 11/11/2020, 1:24 PM  Clinical Narrative:    [facility] unable to offer Presented bed offers to brother.  He is going to review and call me back directly with his decision  Received return call from brother [name].  Accepted bed at [facility].  Accepted bed in [place], and notified Tanya at [facility]     Expected Discharge Plan: [place] Barriers to Discharge: Continued Medical Work up  Expected Discharge Plan and Services Expected Discharge Plan: [place] arrangements for the past 2 months: [place]                                       Social Determinants of Health (SDOH) Interventions    Readmission Risk Interventions No flowsheet data found.

## 2020-11-12 ENCOUNTER — Inpatient Hospital Stay: Payer: Medicare Other

## 2020-11-12 DIAGNOSIS — Z7189 Other specified counseling: Secondary | ICD-10-CM

## 2020-11-12 DIAGNOSIS — N179 Acute kidney failure, unspecified: Secondary | ICD-10-CM | POA: Diagnosis not present

## 2020-11-12 DIAGNOSIS — R1011 Right upper quadrant pain: Secondary | ICD-10-CM | POA: Diagnosis not present

## 2020-11-12 DIAGNOSIS — C7931 Secondary malignant neoplasm of brain: Secondary | ICD-10-CM

## 2020-11-12 DIAGNOSIS — R9389 Abnormal findings on diagnostic imaging of other specified body structures: Secondary | ICD-10-CM | POA: Diagnosis not present

## 2020-11-12 DIAGNOSIS — F419 Anxiety disorder, unspecified: Secondary | ICD-10-CM

## 2020-11-12 DIAGNOSIS — Z515 Encounter for palliative care: Secondary | ICD-10-CM | POA: Diagnosis not present

## 2020-11-12 DIAGNOSIS — C7951 Secondary malignant neoplasm of bone: Secondary | ICD-10-CM | POA: Diagnosis not present

## 2020-11-12 DIAGNOSIS — G9341 Metabolic encephalopathy: Secondary | ICD-10-CM | POA: Diagnosis not present

## 2020-11-12 LAB — IRON AND TIBC
Iron: 30 ug/dL (ref 28–170)
Saturation Ratios: 17 % (ref 10.4–31.8)
TIBC: 175 ug/dL — ABNORMAL LOW (ref 250–450)
UIBC: 145 ug/dL

## 2020-11-12 LAB — FERRITIN: Ferritin: 145 ng/mL (ref 11–307)

## 2020-11-12 LAB — SARS CORONAVIRUS 2 (TAT 6-24 HRS): SARS Coronavirus 2: NEGATIVE

## 2020-11-12 LAB — VITAMIN B12: Vitamin B-12: 205 pg/mL (ref 180–914)

## 2020-11-12 LAB — FOLATE: Folate: 3.2 ng/mL — ABNORMAL LOW (ref >5.9)

## 2020-11-12 IMAGING — MR MR HEAD WO/W CM
11 series · 48 of 48 positions shown · IV contrast (gadavist)
Comparison: Head CT [DATE]

CLINICAL DATA: Altered mental status. Recently discovered
metastatic disease.

EXAM:
MRI HEAD WITHOUT AND WITH CONTRAST
TECHNIQUE: Multiplanar, multiecho pulse sequences of the brain and surrounding
structures were obtained without and with intravenous contrast.
CONTRAST:  5mL GADAVIST GADOBUTROL 1 MMOL/ML IV SOLN

[Series 5: ax dwi_tracew · axial · 3.0mm · 0.65mm/px · z∈[-85,+67]mm · 4 of 48 slices shown]
[im 1/48]
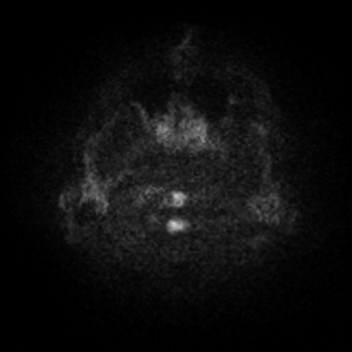
[im 16/48]
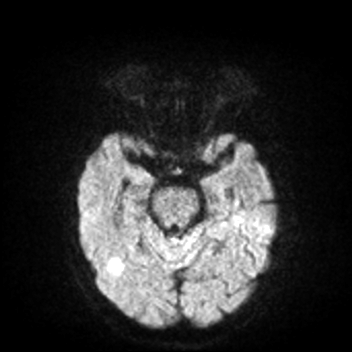
[im 32/48]
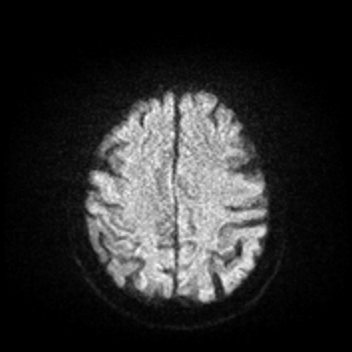
[im 48/48]
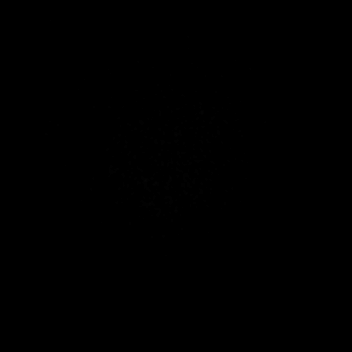

[Series 6: ax dwi_adc · axial · 3.0mm · 0.65mm/px · z∈[-85,+54]mm · 4 of 44 slices shown]
[im 1/44]
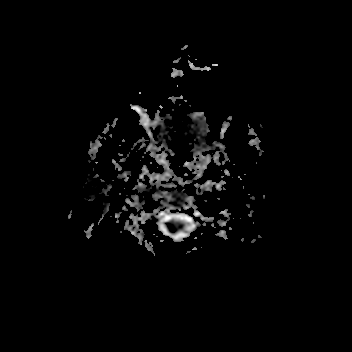
[im 15/44]
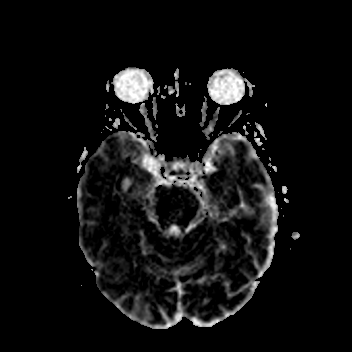
[im 29/44]
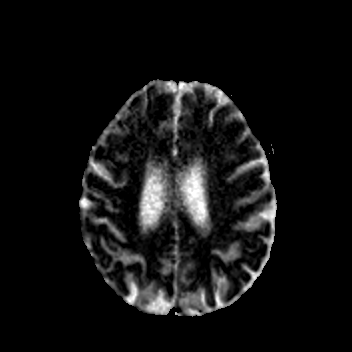
[im 44/44]
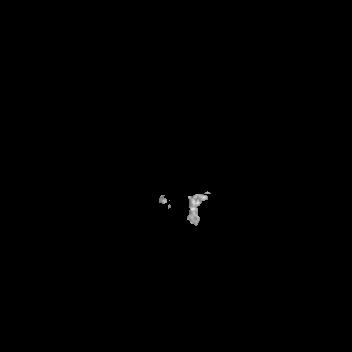

[Series 7: T1 · sagittal · 5.0mm · 0.62mm/px · 2 of 21 slices shown (1 of 2)]
[im 1/21]
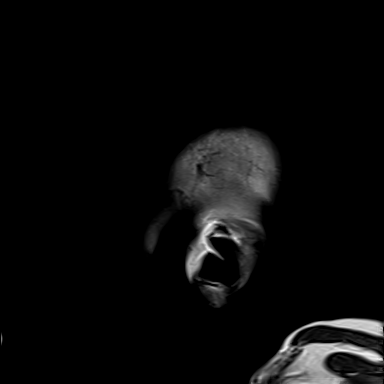
[im 21/21]
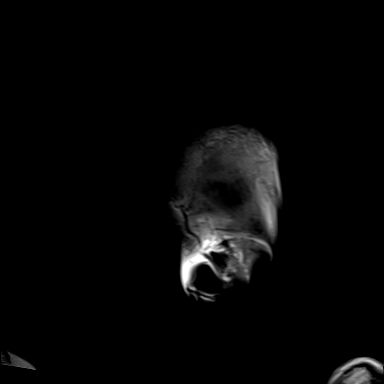

[Series 8: cor dwi_tracew · coronal · 5.0mm · 1.31mm/px · 3 of 38 slices shown]
[im 1/38]
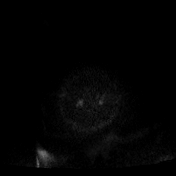
[im 19/38]
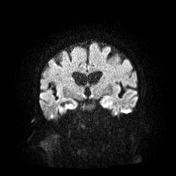
[im 38/38]
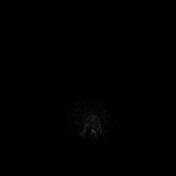

[Series 9: cor dwi_adc · coronal · 5.0mm · 1.31mm/px · 3 of 38 slices shown]
[im 1/38]
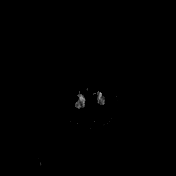
[im 19/38]
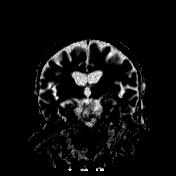
[im 38/38]
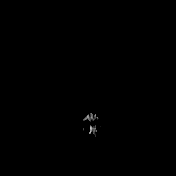

[Series 12: pha_images · axial · 3.0mm · 0.90mm/px · z∈[-94,+77]mm · 5 of 55 slices shown]
[im 1/55]
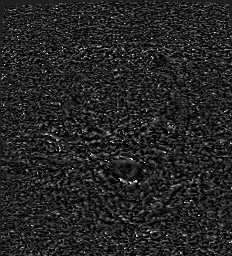
[im 14/55]
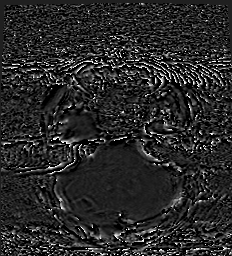
[im 28/55]
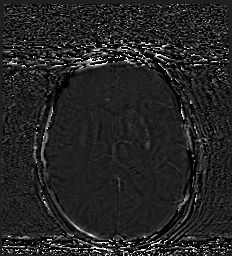
[im 41/55]
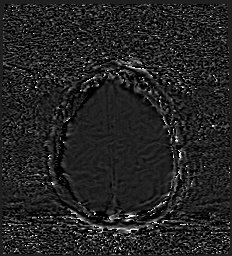
[im 55/55]
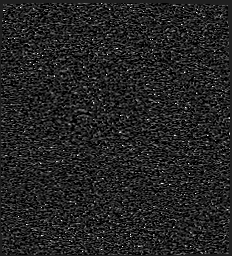

[Series 13: swi_images · axial · 3.0mm · 0.90mm/px · z∈[-97,+77]mm · 5 of 60 slices shown]
[im 1/60]
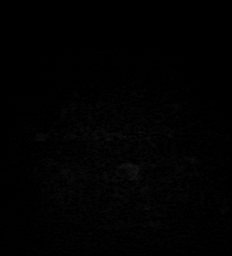
[im 15/60]
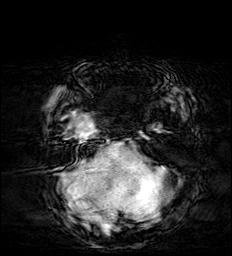
[im 30/60]
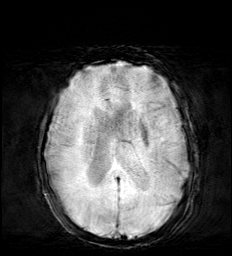
[im 45/60]
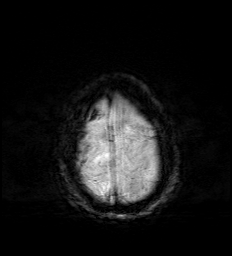
[im 60/60]
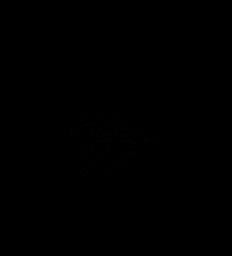

[Series 16: T1 · axial · 1.0mm · 0.98mm/px · z∈[-94,+78]mm · 15 of 176 slices shown (2 of 2)]
[im 1/176]
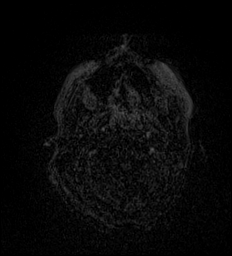
[im 13/176]
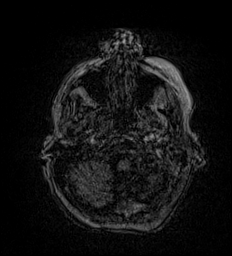
[im 26/176]
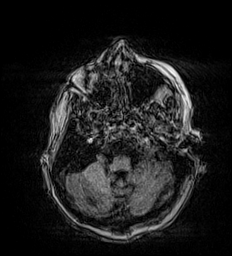
[im 38/176]
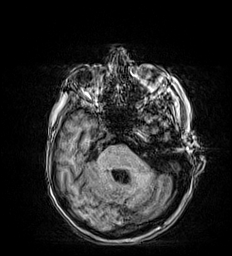
[im 51/176]
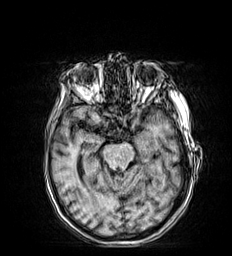
[im 63/176]
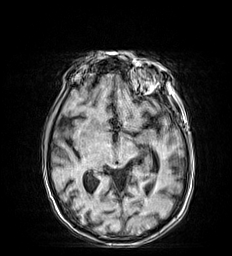
[im 76/176]
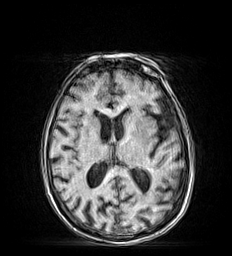
[im 88/176]
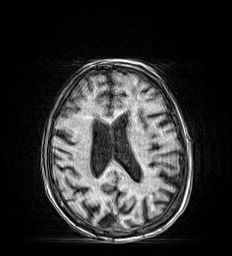
[im 101/176]
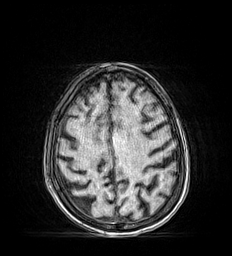
[im 113/176]
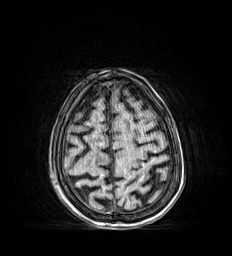
[im 126/176]
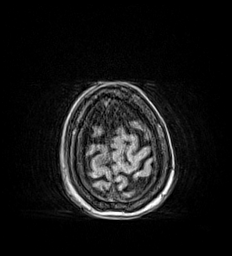
[im 138/176]
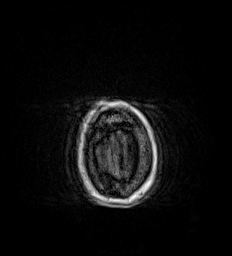
[im 151/176]
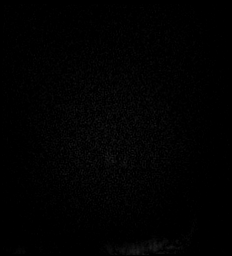
[im 163/176]
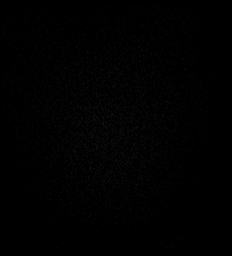
[im 176/176]
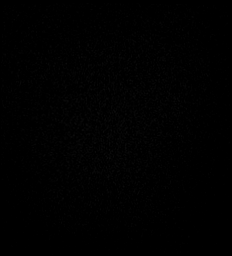

[Series 17: T2 · coronal · 5.0mm · 0.45mm/px · 3 of 31 slices shown]
[im 1/31]
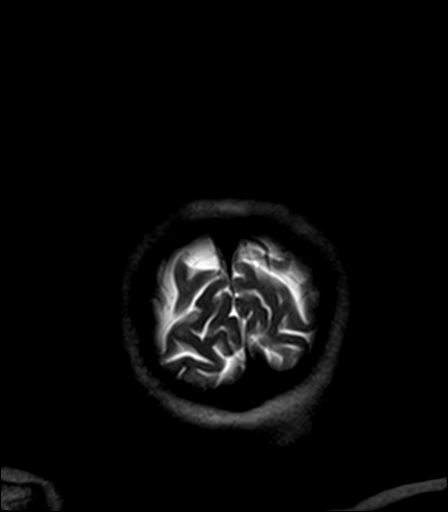
[im 16/31]
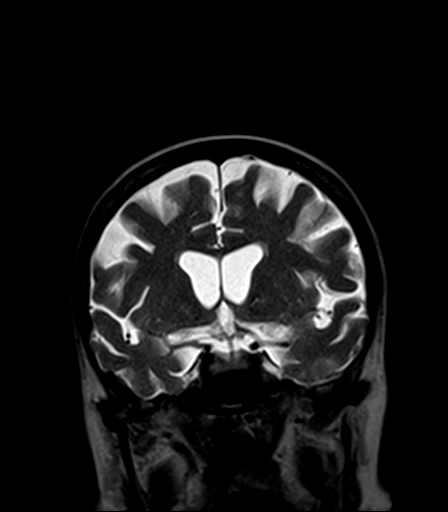
[im 31/31]
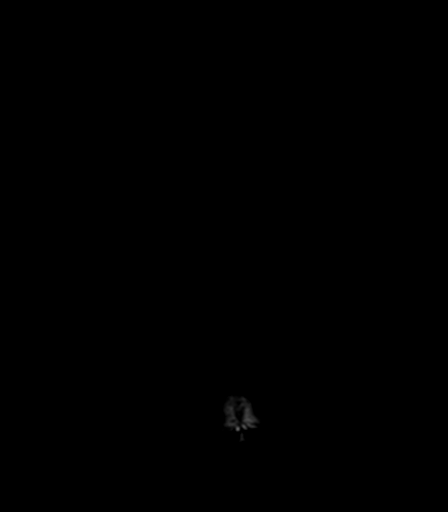

[Series 19: T1 post-contrast · coronal · 5.0mm · 0.57mm/px · 2 of 29 slices shown (1 of 2)]
[im 1/29]
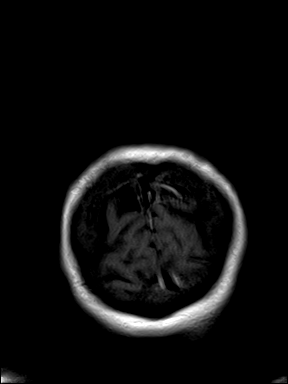
[im 29/29]
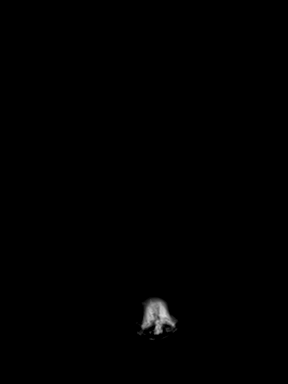

[Series 20: T1 post-contrast · sagittal · 5.0mm · 0.62mm/px · 2 of 21 slices shown (2 of 2)]
[im 1/21]
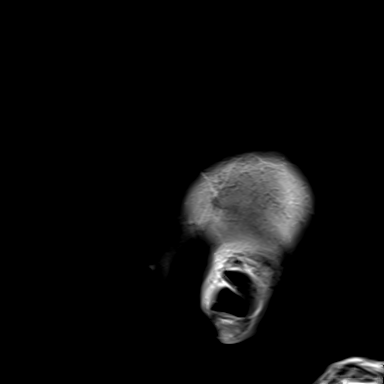
[im 21/21]
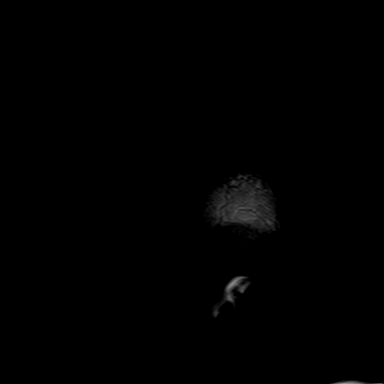

[48 of 48 positions shown; findings below may reference images not displayed]

FINDINGS: The study is motion degraded throughout including moderate to severe
motion on postcontrast imaging.

Brain: There are at least 11 small rounded foci of diffusion
weighted signal abnormality involving cortex/gray-white junction
predominantly posteriorly in the right greater than left cerebral
hemispheres. The largest measures 1.5 cm in the high posterior right
frontal lobe with mild surrounding edema. There is also evidence of
mild edema surrounding a right temporal-occipital lesion. Assessment
for enhancement is severely limited by motion artifact, however
there is the suggestion of some enhancement of the dominant lesion
in the right frontal lobe (series 20, image 10).

No gross intracranial hemorrhage, midline shift, or extra-axial
fluid collection is identified. There is moderate cerebral atrophy.
Periventricular white matter T2 hyperintensities are nonspecific but
compatible with minimal chronic small vessel ischemic disease. No
definite lesions are identified in the posterior fossa.

Vascular: Major intracranial vascular flow voids are preserved.

Skull and upper cervical spine: Abnormal marrow signal in the C2
vertebral body and posterior elements, dens, and posterior C3
vertebral body.

Sinuses/Orbits: Bilateral cataract extraction. Trace right maxillary
sinus mucosal thickening. Small bilateral mastoid effusions.

Other: None.
IMPRESSION: 1. Severely motion degraded examination.
2. Multiple small brain lesions with at most mild edema most
consistent with metastatic disease. No mass effect.
3. Abnormal marrow signal in the upper cervical spine concerning for
metastatic disease.

## 2020-11-12 MED ORDER — DEXAMETHASONE 4 MG PO TABS
4.0000 mg | ORAL_TABLET | Freq: Two times a day (BID) | ORAL | Status: DC
  Administered 2020-11-12 – 2020-11-15 (×6): 4 mg via ORAL
  Filled 2020-11-12 (×7): qty 1

## 2020-11-12 MED ORDER — DEXAMETHASONE 4 MG PO TABS
4.0000 mg | ORAL_TABLET | Freq: Two times a day (BID) | ORAL | Status: DC
  Filled 2020-11-12: qty 1

## 2020-11-12 MED ORDER — GADOBUTROL 1 MMOL/ML IV SOLN
5.0000 mL | Freq: Once | INTRAVENOUS | Status: AC | PRN
  Administered 2020-11-12: 5 mL via INTRAVENOUS

## 2020-11-12 NOTE — Plan of Care (Signed)
  Problem: Education: Goal: Knowledge of General Education information will improve Description: Including pain rating scale, medication(s)/side effects and non-pharmacologic comfort measures 11/12/2020 1828 by Cristela Blue, RN Outcome: Progressing 11/12/2020 1828 by Cristela Blue, RN Outcome: Progressing   Problem: Health Behavior/Discharge Planning: Goal: Ability to manage health-related needs will improve 11/12/2020 1828 by Cristela Blue, RN Outcome: Progressing 11/12/2020 1828 by Cristela Blue, RN Outcome: Progressing   Problem: Clinical Measurements: Goal: Ability to maintain clinical measurements within normal limits will improve 11/12/2020 1828 by Cristela Blue, RN Outcome: Progressing 11/12/2020 1828 by Cristela Blue, RN Outcome: Progressing Goal: Will remain free from infection 11/12/2020 1828 by Cristela Blue, RN Outcome: Progressing 11/12/2020 1828 by Cristela Blue, RN Outcome: Progressing Goal: Diagnostic test results will improve 11/12/2020 1828 by Cristela Blue, RN Outcome: Progressing 11/12/2020 1828 by Cristela Blue, RN Outcome: Progressing Goal: Respiratory complications will improve 11/12/2020 1828 by Cristela Blue, RN Outcome: Progressing 11/12/2020 1828 by Cristela Blue, RN Outcome: Progressing Goal: Cardiovascular complication will be avoided 11/12/2020 1828 by Cristela Blue, RN Outcome: Progressing 11/12/2020 1828 by Cristela Blue, RN Outcome: Progressing   Problem: Activity: Goal: Risk for activity intolerance will decrease 11/12/2020 1828 by Cristela Blue, RN Outcome: Progressing 11/12/2020 1828 by Cristela Blue, RN Outcome: Progressing   Problem: Nutrition: Goal: Adequate nutrition will be maintained 11/12/2020 1828 by Cristela Blue, RN Outcome: Progressing 11/12/2020 1828 by Cristela Blue, RN Outcome: Progressing   Problem: Coping: Goal: Level of anxiety will decrease 11/12/2020 1828 by Cristela Blue, RN Outcome:  Progressing 11/12/2020 1828 by Cristela Blue, RN Outcome: Progressing   Problem: Elimination: Goal: Will not experience complications related to bowel motility 11/12/2020 1828 by Cristela Blue, RN Outcome: Progressing 11/12/2020 1828 by Cristela Blue, RN Outcome: Progressing Goal: Will not experience complications related to urinary retention 11/12/2020 1828 by Cristela Blue, RN Outcome: Progressing 11/12/2020 1828 by Cristela Blue, RN Outcome: Progressing   Problem: Pain Managment: Goal: General experience of comfort will improve 11/12/2020 1828 by Cristela Blue, RN Outcome: Progressing 11/12/2020 1828 by Cristela Blue, RN Outcome: Progressing   Problem: Safety: Goal: Ability to remain free from injury will improve 11/12/2020 1828 by Cristela Blue, RN Outcome: Progressing 11/12/2020 1828 by Cristela Blue, RN Outcome: Progressing   Problem: Skin Integrity: Goal: Risk for impaired skin integrity will decrease 11/12/2020 1828 by Cristela Blue, RN Outcome: Progressing 11/12/2020 1828 by Cristela Blue, RN Outcome: Progressing

## 2020-11-12 NOTE — Progress Notes (Signed)
Physical Therapy Treatment Patient Details Name: Danielle Steele MRN: [id number] DOB: Jun 24, 1941 Today's Date: 11/12/2020    History of Present Illness Pt is a 80 y/o F with PMH: HTN, HLD, DM, GERD, pre-diabetes, schizophrenia, bipolar disorder, depression, anxiety and tremors who presented to ED from [facility] d/t abdominal pain, hypotension and altered mental status. Abdominal pain etiology unclear, per MD note: CT w/ mildly distended gallbladder but no overt thickening, abd [place] negative and LFTs normal. Pt adm d/t meeting sepsis criteria with WBC of 24.1. Suspect septic 2/2 UTI.    PT Comments    Patient agreeable to PT. Patient is confused during session and on arrival to room was trying to making a phone call from her remote control to the TV. Patient is easily distracted with tangential speech at times, labile at times, and needs frequent cues for attention to task and tactile cues to follow through with mobility instruction. Patient does complain about mild pain in her abdominal area that does not seem to worsen with activity. Patient needs assistance for bed mobility, transfers, and has poor standing balance. Unable to progress to ambulation this session due to limited standing tolerance. Recommend to continue PT to maximize independence and facilitate return to prior level of function. SNF is recommended at discharge.     Follow Up Recommendations  SNF     Equipment Recommendations  None recommended by PT (to be determined at next level of care)    Recommendations for Other Services       Precautions / Restrictions Precautions Precautions: Fall Restrictions Weight Bearing Restrictions: No    Mobility  Bed Mobility Overal bed mobility: Needs Assistance Bed Mobility: Supine to Sit;Sit to Supine     Supine to sit: Min assist Sit to supine: Min assist   General bed mobility comments: assistance for LE support. verbal and tacitle cues required at  times. increased time for motor planning and to follow through with commands    Transfers Overall transfer level: Needs assistance Equipment used: Rolling walker (2 wheeled) Transfers: Sit to/from Stand Sit to Stand: Min assist         General transfer comment: lifting assistance required for standing. verbal and tactile cues for hand placement  Ambulation/Gait             General Gait Details: unable to progress to ambulation due to poor standing balance, distraction (patient signing and trying to dance), overall unsafe and high fall risk   Stairs             Wheelchair Mobility    Modified Rankin (Stroke Patients Only)       Balance Overall balance assessment: Needs assistance Sitting-balance support: Bilateral upper extremity supported;Feet supported Sitting balance-Leahy Scale: Fair Sitting balance - Comments: kyphotic posture, no gross loss of balance however patient using BUE for support to maintain midline   Standing balance support: Bilateral upper extremity supported Standing balance-Leahy Scale: Poor Standing balance comment: patient required Min A to maintain standing balance with rolling walker for UE support                            Cognition Arousal/Alertness: Awake/alert Behavior During Therapy: Restless;Impulsive Overall Cognitive Status: Impaired/Different from baseline Area of Impairment: Attention;Orientation;Safety/judgement                 Orientation Level: Disoriented to;Place;Time;Situation             General Comments:  patient is able to follow single step commands with extra time and tactile cueing. tangential speech a times and easily distracted, needs cues for attention to task      Exercises General Exercises - Lower Extremity Ankle Circles/Pumps: AROM;Strengthening;Both;10 reps;Supine Heel Slides: AAROM;Strengthening;Both;10 reps;Supine Hip ABduction/ADduction: AAROM;Strengthening;Both;10  reps;Supine Other Exercises Other Exercises: verbal and tactile cues for exercise technique for strengthening for BLE    General Comments        Pertinent Vitals/Pain Pain Assessment: Faces Faces Pain Scale: Hurts a little bit Pain Location: states abdomen ("all the way through to her back") Pain Descriptors / Indicators: Sore Pain Intervention(s): Monitored during session    Home Living                      Prior Function            PT Goals (current goals can now be found in the care plan section) Acute Rehab PT Goals Patient Stated Goal: none stated PT Goal Formulation: With patient Time For Goal Achievement: 11/24/20 Potential to Achieve Goals: Fair Progress towards PT goals: Progressing toward goals    Frequency    Min 2X/week      PT Plan Current plan remains appropriate    Co-evaluation              AM-PAC PT "6 Clicks" Mobility   Outcome Measure  Help needed turning from your back to your side while in a flat bed without using bedrails?: A Little Help needed moving from lying on your back to sitting on the side of a flat bed without using bedrails?: A Little Help needed moving to and from a bed to a chair (including a wheelchair)?: A Lot Help needed standing up from a chair using your arms (e.g., wheelchair or bedside chair)?: A Little Help needed to walk in hospital room?: A Lot Help needed climbing 3-5 steps with a railing? : Total 6 Click Score: 14    End of Session Equipment Utilized During Treatment: Gait belt Activity Tolerance: Patient limited by fatigue Patient left: in bed;with call bell/phone within reach;with bed alarm set;with family/visitor present Nurse Communication: Mobility status PT Visit Diagnosis: Unsteadiness on feet (R26.81);Difficulty in walking, not elsewhere classified (R26.2);Muscle weakness (generalized) (M62.81)     Time: 5027-7412 PT Time Calculation (min) (ACUTE ONLY): 23 min  Charges:  $Therapeutic  Exercise: 8-22 mins $Therapeutic Activity: 8-22 mins                     Minna Merritts, PT, MPT    Percell Locus 11/12/2020, 2:59 PM

## 2020-11-12 NOTE — Progress Notes (Signed)
Occupational Therapy Treatment Patient Details Name: Danielle Steele MRN: [id number] DOB: 03-Jun-1941 Today's Date: 11/12/2020    History of present illness Pt is a 80 y/o F with PMH: HTN, HLD, DM, GERD, pre-diabetes, schizophrenia, bipolar disorder, depression, anxiety and tremors who presented to ED from [facility] assisted [facility] d/t abdominal pain, hypotension and altered mental status. Abdominal pain etiology unclear, per MD note: CT w/ mildly distended gallbladder but no overt thickening, abd [place] negative and LFTs normal. Pt adm d/t meeting sepsis criteria with WBC of 24.1. Suspect septic 2/2 UTI.   OT comments  Danielle Steele was seen for OT treatment on this date. Upon arrival to room pt reclined in bed, agreeable to tx. Pt is disoriented but recalls therapy session earlier this date, family arrives to room near end of session. MIN A for bed mobility and x10 sit<>stand trials at EOB. Pt completed seated therex as described below. MIN A for seated ADLs - progressive L lateral lean noted t/o session, unable to self correct with R rail use. MOD A don/doff B socks seated EOB. Pt making good progress toward goals. Pt continues to benefit from skilled OT services to maximize return to PLOF and minimize risk of future falls, injury, caregiver burden, and readmission. Will continue to follow POC. Discharge recommendation remains appropriate.    Follow Up Recommendations  SNF    Equipment Recommendations  Other (comment) (defer)    Recommendations for Other Services      Precautions / Restrictions Precautions Precautions: Fall Restrictions Weight Bearing Restrictions: No       Mobility Bed Mobility Overal bed mobility: Needs Assistance Bed Mobility: Supine to Sit;Sit to Supine     Supine to sit: Min assist Sit to supine: Min assist   General bed mobility comments: assist to square to EOB    Transfers Overall transfer level: Needs assistance Equipment used: Rolling  walker (2 wheeled) Transfers: Sit to/from Stand Sit to Stand: Min assist         General transfer comment: lifting assistance required for standing. verbal and tactile cues for hand placement    Balance Overall balance assessment: Needs assistance Sitting-balance support: Bilateral upper extremity supported;Feet supported Sitting balance-Leahy Scale: Poor Sitting balance - Comments: MOD A to correct L lateral lean Postural control: Left lateral lean Standing balance support: Bilateral upper extremity supported Standing balance-Leahy Scale: Poor Standing balance comment: patient required Min A to maintain standing balance with rolling walker for UE support                           ADL either performed or assessed with clinical judgement   ADL Overall ADL's : Needs assistance/impaired                                       General ADL Comments: MIN A for seated ADLs - progressive L lateral lean noted t/o session, unable to self correct with R rail use. MOD A don/doff B socks seated EOB. MIN A for ADL t/f               Cognition Arousal/Alertness: Awake/alert Behavior During Therapy: Restless;Impulsive Overall Cognitive Status: Impaired/Different from baseline Area of Impairment: Attention;Orientation;Safety/judgement                 Orientation Level: Disoriented to;Place;Time Current Attention Level: Alternating  General Comments: patient is able to follow single step commands with extra time and tactile cueing. tangential speech a times and easily distracted, needs cues for attention to task        Exercises Exercises: General Lower Extremity;Other exercises;General Upper Extremity General Exercises - Upper Extremity Shoulder Flexion: AROM;Strengthening;Both;20 reps;Seated Shoulder Extension: AROM;Strengthening;Both;20 reps;Seated Elbow Flexion: AROM;Strengthening;Both;20 reps;Seated Elbow Extension:  AROM;Strengthening;Both;20 reps;Seated Chair Push Up: AROM;Strengthening;Both;20 reps;Seated General Exercises - Lower Extremity Long Arc Quad: AROM;Strengthening;Both;20 reps;Seatede Straight Leg Raises: AROM;Strengthening;Both;20 reps;Seated Hip Flexion/Marching: AROM;Strengthening;Both;20 reps;Seated Other Exercises Other Exercises: Pt and family educated re: OT role, DME recs, d/c recs, falls prevention, HEP Other Exercises: LBD< sup<>sit x2, sit<>stand x10, sitting/standing balance/tolerance           Pertinent Vitals/ Pain       Pain Assessment: Faces Faces Pain Scale: Hurts little more Pain Location: back pain Pain Descriptors / Indicators: Sore Pain Intervention(s): Limited activity within patient's tolerance;Repositioned         Frequency  Min 1X/week        Progress Toward Goals  OT Goals(current goals can now be found in the care plan section)  Progress towards OT goals: Progressing toward goals  Acute Rehab OT Goals Patient Stated Goal: to walk OT Goal Formulation: With patient Time For Goal Achievement: 11/23/20 Potential to Achieve Goals: Fair ADL Goals Pt Will Perform Grooming: with supervision;sitting Pt Will Perform Lower Body Dressing: with min assist;sit to/from stand Pt Will Transfer to Toilet: with min guard assist;ambulating;bedside commode Pt/caregiver will Perform Home Exercise Program: Increased strength;Both right and left upper extremity;With minimal assist  Plan Discharge plan remains appropriate;Frequency remains appropriate       AM-PAC OT "6 Clicks" Daily Activity     Outcome Measure   Help from another person eating meals?: None Help from another person taking care of personal grooming?: A Little Help from another person toileting, which includes using toliet, bedpan, or urinal?: A Lot Help from another person bathing (including washing, rinsing, drying)?: A Lot Help from another person to put on and taking off regular upper body  clothing?: A Little Help from another person to put on and taking off regular lower body clothing?: A Lot 6 Click Score: 16    End of Session    OT Visit Diagnosis: Unsteadiness on feet (R26.81);Muscle weakness (generalized) (M62.81);Other symptoms and signs involving cognitive function   Activity Tolerance Patient tolerated treatment well   Patient Left in bed;with call bell/phone within reach;with bed alarm set;with family/visitor present   Nurse Communication Patient requests pain meds        Time: 7425-9563 OT Time Calculation (min): 30 min  Charges: OT General Charges $OT Visit: 1 Visit OT Treatments $Self Care/Home Management : 8-22 mins $Therapeutic Exercise: 8-22 mins  Dessie Coma, M.S. OTR/L  11/12/20, 4:21 PM  ascom [phone]

## 2020-11-12 NOTE — Progress Notes (Signed)
PT Cancellation Note  Patient Details Name: Danielle Steele MRN: [id number] DOB: March 19, 1941   Cancelled Treatment:    Reason Eval/Treat Not Completed: Patient at procedure or test/unavailable. Patient is off the floor at MRI. PT will continue with attempts as patient available.   Minna Merritts, PT, MPT  Percell Locus 11/12/2020, 10:28 AM

## 2020-11-12 NOTE — Progress Notes (Signed)
Hematology/Oncology Consult note [facility]  [phone] [phone]  Patient Care Team: Ann Held as PCP - General (Physician Assistant)   Name of the patient: Danielle Steele  [id number]  Feb 22, 1941   Date of visit: 11/12/20   Interval history-patient is awake and conversant today.  Reports ongoing back pain and pain that radiates around her lower chest wall.  Her brother and sister-in-law are at the bedside.  She remains confused  ECOG PS- 3 Pain scale-unable to quantify   Review of systems- Review of Systems  Constitutional: Positive for malaise/fatigue. Negative for chills, fever and weight loss.  HENT: Negative for congestion, ear discharge and nosebleeds.   Eyes: Negative for blurred vision.  Respiratory: Negative for cough, hemoptysis, sputum production, shortness of breath and wheezing.   Cardiovascular: Negative for chest pain, palpitations, orthopnea and claudication.  Gastrointestinal: Negative for abdominal pain, blood in stool, constipation, diarrhea, heartburn, melena, nausea and vomiting.  Genitourinary: Negative for dysuria, flank pain, frequency, hematuria and urgency.  Musculoskeletal: Positive for back pain. Negative for joint pain and myalgias.  Skin: Negative for rash.  Neurological: Negative for dizziness, tingling, focal weakness, seizures, weakness and headaches.  Endo/Heme/Allergies: Does not bruise/bleed easily.  Psychiatric/Behavioral: Negative for depression and suicidal ideas. The patient does not have insomnia.      Allergies  Allergen Reactions  . Citrus Rash     Past Medical History:  Diagnosis Date  . Anxiety   . Bipolar disorder ([place])   . Depression   . GERD (gastroesophageal reflux disease)   . Hypertension   . PND (post-nasal drip)    CAUSES SOME COUGH  . Pre-diabetes   . Schizophrenia ([place])    SCHIZO  AFFECTIVE DISORDER  . Thyroid nodule   . Tremors of nervous  system      Past Surgical History:  Procedure Laterality Date  . CATARACT EXTRACTION W/PHACO Left 09/07/2017   Procedure: CATARACT EXTRACTION PHACO AND INTRAOCULAR LENS PLACEMENT (IOC);  Surgeon: Birder Robson, MD;  Location: [facility] ORS;  Service: Ophthalmology;  Laterality: Left;  [place] 00:39.9 AP% 17.4 CDE 6.94 Fluid Pack lot # [id number]  . CATARACT EXTRACTION W/PHACO Right 09/28/2017   Procedure: CATARACT EXTRACTION PHACO AND INTRAOCULAR LENS PLACEMENT (IOC);  Surgeon: Birder Robson, MD;  Location: [facility] ORS;  Service: Ophthalmology;  Laterality: Right;  [place] 01:31.8 AP% 13.7 CDE 12.55 Fluid Pack Lot # [id number]   . EYE SURGERY    . TONSILLECTOMY      Social History   Socioeconomic History  . Marital status: Married    Spouse name: Not on file  . Number of children: Not on file  . Years of education: Not on file  . Highest education level: Not on file  Occupational History  . Not on file  Tobacco Use  . Smoking status: Former Research scientist (life sciences)  . Smokeless tobacco: Never Used  Vaping Use  . Vaping Use: Never used  Substance and Sexual Activity  . Alcohol use: No  . Drug use: No  . Sexual activity: Not on file  Other Topics Concern  . Not on file  Social History Narrative  . Not on file   Social Determinants of Health   Financial Resource Strain: Not on file  Food Insecurity: Not on file  Transportation Needs: Not on file  Physical Activity: Not on file  Stress: Not on file  Social Connections: Not on file  Intimate Partner Violence: Not on file    History reviewed. No  pertinent family history.   Current Facility-Administered Medications:  .  0.9 %  sodium chloride infusion, , Intravenous, PRN, Barb Merino, MD, Stopped at 11/10/20 1133 .  acetaminophen (TYLENOL) tablet 650 mg, 650 mg, Oral, Q6H PRN, Ivor Costa, MD, 650 mg at 11/11/20 1441 .  acidophilus (RISAQUAD) capsule 1 capsule, 1 capsule, Oral, Daily, Barb Merino, MD, 1 capsule at 11/12/20 1047 .   atorvastatin (LIPITOR) tablet 20 mg, 20 mg, Oral, QPM, Ivor Costa, MD, 20 mg at 11/11/20 2156 .  benztropine (COGENTIN) tablet 0.5 mg, 0.5 mg, Oral, BID, Ivor Costa, MD, 0.5 mg at 11/12/20 1048 .  buPROPion (WELLBUTRIN XL) 24 hr tablet 150 mg, 150 mg, Oral, Gaye Alken, MD, 150 mg at 11/12/20 1047 .  cholecalciferol (VITAMIN D) tablet 2,000 Units, 2,000 Units, Oral, q AM, Ivor Costa, MD, 2,000 Units at 11/12/20 1048 .  clonazePAM (KLONOPIN) tablet 0.5 mg, 0.5 mg, Oral, BID PRN, Barb Merino, MD .  dexamethasone (DECADRON) tablet 4 mg, 4 mg, Oral, Q12H, Borders, Vonna Kotyk R, NP .  enoxaparin (LOVENOX) injection 40 mg, 40 mg, Subcutaneous, Q24H, Shanlever, Pierce Crane, RPH, 40 mg at 11/12/20 1051 .  guaiFENesin (ROBITUSSIN) 100 MG/5ML solution 300 mg, 300 mg, Oral, Q6H PRN, Ivor Costa, MD .  lithium carbonate capsule 150 mg, 150 mg, Oral, BID WC, Ivor Costa, MD, 150 mg at 11/12/20 1049 .  loperamide (IMODIUM) capsule 4 mg, 4 mg, Oral, PRN, Ivor Costa, MD .  loratadine (CLARITIN) tablet 10 mg, 10 mg, Oral, Daily, Ivor Costa, MD, 10 mg at 11/12/20 1047 .  magnesium hydroxide (MILK OF MAGNESIA) suspension 30 mL, 30 mL, Oral, Daily PRN, Ivor Costa, MD, 30 mL at 11/11/20 1125 .  morphine 2 MG/ML injection 0.5 mg, 0.5 mg, Intravenous, Q4H PRN, Ivor Costa, MD .  ondansetron (ZOFRAN) injection 4 mg, 4 mg, Intravenous, Q8H PRN, Ivor Costa, MD .  traZODone (DESYREL) tablet 50 mg, 50 mg, Oral, QHS, Ivor Costa, MD, 50 mg at 11/11/20 2156  Physical exam:  Vitals:   11/11/20 1517 11/11/20 1947 11/11/20 2308 11/12/20 0643  BP: 117/60 (!) 140/113 96/85 120/61  Pulse: 72 98 85 80  Resp:  18 20 16   Temp: 97.8 F (36.6 C) 97.8 F (36.6 C) (!) 97.5 F (36.4 C) 97.7 F (36.5 C)  TempSrc:  Oral Axillary Oral  SpO2: 100% 97%  95%  Weight:      Height:       Physical Exam Constitutional:      General: She is not in acute distress. Cardiovascular:     Rate and Rhythm: Normal rate and regular rhythm.      Heart sounds: Normal heart sounds.  Pulmonary:     Effort: Pulmonary effort is normal.     Breath sounds: Normal breath sounds.  Abdominal:     General: Bowel sounds are normal.     Palpations: Abdomen is soft.  Musculoskeletal:     Cervical back: Normal range of motion.  Skin:    General: Skin is warm and dry.  Neurological:     Mental Status: She is alert.     Comments: Oriented to self alone      CMP Latest Ref Rng & Units 11/10/2020  Glucose 70 - 99 mg/dL 100(H)  BUN 8 - 23 mg/dL 14  Creatinine 0.44 - 1.00 mg/dL 0.96  Sodium 135 - 145 mmol/L 140  Potassium 3.5 - 5.1 mmol/L 3.7  Chloride 98 - 111 mmol/L 112(H)  CO2 22 - 32 mmol/L  21(L)  Calcium 8.9 - 10.3 mg/dL 9.8  Total Protein 6.5 - 8.1 g/dL 6.2(L)  Total Bilirubin 0.3 - 1.2 mg/dL 0.4  Alkaline Phos 38 - 126 U/L 93  AST 15 - 41 U/L 16  ALT 0 - 44 U/L 11   CBC Latest Ref Rng & Units 11/11/2020  WBC 4.0 - 10.5 K/uL 24.1(H)  Hemoglobin 12.0 - 15.0 g/dL 11.8(L)  Hematocrit 36.0 - 46.0 % 35.3(L)  Platelets 150 - 400 K/uL 318    @IMAGES @  CT ABDOMEN PELVIS WO CONTRAST  Result Date: 11/08/2020 CLINICAL DATA:  Abdominal pain and fever EXAM: CT ABDOMEN AND PELVIS WITHOUT CONTRAST TECHNIQUE: Multidetector CT imaging of the abdomen and pelvis was performed following the standard protocol without oral or IV contrast. COMPARISON:  None. FINDINGS: Lower chest: There is bibasilar atelectatic change. No consolidation in the lung bases. There are foci of coronary artery calcification. Hepatobiliary: No focal liver lesions are appreciable. The gallbladder appears distended. Gallbladder wall does not appear appreciably thickened by CT. No biliary duct dilatation evident. Pancreas: There is no pancreatic mass or inflammatory focus. Spleen: No splenic lesions are evident. Adrenals/Urinary Tract: There is mild adrenal hypertrophy bilaterally. No focal adrenal lesions evident. There is no appreciable renal mass or hydronephrosis on either  side. There is no evident renal or ureteral calculus on either side. The urinary bladder appears distended without wall thickening. Stomach/Bowel: There is no appreciable bowel wall or mesenteric thickening. There is moderate stool and air in the colon. There is no evident bowel obstruction. Terminal ileum appears unremarkable. Appendix appears normal. No evident free air or portal venous air. Vascular/Lymphatic: There is no abdominal aortic aneurysm. There is extensive aortic and iliac artery atherosclerotic calcification. No adenopathy is evident in the abdomen or pelvis. Reproductive: Uterus is mildly canted to the left. No adnexal masses are evident. Other: No evident abscess or ascites in the abdomen or pelvis. Musculoskeletal: Relative sclerosis noted in the right iliac bone in a portion of the right acetabulum with mildly irregular periosteum, likely due to Paget's disease. No lytic or destructive lesions are evident. There is degenerative change in each pubic symphysis. No intramuscular lesions evident. IMPRESSION: 1. 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 Note that there is mildly irregular periosteum in this area. An atypical bony neoplasm could potentially present in this manner. Given this appearance of the periosteum in this area, correlation with MR of the right iliac crest and acetabulum is advised to further evaluate. 2. Gallbladder is mildly distended without overt wall thickening by CT. No biliary duct dilatation. Correlation with ultrasound of the gallbladder may be advisable in this circumstance. 3. Urinary bladder distended without wall thickening. No renal or ureteral calculus. No hydronephrosis on either side. 4. No evident bowel obstruction. No abscess in the abdomen or pelvis. Appendix region appears normal. 5. Aortic Atherosclerosis (ICD10-I70.0). Foci of coronary artery and iliac artery atherosclerotic calcification noted. 6. Mild adrenal hypertrophy  bilaterally without focal adrenal lesion. Significance of mild adrenal hypertrophy is uncertain in this age group. Electronically Signed   By: Lowella Grip III M.D.   On: 11/08/2020 10:30   CT HEAD WO CONTRAST  Result Date: 11/08/2020 CLINICAL DATA:  Mental status change EXAM: CT HEAD WITHOUT CONTRAST TECHNIQUE: Contiguous axial images were obtained from the base of the skull through the vertex without intravenous contrast. COMPARISON:  October 10, 2020 FINDINGS: Brain: No evidence of acute territorial infarction, hemorrhage, hydrocephalus,extra-axial collection or mass lesion/mass effect. There is dilatation  the ventricles and sulci consistent with age-related atrophy. Low-attenuation changes in the deep white matter consistent with small vessel ischemia. Vascular: No hyperdense vessel or unexpected calcification. Skull: The skull is intact. No fracture or focal lesion identified. Sinuses/Orbits: The visualized paranasal sinuses and mastoid air cells are clear. The orbits and globes intact. Other: None IMPRESSION: No acute intracranial abnormality. Findings consistent with age related atrophy and chronic small vessel ischemia Electronically Signed   By: Prudencio Pair M.D.   On: 11/08/2020 15:58   CT GUIDED NEEDLE PLACEMENT  Result Date: 11/11/2020 INDICATION: No known primary, now with multiple of rest of osseous lesions worrisome for metastatic disease. Please perform CT-guided biopsy of permeative lesion involving the anterior aspect of the right ilium for tissue diagnostic purposes. Note, patient also found to have malignant-appearing retroperitoneal lymph nodes however given the size and location of the periaortic lymph nodes decision was made to initially proceed with CT-guided bone lesion biopsy in hopes this biopsy will provided tissue diagnosis in this patient with history of schizophrenia and uncertain ability to tolerate and/or cooperate with a CT-guided biopsy. EXAM: CT-GUIDED BIOPSY INVOLVING  THE ANTERIOR ASPECT OF THE RIGHT ILIUM. MEDICATIONS: None COMPARISON:  CT the chest, abdomen and pelvis-earlier same day; pelvic MRI-11/09/2020 ANESTHESIA/SEDATION: Fentanyl 50 mcg IV; Versed 1 mg IV Sedation time: 18 minutes; The patient was continuously monitored during the procedure by the interventional radiology nurse under my direct supervision. COMPLICATIONS: None immediate. PROCEDURE: Informed consent was obtained from the patient's family following an explanation of the procedure, risks, benefits and alternatives. The patient understands, agrees and consents for the procedure. All questions were addressed. A time out was performed prior to the initiation of the procedure. The patient was positioned supine on the CT table and a limited CT was performed for procedural planning demonstrating a permeative lesion involving the anterior aspect of the right ilium. The procedure was planned. The operative site was prepped and draped in the usual sterile fashion. Appropriate trajectory was confirmed with a 22 gauge spinal needle after the adjacent tissues were anesthetized with 1% Lidocaine with epinephrine. Next, an 11 gauge coaxial bone biopsy needle was advanced into anterior aspect of the right ilium. The inner 13 gauge coil axial bone biopsy device was then utilized to acquire the initial sample after appropriate position was confirmed with CT imaging (image 11, series 3). Finally, the outer 11 gauge bone biopsy device was utilized to acquire an additional sample (image 15, series 3). The needle was removed and superficial hemostasis was obtained with manual compression. A dressing was applied. The patient tolerated the procedure well without immediate post procedural complication. IMPRESSION: Successful CT guided biopsy of permanent lesion involving the anterior aspect of the right ilium. PLAN: As above, CT-guided bone lesion biopsy was initially pursued given patient's history of schizophrenia and uncertain  ability to tolerate and/or cooperate with CT-guided biopsy however ultimately if additional tissue is required attempted CT-guided retroperitoneal lymph node biopsy could be performed as indicated. Electronically Signed   By: Sandi Mariscal M.D.   On: 11/11/2020 11:21   MR BRAIN W WO CONTRAST  Result Date: 11/12/2020 CLINICAL DATA:  Altered mental status. Recently discovered metastatic disease. EXAM: MRI HEAD WITHOUT AND WITH CONTRAST TECHNIQUE: Multiplanar, multiecho pulse sequences of the brain and surrounding structures were obtained without and with intravenous contrast. CONTRAST:  64mL GADAVIST GADOBUTROL 1 MMOL/ML IV SOLN COMPARISON:  Head CT 11/08/2020 FINDINGS: The study is motion degraded throughout including moderate to severe motion on postcontrast imaging.  Brain: There are at least 11 small rounded foci of diffusion weighted signal abnormality involving cortex/gray-white junction predominantly posteriorly in the right greater than left cerebral hemispheres. The largest measures 1.5 cm in the high posterior right frontal lobe with mild surrounding edema. There is also evidence of mild edema surrounding a right temporal-occipital lesion. Assessment for enhancement is severely limited by motion artifact, however there is the suggestion of some enhancement of the dominant lesion in the right frontal lobe (series 20, image 10). No gross intracranial hemorrhage, midline shift, or extra-axial fluid collection is identified. There is moderate cerebral atrophy. Periventricular white matter T2 hyperintensities are nonspecific but compatible with minimal chronic small vessel ischemic disease. No definite lesions are identified in the posterior fossa. Vascular: Major intracranial vascular flow voids are preserved. Skull and upper cervical spine: Abnormal marrow signal in the C2 vertebral body and posterior elements, dens, and posterior C3 vertebral body. Sinuses/Orbits: Bilateral cataract extraction. Trace right  maxillary sinus mucosal thickening. Small bilateral mastoid effusions. Other: None. IMPRESSION: 1. Severely motion degraded examination. 2. Multiple small brain lesions with at most mild edema most consistent with metastatic disease. No mass effect. 3. Abnormal marrow signal in the upper cervical spine concerning for metastatic disease. Electronically Signed   By: Logan Bores M.D.   On: 11/12/2020 13:00   MR PELVIS W WO CONTRAST  Result Date: 11/10/2020 CLINICAL DATA:  Right pelvic bone lesion on CT. EXAM: MRI PELVIS WITHOUT AND WITH CONTRAST TECHNIQUE: Multiplanar multisequence MR imaging of the pelvis was performed both before and after administration of intravenous contrast. CONTRAST:  43mL GADAVIST GADOBUTROL 1 MMOL/ML IV SOLN COMPARISON:  CT abdomen pelvis dated November 08, 2020. FINDINGS: Musculoskeletal: Large marrow replacing lesion within the right iliac bone extending into the acetabulum with associated periosteal reaction and surrounding reactive edema in the gluteus and iliacus muscles. Additional marrow replacing lesions in the left and right sacral ala (series 11, images 13 and 15) and S1 vertebral body (series 11, image 11. No definite soft tissue mass. No acute fracture or dislocation. Urinary Tract:  Foley catheter within the decompressed bladder. Bowel:  Unremarkable visualized pelvic bowel loops. Vascular/Lymphatic: No pathologically enlarged lymph nodes. No significant vascular abnormality seen. Reproductive:  No mass or other significant abnormality. Other:  None. IMPRESSION: 1. Multiple aggressive appearing marrow replacing lesions in the pelvis, the largest involving the right iliac bone and acetabulum. 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 Electronically Signed   By: Titus Dubin M.D.   On: 11/10/2020 12:59   CT CHEST ABDOMEN PELVIS W CONTRAST  Result Date: 11/11/2020 CLINICAL DATA:  Gastrointestinal cancer. Osseous metastatic disease. Initial staging examination EXAM: CT  CHEST, ABDOMEN, AND PELVIS WITH CONTRAST TECHNIQUE: Multidetector CT imaging of the chest, abdomen and pelvis was performed following the standard protocol during bolus administration of intravenous contrast. CONTRAST:  164mL OMNIPAQUE IOHEXOL 300 MG/ML  SOLN COMPARISON:  Noncontrast CT abdomen pelvis 11/08/2020, FINDINGS: CT CHEST FINDINGS Cardiovascular: Extensive multi-vessel coronary artery calcification. Global cardiac size within normal limits. No pericardial effusion. The central pulmonary arteries are of normal caliber. Mild atherosclerotic calcification within the thoracic aorta. No aortic aneurysm. Mediastinum/Nodes: There is necrotic appearing left hilar and subcarinal adenopathy. By example, left hilar adenopathy measures 2.3 x 2.5 cm at axial image # 26/2. Subcarinal adenopathy measures 1.5 x 2.9 cm at axial image # 30/2. The esophagus is unremarkable. Lungs/Pleura: Mild centrilobular emphysema. Mild bibasilar atelectasis. No focal pulmonary nodules or infiltrates. No pneumothorax or pleural effusion. Left hilar adenopathy results  in circumferential narrowing of the left lower lobar pulmonary bronchi. Musculoskeletal: Pathologic appearing compression fracture of T6 with near vertebral plana deformity. Minimal retropulsion of the posteroinferior component of the T6 vertebral body resulting in minimal central canal stenosis. Mixed lytic and sclerotic pattern within the a T3 vertebral body may reflect the presence of underlying metastatic disease in this location. No definite cortical erosion. No associated pathologic fracture. CT ABDOMEN PELVIS FINDINGS Hepatobiliary: No focal liver abnormality is seen. No gallstones, gallbladder wall thickening, or biliary dilatation. Pancreas: Unremarkable Spleen: Unremarkable Adrenals/Urinary Tract: The adrenal glands are unremarkable. The kidneys are normal in size and position. Multiple cortical hypodensities are seen within the kidneys bilaterally which are too  small to characterize. A a 17 mm cortical hypodensity, however, within the posterior interpolar region of the left kidney appears solid in nature and may represent a hypoenhancing mass, but is not optimally characterized on this exam. No hydronephrosis. No intrarenal or ureteral calculi. The bladder is unremarkable. Stomach/Bowel: There is polypoid fold thickening within the a mid body of the stomach, best seen on axial image # 53/2 and sagittal image # 122/6 and an underlying gastric mass is difficult to exclude. The stomach, small bowel, and large bowel are otherwise unremarkable save for moderate stool seen throughout the colon. No evidence of obstruction or focal inflammation. The appendix is not clearly identified and may be absent. No free intraperitoneal gas or fluid. Vascular/Lymphatic: Extensive aortoiliac atherosclerotic calcification. No aortic aneurysm. There is pathologic retroperitoneal adenopathy within the retrocrural, left periaortic, aortocaval, and retrocaval lymph node groups as well as the common iliac lymph node groups bilaterally. Index lymph node within the left periaortic lymph node group measures 1.9 x 2.0 cm at axial image # 74. Reproductive: Uterus and bilateral adnexa are unremarkable. Other: No abdominal wall hernia.  Rectum unremarkable. Musculoskeletal: Mild sclerosis involving the right ilium and left pubic symphysis. No associated pathologic fracture. With associated periosteal reaction is again identified, better assessed on accompanying MRI examination. The majority of these lesions, however, appear radiographically occult on this exam IMPRESSION: Pathologic left hilar, subcarinal, retrocrural, and retroperitoneal lymphadenopathy. Left periaortic lymphadenopathy may provide a viable target for CT-guided biopsy for tissue sampling. Polypoid fold thickening involving the mid body of the stomach. An underlying primary gastric mass is difficult to exclude on this examination. This  would be better assessed with endoscopy or upper gastrointestinal series. 17 mm hypoenhancing suspected solid mass within the left kidney. Dedicated renal sonography may be helpful, as an initial step, for further evaluation. Multiple osseous lesions in keeping with osseous metastatic disease. The majority of these lesions within the pelvis noted on prior MRI examination are radiographically occult. T6 suspected pathologic fracture. Additional possible metastasis involving the T3 vertebral body. This could be confirmed with contrast enhanced MRI examination. Extensive multi-vessel coronary artery calcification. Mild centrilobular emphysema. Aortic Atherosclerosis (ICD10-I70.0) and Emphysema (ICD10-J43.9). Electronically Signed   By: Fidela Salisbury MD   On: 11/11/2020 02:30   DG Chest Port 1 View  Result Date: 11/08/2020 CLINICAL DATA:  Questionable sepsis EXAM: PORTABLE CHEST 1 VIEW COMPARISON:  None. FINDINGS: Mild coarsening of lung markings. No focal pneumonia. No edema, effusion, or pneumothorax. Normal heart size and mediastinal contours. IMPRESSION: 1. No focal pneumonia. 2. Coarsened lung markings which could be chronic lung disease or atypical infection. Electronically Signed   By: Monte Fantasia M.D.   On: 11/08/2020 09:46   US ABDOMEN LIMITED RUQ (LIVER/GB)  Result Date: 11/08/2020 CLINICAL DATA:  Right upper quadrant  pain EXAM: ULTRASOUND ABDOMEN LIMITED RIGHT UPPER QUADRANT COMPARISON:  None. FINDINGS: Gallbladder: No gallstones or wall thickening visualized. No sonographic Murphy sign noted by sonographer. Common bile duct: Diameter: 7.5 mm, upper normal Liver: No focal lesion identified. Within normal limits in parenchymal echogenicity. Portal vein is patent on color Doppler imaging with normal direction of blood flow towards the liver. Other: None. IMPRESSION: Negative for gallstones. Common bile duct upper normal. No liver lesion. Electronically Signed   By: Franchot Gallo M.D.   On:  11/08/2020 12:45     Assessment and plan- Patient is a 80 y.o. female with prior history of schizophreniaAdmitted for acute encephalopathy.  She was found to have diffuse areas of lymphadenopathy and bony metastatic lesions on CT scan  Acute encephalopathy: Mental status is improved today and per family she is close to her baseline.MRI done today does show multiple areas of brain metastases with mild edema. I have therefore started her on 4 mg of Decadron twice daily.  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     Discussed with the family that regardless of what we find in terms of biopsy diagnosis brain mets are treated separately.  Untreated brain mets carry a prognosis of around 1 month.  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 Donella Stade this morning as well and he has agreed to see the patient and speak to the family about pros and cons of whole brain radiation if they desire to pursue that route.  Whole brain radiation can be associated with hair loss, cognitive decline as well.  Patient's sister-in-law tells me that patient is extremely fearful of hair loss and that may not go well with the patient.  They will discuss this with radiation oncology further today.   Lymphadenopathy/bone metastases: Patient had a bone biopsy yesterday.  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  IHC stains are pending.  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  Patient's performance status had declined significantly over the last 1 month.  Prior to that patient was ambulating with a walker but over the last 1 month she has been more bedbound.  Also in the setting of underlying schizophrenia it would be challenging to offer  outpatient chemotherapy.  As such her prognosis is poor and she would not be a candidate for systemic chemotherapy.  We discussed proceeding with the present plan of subacute rehab at [facility] care where she will also be followed by palliative care.  At the end of rehab completion-family would briefly faced with 3 options: Transition to long-term care, home with hospice or hospice home.  We discussed what each of these terminology is mean and the care involved for the patient.  Patient's brother [name] is her healthcare power of attorney.  Palliative care is already on board and I greatly appreciate NP Vonna Kotyk [employer] input in further assisting with goals of care.  Back pain: It is possible that the patient's back pain is coming from her T6 compression fracture.  We discussed considering kyphoplasty and/or palliative radiation to this area.  I have discussed this with Dr. Donella Stade as well who will take a look at her images and discuss the possibility of offering palliative radiation to this area.  It would be reasonable to  offer patient a low-dose morphine 5 mg solution every 4 hours as needed for palliation   Total face to face encounter time for this patient visit was 50 min.  >50% of time spent in counseling and coordination of care  Visit Diagnosis 1. AKI (acute kidney injury) ([place])   2. RUQ abdominal pain   3. Hematuria, unspecified type   4. Tumor cells, malignant ([place])   5. Bone metastases ([place])      Dr. Randa Evens, MD, MPH [facility] [phone] 11/13/2020 8:32 AM

## 2020-11-12 NOTE — Progress Notes (Signed)
PROGRESS NOTE    Aylana Hirschfeld  URK:[id number] DOB: April 23, 1941 DOA: 11/08/2020 PCP: Abby Potash, PA-C    Brief Narrative:  80 year old female with history of hypertension, hyperlipidemia, GERD, depression, anxiety, schizophrenia, bipolar disorder who lives in an assisted living facility brought to the hospital with abdominal pain, hypotension and altered mental status.  Patient does have underlying history of extensive mental health issues but mostly functional and communicating as per family.  According to the brother, patient was treated for UTI ,2 times last month and lately on Bactrim.  Patient also noted to be progressively weak and deconditioned for last month or so.  In the emergency room, WBC count 24.1, urinalysis not very impressive for infection.  Lithium level was therapeutic.  LFTs were normal.  Lactic acid was normal.  Found to have AKI.  Urinary retention more than 750 mL urine and Foley catheter was placed.  Admitted with leukocytosis, altered mental status with unknown cause. 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 4/11, iliac crest biopsy. 4/12-MRI brain with probable mets. Oncology notified.  Assessment & Plan:   Principal Problem:   Abdominal pain Active Problems:   UTI (urinary tract infection)   Sepsis (HCC)   Schizophrenia (HCC)   Bipolar disorder (HCC)   Depression   Anxiety   Hypertension   HLD (hyperlipidemia)   AKI (acute kidney injury) (HCC)   Abnormal CT scan   Acute metabolic encephalopathy   Bone metastases (HCC)   Lymphadenopathy  Abdominal pain and leukocytosis:  Patient was found to have metastatic lesion as below.  Urine culture negative.  Rocephin day 5 discontinue further.  Urinary retention resolved.  4/12- no labs today Will ck am labs   Altered mental status with history of multiple mental health issues including schizophrenia, bipolar, depression and  anxiety: Lithium level is therapeutic. Patient is on multiple other medications including Cogentin, Wellbutrin, Klonopin that we will continue.  Continue lithium. Metabolic encephalopathy could be from Bactrim or from polypharmacy during dehydration. CT head was normal.  Since found with malignancy, ordered MRI of brain this a.m. found with mets please see full report.   Hypertension: Stable  Holding ACE inhibitor due to AKI and hypotension, will continue to hold    Acute kidney injury: Likely due to urinary retention.  Resolved after Foley catheterization and IV fluids   Malignant lesion on the bone: On presentation a routine CT scan showed abnormal right iliac crest lesion.  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 CT scan of the chest abdomen pelvis with extensive lymphadenopathy both side of the diaphragm. CT-guided biopsy 4/11 with radiology. Oncology was consulted input was appreciated.   Recommended MRI of brain which revealed mets with edema.  Dr. Janese Banks was notified recommended Decadron 4 mg twice daily  We will check flow cytometry  We will discuss with oncology for any further management and treatment    Leukocytosis possibly from malignancy.  Could be reactive secondary to lithium.  Flow cytometry will be checked per oncology's recommendation  Updated patient's brother who is healthcare power of attorney.    DVT prophylaxis: enoxaparin (LOVENOX) injection 40 mg Start: 11/12/20 1000   Code Status: DNR-spoke to brother who is a healthcare power of attorney and they wish to change her to DNR Family Communication: Patient's brother on the phone. Disposition Plan: Status is: Inpatient  Remains inpatient appropriate because:Hemodynamically unstable and Altered  mental status   Dispo: The patient is from: ALF              Anticipated d/c is to: Skilled nursing facility for rehab.              Patient  currently is medically stable for discharge.  No   Difficult to place patient No         Consultants:   Oncology  Procedures:   Biopsy  Antimicrobials:   Rocephin, 4/8----4/11   Subjective: Patient remains confused.  Interactive.  NAD  Objective: Vitals:   11/11/20 1517 11/11/20 1947 11/11/20 2308 11/12/20 0643  BP: 117/60 (!) 140/113 96/85 120/61  Pulse: 72 98 85 80  Resp:  18 20 16   Temp: 97.8 F (36.6 C) 97.8 F (36.6 C) (!) 97.5 F (36.4 C) 97.7 F (36.5 C)  TempSrc:  Oral Axillary Oral  SpO2: 100% 97%  95%  Weight:      Height:        Intake/Output Summary (Last 24 hours) at 11/12/2020 1546 Last data filed at 11/12/2020 0648 Gross per 24 hour  Intake --  Output 1250 ml  Net -1250 ml   Filed Weights   11/08/20 0907  Weight: 49.9 kg    Examination:  NAD, calm interactive CTA no wheeze rales rhonchi's Regular S1-S2 no gallops Soft benign positive bowel sounds No edema Grossly intact, awake alert but not oriented Mood and affect appropriate in current setting.   Data Reviewed: I have personally reviewed following labs and imaging studies  CBC: Recent Labs  Lab 11/08/20 0910 11/09/20 0601 11/10/20 0422 11/11/20 0831  WBC 24.1* 21.3* 21.4* 24.1*  NEUTROABS 19.1*  --  16.4* 18.9*  HGB 10.3* 9.8* 10.3* 11.8*  HCT 31.8* 30.8* 32.5* 35.3*  MCV 95.5 98.1 98.2 94.9  PLT 291 250 304 559   Basic Metabolic Panel: Recent Labs  Lab 11/08/20 0910 11/09/20 0601 11/10/20 0422  NA 132* 138 140  K 4.2 4.7 3.7  CL 103 113* 112*  CO2 21* 20* 21*  GLUCOSE 108* 126* 100*  BUN 25* 13 14  CREATININE 1.57* 1.04* 0.96  CALCIUM 9.5 9.2 9.8  MG  --   --  2.2  PHOS  --   --  2.1*   GFR: Estimated Creatinine Clearance: 38 mL/min (by C-G formula based on SCr of 0.96 mg/dL). Liver Function Tests: Recent Labs  Lab 11/08/20 0910 11/10/20 0422  AST 18 16  ALT 11 11  ALKPHOS 97 93  BILITOT 0.6 0.4  PROT 6.2* 6.2*  ALBUMIN 2.7* 2.8*   No  results for input(s): LIPASE, AMYLASE in the last 168 hours. No results for input(s): AMMONIA in the last 168 hours. Coagulation Profile: Recent Labs  Lab 11/08/20 0910  INR 1.2   Cardiac Enzymes: No results for input(s): CKTOTAL, CKMB, CKMBINDEX, TROPONINI in the last 168 hours. BNP (last 3 results) No results for input(s): PROBNP in the last 8760 hours. HbA1C: No results for input(s): HGBA1C in the last 72 hours. CBG: No results for input(s): GLUCAP in the last 168 hours. Lipid Profile: No results for input(s): CHOL, HDL, LDLCALC, TRIG, CHOLHDL, LDLDIRECT in the last 72 hours. Thyroid Function Tests: No results for input(s): TSH, T4TOTAL, FREET4, T3FREE, THYROIDAB in the last 72 hours. Anemia Panel: Recent Labs    11/12/20 0525  VITAMINB12 205  FOLATE 3.2*  FERRITIN 145  TIBC 175*  IRON 30   Sepsis Labs: Recent Labs  Lab 11/08/20 0910  PROCALCITON <  0.10  LATICACIDVEN 1.9    Recent Results (from the past 240 hour(s))  Urine culture     Status: None   Collection Time: 11/08/20  9:13 AM   Specimen: In/Out Cath Urine  Result Value Ref Range Status   Specimen Description   Final    IN/OUT CATH URINE Performed at [facility], [address], [place], [place] [address]    Special Requests   Final    NONE Performed at [facility], [address], [place], [place] [address]    Culture   Final    NO GROWTH Performed at [facility], [place] [address], [place], [place] [address]    Report Status 11/09/2020 FINAL  Final  Resp Panel by RT-PCR (Flu A&B, Covid) Nasopharyngeal Swab     Status: None   Collection Time: 11/08/20 10:34 AM   Specimen: Nasopharyngeal Swab; Nasopharyngeal(NP) swabs in vial transport medium  Result Value Ref Range Status   SARS Coronavirus 2 by RT PCR NEGATIVE NEGATIVE Final    Comment: (NOTE) SARS-CoV-2 target nucleic acids are NOT DETECTED.  The SARS-CoV-2 RNA is generally detectable in upper respiratory specimens  during the acute phase of infection. The lowest concentration of SARS-CoV-2 viral copies this assay can detect is 138 copies/mL. A negative result does not preclude SARS-Cov-2 infection and should not be used as the sole basis for treatment or other patient management decisions. A negative result may occur with  improper specimen collection/handling, submission of specimen other than nasopharyngeal swab, presence of viral mutation(s) within the areas targeted by this assay, and inadequate number of viral copies(<138 copies/mL). A negative result must be combined with clinical observations, patient history, and epidemiological information. The expected result is Negative.  Fact Sheet for Patients:  EntrepreneurPulse.com.au  Fact Sheet for Healthcare Providers:  IncredibleEmployment.be  This test is no t yet approved or cleared by the [place] FDA and  has been authorized for detection and/or diagnosis of SARS-CoV-2 by FDA under an Emergency Use Authorization (EUA). This EUA will remain  in effect (meaning this test can be used) for the duration of the COVID-19 declaration under Section 564(b)(1) of the Act, 21 U.S.C.section 360bbb-3(b)(1), unless the authorization is terminated  or revoked sooner.       Influenza A by PCR NEGATIVE NEGATIVE Final   Influenza B by PCR NEGATIVE NEGATIVE Final    Comment: (NOTE) The Xpert Xpress SARS-CoV-2/FLU/RSV plus assay is intended as an aid in the diagnosis of influenza from Nasopharyngeal swab specimens and should not be used as a sole basis for treatment. Nasal washings and aspirates are unacceptable for Xpert Xpress SARS-CoV-2/FLU/RSV testing.  Fact Sheet for Patients: EntrepreneurPulse.com.au  Fact Sheet for Healthcare Providers: IncredibleEmployment.be  This test is not yet approved or cleared by the [place] FDA and has been authorized for detection  and/or diagnosis of SARS-CoV-2 by FDA under an Emergency Use Authorization (EUA). This EUA will remain in effect (meaning this test can be used) for the duration of the COVID-19 declaration under Section 564(b)(1) of the Act, 21 U.S.C. section 360bbb-3(b)(1), unless the authorization is terminated or revoked.  Performed at [facility], [address] [place], [place] [address]   CULTURE, BLOOD (ROUTINE X 2) w Reflex to ID Panel     Status: None (Preliminary result)   Collection Time: 11/08/20  1:20 PM   Specimen: BLOOD  Result Value Ref Range Status   Specimen Description BLOOD RIGHT ANTECUBITAL  Final   Special  Requests   Final    BOTTLES DRAWN AEROBIC AND ANAEROBIC Blood Culture adequate volume   Culture   Final    NO GROWTH 4 DAYS Performed at [facility], [address] [place], [place] [address]    Report Status PENDING  Incomplete  CULTURE, BLOOD (ROUTINE X 2) w Reflex to ID Panel     Status: None (Preliminary result)   Collection Time: 11/08/20  1:20 PM   Specimen: BLOOD  Result Value Ref Range Status   Specimen Description BLOOD LEFT ANTECUBITAL  Final   Special Requests   Final    BOTTLES DRAWN AEROBIC AND ANAEROBIC Blood Culture adequate volume   Culture   Final    NO GROWTH 4 DAYS Performed at [facility], [address], [place], [place] [address]    Report Status PENDING  Incomplete  MRSA PCR Screening     Status: None   Collection Time: 11/08/20  9:23 PM   Specimen: Nasal Mucosa; Nasopharyngeal  Result Value Ref Range Status   MRSA by PCR NEGATIVE NEGATIVE Final    Comment:        The GeneXpert MRSA Assay (FDA approved for NASAL specimens only), is one component of a comprehensive MRSA colonization surveillance program. It is not intended to diagnose MRSA infection nor to guide or monitor treatment for MRSA infections. Performed at [facility], [place], [place] [address]   SARS CORONAVIRUS 2  (TAT 6-24 HRS) Nasopharyngeal Nasopharyngeal Swab     Status: None   Collection Time: 11/11/20  5:57 PM   Specimen: Nasopharyngeal Swab  Result Value Ref Range Status   SARS Coronavirus 2 NEGATIVE NEGATIVE Final    Comment: (NOTE) SARS-CoV-2 target nucleic acids are NOT DETECTED.  The SARS-CoV-2 RNA is generally detectable in upper and lower respiratory specimens during the acute phase of infection. Negative results do not preclude SARS-CoV-2 infection, do not rule out co-infections with other pathogens, and should not be used as the sole basis for treatment or other patient management decisions. Negative results must be combined with clinical observations, patient history, and epidemiological information. The expected result is Negative.  Fact Sheet for Patients: SugarRoll.be  Fact Sheet for Healthcare Providers: https://www.woods-mathews.com/  This test is not yet approved or cleared by the [place] FDA and  has been authorized for detection and/or diagnosis of SARS-CoV-2 by FDA under an Emergency Use Authorization (EUA). This EUA will remain  in effect (meaning this test can be used) for the duration of the COVID-19 declaration under Se ction 564(b)(1) of the Act, 21 U.S.C. section 360bbb-3(b)(1), unless the authorization is terminated or revoked sooner.  Performed at [facility], [place] [address] [place], [place] [address]          Radiology Studies: CT GUIDED NEEDLE PLACEMENT  Result Date: 11/11/2020 INDICATION: No known primary, now with multiple of rest of osseous lesions worrisome for metastatic disease. Please perform CT-guided biopsy of permeative lesion involving the anterior aspect of the right ilium for tissue diagnostic purposes. Note, patient also found to have malignant-appearing retroperitoneal lymph nodes however given the size and location of the periaortic lymph nodes decision was made to initially  proceed with CT-guided bone lesion biopsy in hopes this biopsy will provided tissue diagnosis in this patient with history of schizophrenia and uncertain ability to tolerate and/or cooperate with a CT-guided biopsy. EXAM: CT-GUIDED BIOPSY INVOLVING THE ANTERIOR ASPECT OF THE RIGHT ILIUM. MEDICATIONS: None COMPARISON:  CT the chest,  abdomen and pelvis-earlier same day; pelvic MRI-11/09/2020 ANESTHESIA/SEDATION: Fentanyl 50 mcg IV; Versed 1 mg IV Sedation time: 18 minutes; The patient was continuously monitored during the procedure by the interventional radiology nurse under my direct supervision. COMPLICATIONS: None immediate. PROCEDURE: Informed consent was obtained from the patient's family following an explanation of the procedure, risks, benefits and alternatives. The patient understands, agrees and consents for the procedure. All questions were addressed. A time out was performed prior to the initiation of the procedure. The patient was positioned supine on the CT table and a limited CT was performed for procedural planning demonstrating a permeative lesion involving the anterior aspect of the right ilium. The procedure was planned. The operative site was prepped and draped in the usual sterile fashion. Appropriate trajectory was confirmed with a 22 gauge spinal needle after the adjacent tissues were anesthetized with 1% Lidocaine with epinephrine. Next, an 11 gauge coaxial bone biopsy needle was advanced into anterior aspect of the right ilium. The inner 13 gauge coil axial bone biopsy device was then utilized to acquire the initial sample after appropriate position was confirmed with CT imaging (image 11, series 3). Finally, the outer 11 gauge bone biopsy device was utilized to acquire an additional sample (image 15, series 3). The needle was removed and superficial hemostasis was obtained with manual compression. A dressing was applied. The patient tolerated the procedure well without immediate post  procedural complication. IMPRESSION: Successful CT guided biopsy of permanent lesion involving the anterior aspect of the right ilium. PLAN: As above, CT-guided bone lesion biopsy was initially pursued given patient's history of schizophrenia and uncertain ability to tolerate and/or cooperate with CT-guided biopsy however ultimately if additional tissue is required attempted CT-guided retroperitoneal lymph node biopsy could be performed as indicated. Electronically Signed   By: Sandi Mariscal M.D.   On: 11/11/2020 11:21   MR BRAIN W WO CONTRAST  Result Date: 11/12/2020 CLINICAL DATA:  Altered mental status. Recently discovered metastatic disease. EXAM: MRI HEAD WITHOUT AND WITH CONTRAST TECHNIQUE: Multiplanar, multiecho pulse sequences of the brain and surrounding structures were obtained without and with intravenous contrast. CONTRAST:  31mL GADAVIST GADOBUTROL 1 MMOL/ML IV SOLN COMPARISON:  Head CT 11/08/2020 FINDINGS: The study is motion degraded throughout including moderate to severe motion on postcontrast imaging. Brain: There are at least 11 small rounded foci of diffusion weighted signal abnormality involving cortex/gray-white junction predominantly posteriorly in the right greater than left cerebral hemispheres. The largest measures 1.5 cm in the high posterior right frontal lobe with mild surrounding edema. There is also evidence of mild edema surrounding a right temporal-occipital lesion. Assessment for enhancement is severely limited by motion artifact, however there is the suggestion of some enhancement of the dominant lesion in the right frontal lobe (series 20, image 10). No gross intracranial hemorrhage, midline shift, or extra-axial fluid collection is identified. There is moderate cerebral atrophy. Periventricular white matter T2 hyperintensities are nonspecific but compatible with minimal chronic small vessel ischemic disease. No definite lesions are identified in the posterior fossa. Vascular:  Major intracranial vascular flow voids are preserved. Skull and upper cervical spine: Abnormal marrow signal in the C2 vertebral body and posterior elements, dens, and posterior C3 vertebral body. Sinuses/Orbits: Bilateral cataract extraction. Trace right maxillary sinus mucosal thickening. Small bilateral mastoid effusions. Other: None. IMPRESSION: 1. Severely motion degraded examination. 2. Multiple small brain lesions with at most mild edema most consistent with metastatic disease. No mass effect. 3. Abnormal marrow signal in the upper cervical spine concerning  for metastatic disease. Electronically Signed   By: Logan Bores M.D.   On: 11/12/2020 13:00   CT CHEST ABDOMEN PELVIS W CONTRAST  Result Date: 11/11/2020 CLINICAL DATA:  Gastrointestinal cancer. Osseous metastatic disease. Initial staging examination EXAM: CT CHEST, ABDOMEN, AND PELVIS WITH CONTRAST TECHNIQUE: Multidetector CT imaging of the chest, abdomen and pelvis was performed following the standard protocol during bolus administration of intravenous contrast. CONTRAST:  128mL OMNIPAQUE IOHEXOL 300 MG/ML  SOLN COMPARISON:  Noncontrast CT abdomen pelvis 11/08/2020, FINDINGS: CT CHEST FINDINGS Cardiovascular: Extensive multi-vessel coronary artery calcification. Global cardiac size within normal limits. No pericardial effusion. The central pulmonary arteries are of normal caliber. Mild atherosclerotic calcification within the thoracic aorta. No aortic aneurysm. Mediastinum/Nodes: There is necrotic appearing left hilar and subcarinal adenopathy. By example, left hilar adenopathy measures 2.3 x 2.5 cm at axial image # 26/2. Subcarinal adenopathy measures 1.5 x 2.9 cm at axial image # 30/2. The esophagus is unremarkable. Lungs/Pleura: Mild centrilobular emphysema. Mild bibasilar atelectasis. No focal pulmonary nodules or infiltrates. No pneumothorax or pleural effusion. Left hilar adenopathy results in circumferential narrowing of the left lower lobar  pulmonary bronchi. Musculoskeletal: Pathologic appearing compression fracture of T6 with near vertebral plana deformity. Minimal retropulsion of the posteroinferior component of the T6 vertebral body resulting in minimal central canal stenosis. Mixed lytic and sclerotic pattern within the a T3 vertebral body may reflect the presence of underlying metastatic disease in this location. No definite cortical erosion. No associated pathologic fracture. CT ABDOMEN PELVIS FINDINGS Hepatobiliary: No focal liver abnormality is seen. No gallstones, gallbladder wall thickening, or biliary dilatation. Pancreas: Unremarkable Spleen: Unremarkable Adrenals/Urinary Tract: The adrenal glands are unremarkable. The kidneys are normal in size and position. Multiple cortical hypodensities are seen within the kidneys bilaterally which are too small to characterize. A a 17 mm cortical hypodensity, however, within the posterior interpolar region of the left kidney appears solid in nature and may represent a hypoenhancing mass, but is not optimally characterized on this exam. No hydronephrosis. No intrarenal or ureteral calculi. The bladder is unremarkable. Stomach/Bowel: There is polypoid fold thickening within the a mid body of the stomach, best seen on axial image # 53/2 and sagittal image # 122/6 and an underlying gastric mass is difficult to exclude. The stomach, small bowel, and large bowel are otherwise unremarkable save for moderate stool seen throughout the colon. No evidence of obstruction or focal inflammation. The appendix is not clearly identified and may be absent. No free intraperitoneal gas or fluid. Vascular/Lymphatic: Extensive aortoiliac atherosclerotic calcification. No aortic aneurysm. There is pathologic retroperitoneal adenopathy within the retrocrural, left periaortic, aortocaval, and retrocaval lymph node groups as well as the common iliac lymph node groups bilaterally. Index lymph node within the left periaortic  lymph node group measures 1.9 x 2.0 cm at axial image # 74. Reproductive: Uterus and bilateral adnexa are unremarkable. Other: No abdominal wall hernia.  Rectum unremarkable. Musculoskeletal: Mild sclerosis involving the right ilium and left pubic symphysis. No associated pathologic fracture. With associated periosteal reaction is again identified, better assessed on accompanying MRI examination. The majority of these lesions, however, appear radiographically occult on this exam IMPRESSION: Pathologic left hilar, subcarinal, retrocrural, and retroperitoneal lymphadenopathy. Left periaortic lymphadenopathy may provide a viable target for CT-guided biopsy for tissue sampling. Polypoid fold thickening involving the mid body of the stomach. An underlying primary gastric mass is difficult to exclude on this examination. This would be better assessed with endoscopy or upper gastrointestinal series. 17 mm hypoenhancing suspected  solid mass within the left kidney. Dedicated renal sonography may be helpful, as an initial step, for further evaluation. Multiple osseous lesions in keeping with osseous metastatic disease. The majority of these lesions within the pelvis noted on prior MRI examination are radiographically occult. T6 suspected pathologic fracture. Additional possible metastasis involving the T3 vertebral body. This could be confirmed with contrast enhanced MRI examination. Extensive multi-vessel coronary artery calcification. Mild centrilobular emphysema. Aortic Atherosclerosis (ICD10-I70.0) and Emphysema (ICD10-J43.9). Electronically Signed   By: Fidela Salisbury MD   On: 11/11/2020 02:30        Scheduled Meds: . acidophilus  1 capsule Oral Daily  . atorvastatin  20 mg Oral QPM  . benztropine  0.5 mg Oral BID  . buPROPion  150 mg Oral [id number]  . cholecalciferol  2,000 Units Oral q AM  . enoxaparin (LOVENOX) injection  40 mg Subcutaneous Q24H  . lithium carbonate  150 mg Oral BID WC  . loratadine  10 mg  Oral Daily  . traZODone  50 mg Oral QHS   Continuous Infusions: . sodium chloride Stopped (11/10/20 1133)     LOS: 4 days    Time spent: 35 minutes with >50 % on coc    Nolberto Hanlon, MD [facility] Pager [phone]

## 2020-11-12 NOTE — Consult Note (Signed)
[place]  [phone] [phone]   Name: Danielle Steele Date: 11/12/2020 MRN: [id number]  DOB: Mar 29, 1941  Patient Care Team: Ann Held as PCP - General (Physician Assistant)    REASON FOR CONSULTATION: Danielle Steele is a 80 y.o. female with multiple medical problems including schizophrenia, bipolar disorder, hypertension, hyperlipidemia, and depression, who was admitted to the hospital on 11/08/2020 with altered mental status.  Work-up including CT of the chest, abdomen, and pelvis revealed hilar/subcarinal/retroperitoneal adenopathy, pathologic T6 compression fracture, mixed lytic and sclerotic lesions, and left renal mass.  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  She underwent bone biopsy.  Palliative care was referred to address goals and manage ongoing symptoms.  SOCIAL HISTORY:     reports that she has quit smoking. She has never used smokeless tobacco. She reports that she does not drink alcohol and does not use drugs.  Patient is unmarried.  She has no children.  She is from [place] but moved here to be near her brother after he retired.   She has been an ALF for the past 5 years at [employer].  Patient has 4 other brothers who are less involved.  ADVANCE DIRECTIVES:  Not on file  CODE STATUS: DNR  PAST MEDICAL HISTORY: Past Medical History:  Diagnosis Date  . Anxiety   . Bipolar disorder ([place])   . Depression   . GERD (gastroesophageal reflux disease)   . Hypertension   . PND (post-nasal drip)    CAUSES SOME COUGH  . Pre-diabetes   . Schizophrenia ([place])    SCHIZO  AFFECTIVE DISORDER  . Thyroid nodule   . Tremors of nervous system     PAST SURGICAL HISTORY:  Past Surgical History:  Procedure Laterality Date  . CATARACT EXTRACTION W/PHACO Left 09/07/2017   Procedure: CATARACT EXTRACTION PHACO AND INTRAOCULAR LENS PLACEMENT (IOC);   Surgeon: Birder Robson, MD;  Location: [facility] ORS;  Service: Ophthalmology;  Laterality: Left;  [place] 00:39.9 AP% 17.4 CDE 6.94 Fluid Pack lot # [id number]  . CATARACT EXTRACTION W/PHACO Right 09/28/2017   Procedure: CATARACT EXTRACTION PHACO AND INTRAOCULAR LENS PLACEMENT (IOC);  Surgeon: Birder Robson, MD;  Location: [facility] ORS;  Service: Ophthalmology;  Laterality: Right;  [place] 01:31.8 AP% 13.7 CDE 12.55 Fluid Pack Lot # [id number]   . EYE SURGERY    . TONSILLECTOMY      HEMATOLOGY/ONCOLOGY HISTORY:  Oncology History   No history exists.    ALLERGIES:  is allergic to citrus.  MEDICATIONS:  Current Facility-Administered Medications  Medication Dose Route Frequency Provider Last Rate Last Admin  . 0.9 %  sodium chloride infusion   Intravenous PRN Barb Merino, MD   Stopped at 11/10/20 1133  . acetaminophen (TYLENOL) tablet 650 mg  650 mg Oral Q6H PRN Ivor Costa, MD   650 mg at 11/11/20 1441  . acidophilus (RISAQUAD) capsule 1 capsule  1 capsule Oral Daily Barb Merino, MD   1 capsule at 11/12/20 1047  . atorvastatin (LIPITOR) tablet 20 mg  20 mg Oral QPM Ivor Costa, MD   20 mg at 11/11/20 2156  . benztropine (COGENTIN) tablet 0.5 mg  0.5 mg Oral BID Ivor Costa, MD   0.5 mg at 11/12/20 1048  . buPROPion (WELLBUTRIN XL) 24 hr tablet 150 mg  150 mg Oral Jaye Beagle, Soledad Gerlach, MD   150 mg at 11/12/20 1047  . cholecalciferol (VITAMIN D) tablet 2,000 Units  2,000  Units Oral q AM Ivor Costa, MD   2,000 Units at 11/12/20 1048  . clonazePAM (KLONOPIN) tablet 0.5 mg  0.5 mg Oral BID PRN Barb Merino, MD      . dexamethasone (DECADRON) tablet 4 mg  4 mg Oral Q12H Amery, Sahar, MD      . enoxaparin (LOVENOX) injection 40 mg  40 mg Subcutaneous Q24H Lu Duffel, RPH   40 mg at 11/12/20 1051  . guaiFENesin (ROBITUSSIN) 100 MG/5ML solution 300 mg  300 mg Oral Q6H PRN Ivor Costa, MD      . lithium carbonate capsule 150 mg  150 mg Oral BID WC Ivor Costa, MD   150 mg at 11/12/20 1049  .  loperamide (IMODIUM) capsule 4 mg  4 mg Oral PRN Ivor Costa, MD      . loratadine (CLARITIN) tablet 10 mg  10 mg Oral Daily Ivor Costa, MD   10 mg at 11/12/20 1047  . magnesium hydroxide (MILK OF MAGNESIA) suspension 30 mL  30 mL Oral Daily PRN Ivor Costa, MD   30 mL at 11/11/20 1125  . morphine 2 MG/ML injection 0.5 mg  0.5 mg Intravenous Q4H PRN Ivor Costa, MD      . ondansetron [facility]) injection 4 mg  4 mg Intravenous Q8H PRN Ivor Costa, MD      . traZODone (DESYREL) tablet 50 mg  50 mg Oral QHS Ivor Costa, MD   50 mg at 11/11/20 2156    VITAL SIGNS: BP 120/61 (BP Location: Left Arm)   Pulse 80   Temp 97.7 F (36.5 C) (Oral)   Resp 16   Ht _0  (1.651 m)   Wt 110 lb (49.9 kg)   SpO2 95%   BMI 18.30 kg/m  Filed Weights   11/08/20 0907  Weight: 110 lb (49.9 kg)    Estimated body mass index is 18.3 kg/m as calculated from the following:   Height as of this encounter: _1  (1.651 m).   Weight as of this encounter: 110 lb (49.9 kg).  LABS: CBC:    Component Value Date/Time   WBC 24.1 (H) 11/11/2020 0831   HGB 11.8 (L) 11/11/2020 0831   HCT 35.3 (L) 11/11/2020 0831   PLT 318 11/11/2020 0831   MCV 94.9 11/11/2020 0831   NEUTROABS 18.9 (H) 11/11/2020 0831   LYMPHSABS 1.1 11/11/2020 0831   MONOABS 1.9 (H) 11/11/2020 0831   EOSABS 1.7 (H) 11/11/2020 0831   BASOSABS 0.1 11/11/2020 0831   Comprehensive Metabolic Panel:    Component Value Date/Time   NA 140 11/10/2020 0422   K 3.7 11/10/2020 0422   CL 112 (H) 11/10/2020 0422   CO2 21 (L) 11/10/2020 0422   BUN 14 11/10/2020 0422   CREATININE 0.96 11/10/2020 0422   GLUCOSE 100 (H) 11/10/2020 0422   CALCIUM 9.8 11/10/2020 0422   AST 16 11/10/2020 0422   ALT 11 11/10/2020 0422   ALKPHOS 93 11/10/2020 0422   BILITOT 0.4 11/10/2020 0422   PROT 6.2 (L) 11/10/2020 0422   ALBUMIN 2.8 (L) 11/10/2020 0422    RADIOGRAPHIC STUDIES: CT ABDOMEN PELVIS WO CONTRAST  Result Date: 11/08/2020 CLINICAL DATA:  Abdominal pain and  fever EXAM: CT ABDOMEN AND PELVIS WITHOUT CONTRAST TECHNIQUE: Multidetector CT imaging of the abdomen and pelvis was performed following the standard protocol without oral or IV contrast. COMPARISON:  None. FINDINGS: Lower chest: There is bibasilar atelectatic change. No consolidation in the lung bases. There are foci of coronary artery calcification.  Hepatobiliary: No focal liver lesions are appreciable. The gallbladder appears distended. Gallbladder wall does not appear appreciably thickened by CT. No biliary duct dilatation evident. Pancreas: There is no pancreatic mass or inflammatory focus. Spleen: No splenic lesions are evident. Adrenals/Urinary Tract: There is mild adrenal hypertrophy bilaterally. No focal adrenal lesions evident. There is no appreciable renal mass or hydronephrosis on either side. There is no evident renal or ureteral calculus on either side. The urinary bladder appears distended without wall thickening. Stomach/Bowel: There is no appreciable bowel wall or mesenteric thickening. There is moderate stool and air in the colon. There is no evident bowel obstruction. Terminal ileum appears unremarkable. Appendix appears normal. No evident free air or portal venous air. Vascular/Lymphatic: There is no abdominal aortic aneurysm. There is extensive aortic and iliac artery atherosclerotic calcification. No adenopathy is evident in the abdomen or pelvis. Reproductive: Uterus is mildly canted to the left. No adnexal masses are evident. Other: No evident abscess or ascites in the abdomen or pelvis. Musculoskeletal: Relative sclerosis noted in the right iliac bone in a portion of the right acetabulum with mildly irregular periosteum, likely due to Paget's disease. No lytic or destructive lesions are evident. There is degenerative change in each pubic symphysis. No intramuscular lesions evident. IMPRESSION: 1. 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 Note that there is  mildly irregular periosteum in this area. An atypical bony neoplasm could potentially present in this manner. Given this appearance of the periosteum in this area, correlation with MR of the right iliac crest and acetabulum is advised to further evaluate. 2. Gallbladder is mildly distended without overt wall thickening by CT. No biliary duct dilatation. Correlation with ultrasound of the gallbladder may be advisable in this circumstance. 3. Urinary bladder distended without wall thickening. No renal or ureteral calculus. No hydronephrosis on either side. 4. No evident bowel obstruction. No abscess in the abdomen or pelvis. Appendix region appears normal. 5. Aortic Atherosclerosis (ICD10-I70.0). Foci of coronary artery and iliac artery atherosclerotic calcification noted. 6. Mild adrenal hypertrophy bilaterally without focal adrenal lesion. Significance of mild adrenal hypertrophy is uncertain in this age group. Electronically Signed   By: Lowella Grip III M.D.   On: 11/08/2020 10:30   CT HEAD WO CONTRAST  Result Date: 11/08/2020 CLINICAL DATA:  Mental status change EXAM: CT HEAD WITHOUT CONTRAST TECHNIQUE: Contiguous axial images were obtained from the base of the skull through the vertex without intravenous contrast. COMPARISON:  October 10, 2020 FINDINGS: Brain: No evidence of acute territorial infarction, hemorrhage, hydrocephalus,extra-axial collection or mass lesion/mass effect. There is dilatation the ventricles and sulci consistent with age-related atrophy. Low-attenuation changes in the deep white matter consistent with small vessel ischemia. Vascular: No hyperdense vessel or unexpected calcification. Skull: The skull is intact. No fracture or focal lesion identified. Sinuses/Orbits: The visualized paranasal sinuses and mastoid air cells are clear. The orbits and globes intact. Other: None IMPRESSION: No acute intracranial abnormality. Findings consistent with age related atrophy and chronic small  vessel ischemia Electronically Signed   By: Prudencio Pair M.D.   On: 11/08/2020 15:58   CT GUIDED NEEDLE PLACEMENT  Result Date: 11/11/2020 INDICATION: No known primary, now with multiple of rest of osseous lesions worrisome for metastatic disease. Please perform CT-guided biopsy of permeative lesion involving the anterior aspect of the right ilium for tissue diagnostic purposes. Note, patient also found to have malignant-appearing retroperitoneal lymph nodes however given the size and location of the periaortic lymph nodes decision was made  to initially proceed with CT-guided bone lesion biopsy in hopes this biopsy will provided tissue diagnosis in this patient with history of schizophrenia and uncertain ability to tolerate and/or cooperate with a CT-guided biopsy. EXAM: CT-GUIDED BIOPSY INVOLVING THE ANTERIOR ASPECT OF THE RIGHT ILIUM. MEDICATIONS: None COMPARISON:  CT the chest, abdomen and pelvis-earlier same day; pelvic MRI-11/09/2020 ANESTHESIA/SEDATION: Fentanyl 50 mcg IV; Versed 1 mg IV Sedation time: 18 minutes; The patient was continuously monitored during the procedure by the interventional radiology nurse under my direct supervision. COMPLICATIONS: None immediate. PROCEDURE: Informed consent was obtained from the patient's family following an explanation of the procedure, risks, benefits and alternatives. The patient understands, agrees and consents for the procedure. All questions were addressed. A time out was performed prior to the initiation of the procedure. The patient was positioned supine on the CT table and a limited CT was performed for procedural planning demonstrating a permeative lesion involving the anterior aspect of the right ilium. The procedure was planned. The operative site was prepped and draped in the usual sterile fashion. Appropriate trajectory was confirmed with a 22 gauge spinal needle after the adjacent tissues were anesthetized with 1% Lidocaine with epinephrine. Next, an  11 gauge coaxial bone biopsy needle was advanced into anterior aspect of the right ilium. The inner 13 gauge coil axial bone biopsy device was then utilized to acquire the initial sample after appropriate position was confirmed with CT imaging (image 11, series 3). Finally, the outer 11 gauge bone biopsy device was utilized to acquire an additional sample (image 15, series 3). The needle was removed and superficial hemostasis was obtained with manual compression. A dressing was applied. The patient tolerated the procedure well without immediate post procedural complication. IMPRESSION: Successful CT guided biopsy of permanent lesion involving the anterior aspect of the right ilium. PLAN: As above, CT-guided bone lesion biopsy was initially pursued given patient's history of schizophrenia and uncertain ability to tolerate and/or cooperate with CT-guided biopsy however ultimately if additional tissue is required attempted CT-guided retroperitoneal lymph node biopsy could be performed as indicated. Electronically Signed   By: Sandi Mariscal M.D.   On: 11/11/2020 11:21   MR BRAIN W WO CONTRAST  Result Date: 11/12/2020 CLINICAL DATA:  Altered mental status. Recently discovered metastatic disease. EXAM: MRI HEAD WITHOUT AND WITH CONTRAST TECHNIQUE: Multiplanar, multiecho pulse sequences of the brain and surrounding structures were obtained without and with intravenous contrast. CONTRAST:  77m GADAVIST GADOBUTROL 1 MMOL/ML IV SOLN COMPARISON:  Head CT 11/08/2020 FINDINGS: The study is motion degraded throughout including moderate to severe motion on postcontrast imaging. Brain: There are at least 11 small rounded foci of diffusion weighted signal abnormality involving cortex/gray-white junction predominantly posteriorly in the right greater than left cerebral hemispheres. The largest measures 1.5 cm in the high posterior right frontal lobe with mild surrounding edema. There is also evidence of mild edema surrounding a  right temporal-occipital lesion. Assessment for enhancement is severely limited by motion artifact, however there is the suggestion of some enhancement of the dominant lesion in the right frontal lobe (series 20, image 10). No gross intracranial hemorrhage, midline shift, or extra-axial fluid collection is identified. There is moderate cerebral atrophy. Periventricular white matter T2 hyperintensities are nonspecific but compatible with minimal chronic small vessel ischemic disease. No definite lesions are identified in the posterior fossa. Vascular: Major intracranial vascular flow voids are preserved. Skull and upper cervical spine: Abnormal marrow signal in the C2 vertebral body and posterior elements, dens, and posterior C3  vertebral body. Sinuses/Orbits: Bilateral cataract extraction. Trace right maxillary sinus mucosal thickening. Small bilateral mastoid effusions. Other: None. IMPRESSION: 1. Severely motion degraded examination. 2. Multiple small brain lesions with at most mild edema most consistent with metastatic disease. No mass effect. 3. Abnormal marrow signal in the upper cervical spine concerning for metastatic disease. Electronically Signed   By: Logan Bores M.D.   On: 11/12/2020 13:00   MR PELVIS W WO CONTRAST  Result Date: 11/10/2020 CLINICAL DATA:  Right pelvic bone lesion on CT. EXAM: MRI PELVIS WITHOUT AND WITH CONTRAST TECHNIQUE: Multiplanar multisequence MR imaging of the pelvis was performed both before and after administration of intravenous contrast. CONTRAST:  68m GADAVIST GADOBUTROL 1 MMOL/ML IV SOLN COMPARISON:  CT abdomen pelvis dated November 08, 2020. FINDINGS: Musculoskeletal: Large marrow replacing lesion within the right iliac bone extending into the acetabulum with associated periosteal reaction and surrounding reactive edema in the gluteus and iliacus muscles. Additional marrow replacing lesions in the left and right sacral ala (series 11, images 13 and 15) and S1 vertebral body  (series 11, image 11. No definite soft tissue mass. No acute fracture or dislocation. Urinary Tract:  Foley catheter within the decompressed bladder. Bowel:  Unremarkable visualized pelvic bowel loops. Vascular/Lymphatic: No pathologically enlarged lymph nodes. No significant vascular abnormality seen. Reproductive:  No mass or other significant abnormality. Other:  None. IMPRESSION: 1. Multiple aggressive appearing marrow replacing lesions in the pelvis, the largest involving the right iliac bone and acetabulum. 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 Electronically Signed   By: WTitus DubinM.D.   On: 11/10/2020 12:59   CT CHEST ABDOMEN PELVIS W CONTRAST  Result Date: 11/11/2020 CLINICAL DATA:  Gastrointestinal cancer. Osseous metastatic disease. Initial staging examination EXAM: CT CHEST, ABDOMEN, AND PELVIS WITH CONTRAST TECHNIQUE: Multidetector CT imaging of the chest, abdomen and pelvis was performed following the standard protocol during bolus administration of intravenous contrast. CONTRAST:  1070mOMNIPAQUE IOHEXOL 300 MG/ML  SOLN COMPARISON:  Noncontrast CT abdomen pelvis 11/08/2020, FINDINGS: CT CHEST FINDINGS Cardiovascular: Extensive multi-vessel coronary artery calcification. Global cardiac size within normal limits. No pericardial effusion. The central pulmonary arteries are of normal caliber. Mild atherosclerotic calcification within the thoracic aorta. No aortic aneurysm. Mediastinum/Nodes: There is necrotic appearing left hilar and subcarinal adenopathy. By example, left hilar adenopathy measures 2.3 x 2.5 cm at axial image # 26/2. Subcarinal adenopathy measures 1.5 x 2.9 cm at axial image # 30/2. The esophagus is unremarkable. Lungs/Pleura: Mild centrilobular emphysema. Mild bibasilar atelectasis. No focal pulmonary nodules or infiltrates. No pneumothorax or pleural effusion. Left hilar adenopathy results in circumferential narrowing of the left lower lobar pulmonary bronchi.  Musculoskeletal: Pathologic appearing compression fracture of T6 with near vertebral plana deformity. Minimal retropulsion of the posteroinferior component of the T6 vertebral body resulting in minimal central canal stenosis. Mixed lytic and sclerotic pattern within the a T3 vertebral body may reflect the presence of underlying metastatic disease in this location. No definite cortical erosion. No associated pathologic fracture. CT ABDOMEN PELVIS FINDINGS Hepatobiliary: No focal liver abnormality is seen. No gallstones, gallbladder wall thickening, or biliary dilatation. Pancreas: Unremarkable Spleen: Unremarkable Adrenals/Urinary Tract: The adrenal glands are unremarkable. The kidneys are normal in size and position. Multiple cortical hypodensities are seen within the kidneys bilaterally which are too small to characterize. A a 17 mm cortical hypodensity, however, within the posterior interpolar region of the left kidney appears solid in nature and may represent a hypoenhancing mass, but is not optimally characterized on this  exam. No hydronephrosis. No intrarenal or ureteral calculi. The bladder is unremarkable. Stomach/Bowel: There is polypoid fold thickening within the a mid body of the stomach, best seen on axial image # 53/2 and sagittal image # 122/6 and an underlying gastric mass is difficult to exclude. The stomach, small bowel, and large bowel are otherwise unremarkable save for moderate stool seen throughout the colon. No evidence of obstruction or focal inflammation. The appendix is not clearly identified and may be absent. No free intraperitoneal gas or fluid. Vascular/Lymphatic: Extensive aortoiliac atherosclerotic calcification. No aortic aneurysm. There is pathologic retroperitoneal adenopathy within the retrocrural, left periaortic, aortocaval, and retrocaval lymph node groups as well as the common iliac lymph node groups bilaterally. Index lymph node within the left periaortic lymph node group  measures 1.9 x 2.0 cm at axial image # 74. Reproductive: Uterus and bilateral adnexa are unremarkable. Other: No abdominal wall hernia.  Rectum unremarkable. Musculoskeletal: Mild sclerosis involving the right ilium and left pubic symphysis. No associated pathologic fracture. With associated periosteal reaction is again identified, better assessed on accompanying MRI examination. The majority of these lesions, however, appear radiographically occult on this exam IMPRESSION: Pathologic left hilar, subcarinal, retrocrural, and retroperitoneal lymphadenopathy. Left periaortic lymphadenopathy may provide a viable target for CT-guided biopsy for tissue sampling. Polypoid fold thickening involving the mid body of the stomach. An underlying primary gastric mass is difficult to exclude on this examination. This would be better assessed with endoscopy or upper gastrointestinal series. 17 mm hypoenhancing suspected solid mass within the left kidney. Dedicated renal sonography may be helpful, as an initial step, for further evaluation. Multiple osseous lesions in keeping with osseous metastatic disease. The majority of these lesions within the pelvis noted on prior MRI examination are radiographically occult. T6 suspected pathologic fracture. Additional possible metastasis involving the T3 vertebral body. This could be confirmed with contrast enhanced MRI examination. Extensive multi-vessel coronary artery calcification. Mild centrilobular emphysema. Aortic Atherosclerosis (ICD10-I70.0) and Emphysema (ICD10-J43.9). Electronically Signed   By: Fidela Salisbury MD   On: 11/11/2020 02:30   DG Chest Port 1 View  Result Date: 11/08/2020 CLINICAL DATA:  Questionable sepsis EXAM: PORTABLE CHEST 1 VIEW COMPARISON:  None. FINDINGS: Mild coarsening of lung markings. No focal pneumonia. No edema, effusion, or pneumothorax. Normal heart size and mediastinal contours. IMPRESSION: 1. No focal pneumonia. 2. Coarsened lung markings which  could be chronic lung disease or atypical infection. Electronically Signed   By: Monte Fantasia M.D.   On: 11/08/2020 09:46   US ABDOMEN LIMITED RUQ (LIVER/GB)  Result Date: 11/08/2020 CLINICAL DATA:  Right upper quadrant pain EXAM: ULTRASOUND ABDOMEN LIMITED RIGHT UPPER QUADRANT COMPARISON:  None. FINDINGS: Gallbladder: No gallstones or wall thickening visualized. No sonographic Murphy sign noted by sonographer. Common bile duct: Diameter: 7.5 mm, upper normal Liver: No focal lesion identified. Within normal limits in parenchymal echogenicity. Portal vein is patent on color Doppler imaging with normal direction of blood flow towards the liver. Other: None. IMPRESSION: Negative for gallstones. Common bile duct upper normal. No liver lesion. Electronically Signed   By: Franchot Gallo M.D.   On: 11/08/2020 12:45    PERFORMANCE STATUS (ECOG) : 4 - Bedbound  Review of Systems Unable to complete  Physical Exam General: NAD Pulmonary: Unlabored Extremities: no edema, no joint deformities Skin: no rashes Neurological: Weakness, confusion  IMPRESSION: I met with patient and brother.  Patient's mental status is essentially back to baseline per brother.  However, she remains confused and tangential.  He says  that she is often confused and thinks that she is married.  Patient does not appear to situationally understand her current medical problems.  It seems unlikely that she has capacity for decision-making given her psychiatric history.  Her brother says that family are trying to limit what she is told regarding the probable cancer diagnosis as to limit her distress.  I met privately with patient's brother.  Together, we discussed the results of the MRI of the brain.  Pathology is pending.  Brother recognizes that work-up appears consistent with an advanced malignancy.  We discussed the potential nature of cancer treatment versus a focus on comfort and quality of life.  Husband is interested in speaking  with medical oncology following final pathology to have a better idea of available treatment options prior to making any final decisions.  Discussed with Dr. Janese Banks.  Brother says that patient's performance status has been declining over the past several weeks.  She is now essentially wheelchair-bound.  Plan is for rehab at [facility]. I will consult palliative care at the facility.   We discussed CODE STATUS.  Patient was a partial code with family consenting for CPR if needed.  I discussed the probable futility of CPR in the setting of what is most likely to be an incurable malignancy.  Brother plans to speak with other members of the family about CODE STATUS.  PLAN: -Continue current scope of treatment -Recommend dexamethasone 4 mg p.o. twice daily. -Probable dispo: SNF with palliative care following -Family considering DNR -Will plan follow up in the clinic after discharge from the hospital  Case and plan discussed with Dr. Janese Banks   Patient expressed understanding and was in agreement with this plan. She also understands that She can call the clinic at any time with any questions, concerns, or complaints.     Time Total: 60 minutes  Visit consisted of counseling and education dealing with the complex and emotionally intense issues of symptom management and palliative care in the setting of serious and potentially life-threatening illness.Greater than 50%  of this time was spent counseling and coordinating care related to the above assessment and plan.  Signed by: Altha Harm, PhD, NP-C

## 2020-11-12 NOTE — TOC Progression Note (Signed)
Transition of Care [facility]) - Progression Note    Patient Details  Name: Danielle Steele MRN: [id number] Date of Birth: [birth year]  Transition of Care [facility]) CM/SW Contact  Beverly Sessions, RN Phone Number: 11/12/2020, 1:58 PM  Clinical Narrative:    Per MD anticipated DC to SNF 1-2 days.  Lavella Lemons at [facility]   Expected Discharge Plan: [place] Barriers to Discharge: Continued Medical Work up  Expected Discharge Plan and Services Expected Discharge Plan: [place] arrangements for the past 2 months: [place]                                       Social Determinants of Health (SDOH) Interventions    Readmission Risk Interventions No flowsheet data found.

## 2020-11-13 ENCOUNTER — Ambulatory Visit: Payer: Medicare Other | Admitting: Radiation Oncology

## 2020-11-13 ENCOUNTER — Other Ambulatory Visit: Payer: Self-pay | Admitting: Anatomic Pathology & Clinical Pathology

## 2020-11-13 DIAGNOSIS — C7931 Secondary malignant neoplasm of brain: Secondary | ICD-10-CM

## 2020-11-13 DIAGNOSIS — R1011 Right upper quadrant pain: Secondary | ICD-10-CM | POA: Diagnosis not present

## 2020-11-13 DIAGNOSIS — N179 Acute kidney failure, unspecified: Secondary | ICD-10-CM | POA: Diagnosis not present

## 2020-11-13 DIAGNOSIS — G9341 Metabolic encephalopathy: Secondary | ICD-10-CM | POA: Diagnosis not present

## 2020-11-13 DIAGNOSIS — Z7189 Other specified counseling: Secondary | ICD-10-CM

## 2020-11-13 DIAGNOSIS — R9389 Abnormal findings on diagnostic imaging of other specified body structures: Secondary | ICD-10-CM | POA: Diagnosis not present

## 2020-11-13 LAB — CULTURE, BLOOD (ROUTINE X 2)
Culture: NO GROWTH
Culture: NO GROWTH
Special Requests: ADEQUATE
Special Requests: ADEQUATE

## 2020-11-13 LAB — COMP PANEL: LEUKEMIA/LYMPHOMA

## 2020-11-13 LAB — CBC
HCT: 35.9 % — ABNORMAL LOW (ref 36.0–46.0)
Hemoglobin: 11.9 g/dL — ABNORMAL LOW (ref 12.0–15.0)
MCH: 31.6 pg (ref 26.0–34.0)
MCHC: 33.1 g/dL (ref 30.0–36.0)
MCV: 95.2 fL (ref 80.0–100.0)
Platelets: 326 10*3/uL (ref 150–400)
RBC: 3.77 MIL/uL — ABNORMAL LOW (ref 3.87–5.11)
RDW: 13.5 % (ref 11.5–15.5)
WBC: 22.2 10*3/uL — ABNORMAL HIGH (ref 4.0–10.5)
nRBC: 0 % (ref 0.0–0.2)

## 2020-11-13 LAB — SURGICAL PATHOLOGY

## 2020-11-13 NOTE — Consult Note (Signed)
NEW PATIENT EVALUATION  Name: Danielle Steele  MRN: [id number]  Date:   11/08/2020     DOB: Mar 18, 1941   This 80 y.o. 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  REFERRING PHYSICIAN: No ref. provider found  CHIEF COMPLAINT:  Chief Complaint  Patient presents with  . Abdominal Pain  . Fever    DIAGNOSIS: The primary encounter diagnosis was AKI (acute kidney injury) ([place]). Diagnoses of RUQ abdominal pain, Hematuria, unspecified type, Tumor cells, malignant ([place]), and Bone metastases ([place]) were also pertinent to this visit.   PREVIOUS INVESTIGATIONS:  MRI scan CT scans reviewed Pathology report pending Clinical notes reviewed  HPI: Patient is a 80 year old female with history of significant for schizophrenia bipolar disorder.  She presented with altered mental status CT scan of abdomen pelvis with contrast showed left hilar subcarinal adenopathy a compression fracture of T6 and mixed lytic and sclerotic pattern within the T3 vertebral body.  Patient went CT-guided biopsy of the anterior aspect of the right ilium.  Pathology is pending.  MRI of the brain showed multiple small brain lesions compatible with metastatic disease.  No mass-effect.  Patient has been started on Decadron.  I been asked to evaluate her for possible palliative radiation therapy.  She is seen today in her hospital bed accompanied by her son.  She is a poor historian.  She states she is in no pain at this point time she is having some headaches though.  PLANNED TREATMENT REGIMEN: Whole brain radiation  PAST MEDICAL HISTORY:  has a past medical history of Anxiety, Bipolar disorder ([place]), Depression, GERD (gastroesophageal reflux disease), Hypertension, PND (post-nasal drip), Pre-diabetes, Schizophrenia ([place]), Thyroid nodule, and Tremors of nervous system.    PAST SURGICAL HISTORY:  Past Surgical History:  Procedure Laterality Date  .  CATARACT EXTRACTION W/PHACO Left 09/07/2017   Procedure: CATARACT EXTRACTION PHACO AND INTRAOCULAR LENS PLACEMENT (IOC);  Surgeon: Birder Robson, MD;  Location: [facility] ORS;  Service: Ophthalmology;  Laterality: Left;  [place] 00:39.9 AP% 17.4 CDE 6.94 Fluid Pack lot # [id number]  . CATARACT EXTRACTION W/PHACO Right 09/28/2017   Procedure: CATARACT EXTRACTION PHACO AND INTRAOCULAR LENS PLACEMENT (IOC);  Surgeon: Birder Robson, MD;  Location: [facility] ORS;  Service: Ophthalmology;  Laterality: Right;  [place] 01:31.8 AP% 13.7 CDE 12.55 Fluid Pack Lot # [id number]   . EYE SURGERY    . TONSILLECTOMY      FAMILY HISTORY: family history is not on file.  SOCIAL HISTORY:  reports that she has quit smoking. She has never used smokeless tobacco. She reports that she does not drink alcohol and does not use drugs.  ALLERGIES: [place]  MEDICATIONS:  Current Facility-Administered Medications  Medication Dose Route Frequency Provider Last Rate Last Admin  . 0.9 %  sodium chloride infusion   Intravenous PRN Barb Merino, MD   Stopped at 11/10/20 1133  . acetaminophen (TYLENOL) tablet 650 mg  650 mg Oral Q6H PRN Ivor Costa, MD   650 mg at 11/11/20 1441  . acidophilus (RISAQUAD) capsule 1 capsule  1 capsule Oral Daily Barb Merino, MD   1 capsule at 11/13/20 0803  . atorvastatin (LIPITOR) tablet 20 mg  20 mg Oral QPM Ivor Costa, MD   20 mg at 11/12/20 1756  . benztropine (COGENTIN) tablet 0.5 mg  0.5 mg Oral BID Ivor Costa, MD   0.5 mg at 11/13/20 0803  . buPROPion (WELLBUTRIN XL) 24 hr tablet 150 mg  150 mg  Oral Gaye Alken, MD   150 mg at 11/13/20 8250  . cholecalciferol (VITAMIN D) tablet 2,000 Units  2,000 Units Oral q AM Ivor Costa, MD   2,000 Units at 11/13/20 0804  . clonazePAM (KLONOPIN) tablet 0.5 mg  0.5 mg Oral BID PRN Barb Merino, MD      . dexamethasone (DECADRON) tablet 4 mg  4 mg Oral Q12H Borders, Kirt Boys, NP   4 mg at 11/13/20 0804  . enoxaparin (LOVENOX) injection 40 mg  40 mg  Subcutaneous Q24H Lu Duffel, RPH   40 mg at 11/13/20 0805  . guaiFENesin (ROBITUSSIN) 100 MG/5ML solution 300 mg  300 mg Oral Q6H PRN Ivor Costa, MD      . lithium carbonate capsule 150 mg  150 mg Oral BID WC Ivor Costa, MD   150 mg at 11/13/20 0805  . loperamide (IMODIUM) capsule 4 mg  4 mg Oral PRN Ivor Costa, MD      . loratadine (CLARITIN) tablet 10 mg  10 mg Oral Daily Ivor Costa, MD   10 mg at 11/13/20 0802  . magnesium hydroxide (MILK OF MAGNESIA) suspension 30 mL  30 mL Oral Daily PRN Ivor Costa, MD   30 mL at 11/12/20 2018  . morphine 2 MG/ML injection 0.5 mg  0.5 mg Intravenous Q4H PRN Ivor Costa, MD      . ondansetron [facility]) injection 4 mg  4 mg Intravenous Q8H PRN Ivor Costa, MD      . traZODone (DESYREL) tablet 50 mg  50 mg Oral QHS Ivor Costa, MD   50 mg at 11/12/20 2018    ECOG PERFORMANCE STATUS:  1 - Symptomatic but completely ambulatory  REVIEW OF SYSTEMS: Patient has bipolar disorder, chronic schizophrenia bipolar disorder hypertension hyperlipidemia depression. Patient denies any weight loss, fatigue, weakness, fever, chills or night sweats. Patient denies any loss of vision, blurred vision. Patient denies any ringing  of the ears or hearing loss. No irregular heartbeat. Patient denies heart murmur or history of fainting. Patient denies any chest pain or pain radiating to her upper extremities. Patient denies any shortness of breath, difficulty breathing at night, cough or hemoptysis. Patient denies any swelling in the lower legs. Patient denies any nausea vomiting, vomiting of blood, or coffee ground material in the vomitus. Patient denies any stomach pain. Patient states has had normal bowel movements no significant constipation or diarrhea. Patient denies any dysuria, hematuria or significant nocturia. Patient denies any problems walking, swelling in the joints or loss of balance. Patient denies any skin changes, loss of hair or loss of weight. Patient denies any  excessive worrying or anxiety or significant depression. Patient denies any problems with insomnia. Patient denies excessive thirst, polyuria, polydipsia. Patient denies any swollen glands, patient denies easy bruising or easy bleeding. Patient denies any recent infections, allergies or URI. Patient "s visual fields have not changed significantly in recent time.   PHYSICAL EXAM: BP 121/61   Pulse 76   Temp 97.8 F (36.6 C) (Oral)   Resp 18   Ht 5\' 5"  (1.651 m)   Wt 110 lb (49.9 kg)   SpO2 95%   BMI 18.30 kg/m  Frail thin appearing female seen in her hospital bed in NAD.  Crude visual fields appear within normal range motor or sensory levels are equal and symmetric upper and lower extremities.  Well-developed well-nourished patient in NAD. HEENT reveals PERLA, EOMI, discs not visualized.  Oral cavity is clear. No oral mucosal lesions are  identified. Neck is clear without evidence of cervical or supraclavicular adenopathy. Lungs are clear to A&P. Cardiac examination is essentially unremarkable with regular rate and rhythm without murmur rub or thrill. Abdomen is benign with no organomegaly or masses noted. Motor sensory and DTR levels are equal and symmetric in the upper and lower extremities. Cranial nerves II through XII are grossly intact. Proprioception is intact. No peripheral adenopathy or edema is identified. No motor or sensory levels are noted. Crude visual fields are within normal range.  LABORATORY DATA: Pathology is pending although this is probable primary lung cancer with brain metastasis    RADIOLOGY RESULTS: PET/CT CT scans and MRI brain reviewed and compatible with above-stated findings   IMPRESSION: Stage IV probable lung cancer with brain metastasis in 80 year old female with history of chronic schizophrenia  PLAN: I had a discussion with the son and his wife today.  We had a discussion about palliative radiation therapy to her whole brain.  I do not think cognitive decline is  an issue in her case since her life expectancy is certainly less than 6 months which is the timeline for cognitive decline to be detected if it does manifest itself.  I would plan on delivering 30 Gray in 10 fractions.  I willing to simulate the patient to see if she will be cooperative during treatment and I have set up a CT simulation for tomorrow.  I have had extensive discussions of risks and benefits of treatment.  Patient also will be a candidate for palliative care in the near future.  As far as her compression fracture she is not complaining of pain at this time although in the future that may be an area also for palliative treatment.  Family comprehends my recommendations well.  Appointment for simulation tomorrow was given.  I would like to take this opportunity to thank you for allowing me to participate in the care of your patient.Noreene Filbert, MD

## 2020-11-13 NOTE — Progress Notes (Signed)
PROGRESS NOTE    Danielle Steele  EPP:[id number] DOB: October 09, 1940 DOA: 11/08/2020 PCP: Abby Potash, PA-C    Brief Narrative:  80 year old female with history of hypertension, hyperlipidemia, GERD, depression, anxiety, schizophrenia, bipolar disorder who lives in an assisted living facility brought to the hospital with abdominal pain, hypotension and altered mental status.  Patient does have underlying history of extensive mental health issues but mostly functional and communicating as per family.  According to the brother, patient was treated for UTI ,2 times last month and lately on Bactrim.  Patient also noted to be progressively weak and deconditioned for last month or so.  In the emergency room, WBC count 24.1, urinalysis not very impressive for infection.  Lithium level was therapeutic.  LFTs were normal.  Lactic acid was normal.  Found to have AKI.  Urinary retention more than 750 mL urine and Foley catheter was placed.  Admitted with leukocytosis, altered mental status with unknown cause. 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 4/11, iliac crest biopsy. 4/12-MRI brain with probable mets. Oncology notified. 4/13-radiation oncology consulted.  MS improved   Assessment & Plan:   Principal Problem:   Abdominal pain Active Problems:   UTI (urinary tract infection)   Sepsis (HCC)   Schizophrenia (HCC)   Bipolar disorder (HCC)   Depression   Anxiety   Hypertension   HLD (hyperlipidemia)   AKI (acute kidney injury) ([place])   Abnormal CT scan   Acute metabolic encephalopathy   Bone metastases (HCC)   Lymphadenopathy   Palliative care encounter   Brain metastases ([place])   Goals of care, counseling/discussion  Abdominal pain and leukocytosis:  Patient was found to have metastatic lesion as below.  Urine culture negative.  Rocephin day 5 discontinue further.  Urinary retention resolved.  4/13-leukocytosis  improving a little Continue to monitor.   Altered mental status with history of multiple mental health issues including schizophrenia, bipolar, depression and anxiety: Lithium level is therapeutic. Patient is on multiple other medications including Cogentin, Wellbutrin, Klonopin that we will continue.  Continue lithium. Metabolic encephalopathy could be from Bactrim or from polypharmacy during dehydration. CT head was normal.  4/13-MRI brain with mets- started on decadron 4mg  bid. MS at baseline today, much improved.   Hypertension: Stable  Holding ACE inhibitors due to AKI and hypotension  Continue to monitor     Acute kidney injury: Likely due to urinary retention.  Resolved after Foley catheterization and IV fluids     Malignant lesion on the bone/braine mets On presentation a routine CT scan showed abnormal right iliac crest lesion.  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 CT scan of the chest abdomen pelvis with extensive lymphadenopathy both side of the diaphragm. CT-guided biopsy 4/11 with radiology.Marland KitchenMarland KitchenFinal path: A. BONE, RIGHT ILIUM; CT-GUIDED CORE BIOPSY: - METASTATIC ADENOCARCINOMA, TTF-1 POSITIVE, COMPATIBLE WITH LUNG ORIGIN Oncology following MRI brain will mets with edema.  Started on Decadron 4 mg twice daily Flow cytometry was ordered by Dr. Janese [place] Radiation oncology was consulted for palliative radiation, plan for CT simulation for tomorrow   Leukocytosis possibly from malignancy.  Could be reactive secondary to lithium.  Flow cytometry will be checked per oncology's recommendation  Updated patient's brother who is healthcare power of attorney.    DVT prophylaxis: lovenox  Code Status: DNR Family Communication: brother at bedside Disposition Plan: Status is: Inpatient  Remains inpatient appropriate because:Hemodynamically  unstable and Altered mental status   Dispo: The patient is  from: ALF              Anticipated d/c is to: Skilled nursing facility for rehab.              Patient currently is medically stable for discharge.  No   Difficult to place patient No         Consultants:   Oncology Radiation oncology, palliative care Procedures:   Biopsy  Antimicrobials:   Rocephin, 4/8----4/11   Subjective: MS improved.  Interactive.  Able to understand her better.  Objective: Vitals:   11/12/20 2001 11/13/20 0410 11/13/20 0746 11/13/20 1151  BP: (!) 117/59 (!) 120/57 (!) 115/54 121/61  Pulse: 72 81 67 76  Resp: 20 18  18   Temp: 97.7 F (36.5 C) 97.8 F (36.6 C) 98 F (36.7 C) 97.8 F (36.6 C)  TempSrc:   Oral Oral  SpO2: 94% 96% 96% 95%  Weight:      Height:        Intake/Output Summary (Last 24 hours) at 11/13/2020 1428 Last data filed at 11/13/2020 1410 Gross per 24 hour  Intake 120 ml  Output 1300 ml  Net -1180 ml   Filed Weights   11/08/20 0907  Weight: 49.9 kg    Examination: Calm, NAD CTA no wheeze rales rhonchi Regular S1-S2 no gallops Soft benign positive bowel sounds No edema Awake alert x3 grossly intact Mood and affect appropriate in current setting and improved  Data Reviewed: I have personally reviewed following labs and imaging studies  CBC: Recent Labs  Lab 11/08/20 0910 11/09/20 0601 11/10/20 0422 11/11/20 0831 11/13/20 0918  WBC 24.1* 21.3* 21.4* 24.1* 22.2*  NEUTROABS 19.1*  --  16.4* 18.9*  --   HGB 10.3* 9.8* 10.3* 11.8* 11.9*  HCT 31.8* 30.8* 32.5* 35.3* 35.9*  MCV 95.5 98.1 98.2 94.9 95.2  PLT 291 250 304 318 950   Basic Metabolic Panel: Recent Labs  Lab 11/08/20 0910 11/09/20 0601 11/10/20 0422  NA 132* 138 140  K 4.2 4.7 3.7  CL 103 113* 112*  CO2 21* 20* 21*  GLUCOSE 108* 126* 100*  BUN 25* 13 14  CREATININE 1.57* 1.04* 0.96  CALCIUM 9.5 9.2 9.8  MG  --   --  2.2  PHOS  --   --  2.1*   GFR: Estimated Creatinine Clearance: 38 mL/min (by C-G formula based on SCr of 0.96  mg/dL). Liver Function Tests: Recent Labs  Lab 11/08/20 0910 11/10/20 0422  AST 18 16  ALT 11 11  ALKPHOS 97 93  BILITOT 0.6 0.4  PROT 6.2* 6.2*  ALBUMIN 2.7* 2.8*   No results for input(s): LIPASE, AMYLASE in the last 168 hours. No results for input(s): AMMONIA in the last 168 hours. Coagulation Profile: Recent Labs  Lab 11/08/20 0910  INR 1.2   Cardiac Enzymes: No results for input(s): CKTOTAL, CKMB, CKMBINDEX, TROPONINI in the last 168 hours. BNP (last 3 results) No results for input(s): PROBNP in the last 8760 hours. HbA1C: No results for input(s): HGBA1C in the last 72 hours. CBG: No results for input(s): GLUCAP in the last 168 hours. Lipid Profile: No results for input(s): CHOL, HDL, LDLCALC, TRIG, CHOLHDL, LDLDIRECT in the last 72 hours. Thyroid Function Tests: No results for input(s): TSH, T4TOTAL, FREET4, T3FREE, THYROIDAB in the last 72 hours. Anemia Panel: Recent Labs    11/12/20 0525  VITAMINB12 205  FOLATE 3.2*  FERRITIN 145  TIBC 175*  IRON 30   Sepsis Labs: Recent Labs  Lab 11/08/20 0910  PROCALCITON <0.10  LATICACIDVEN 1.9    Recent Results (from the past 240 hour(s))  Urine culture     Status: None   Collection Time: 11/08/20  9:13 AM   Specimen: In/Out Cath Urine  Result Value Ref Range Status   Specimen Description   Final    IN/OUT CATH URINE Performed at [facility], [address] [place], [place] [address]    Special Requests   Final    NONE Performed at [facility], [address] [place], [place] [address]    Culture   Final    NO GROWTH Performed at [facility], [place] [address], [place], [place] [address]    Report Status 11/09/2020 FINAL  Final  Resp Panel by RT-PCR (Flu A&B, Covid) Nasopharyngeal Swab     Status: None   Collection Time: 11/08/20 10:34 AM   Specimen: Nasopharyngeal Swab; Nasopharyngeal(NP) swabs in vial transport medium  Result Value Ref Range Status   SARS Coronavirus 2  by RT PCR NEGATIVE NEGATIVE Final    Comment: (NOTE) SARS-CoV-2 target nucleic acids are NOT DETECTED.  The SARS-CoV-2 RNA is generally detectable in upper respiratory specimens during the acute phase of infection. The lowest concentration of SARS-CoV-2 viral copies this assay can detect is 138 copies/mL. A negative result does not preclude SARS-Cov-2 infection and should not be used as the sole basis for treatment or other patient management decisions. A negative result may occur with  improper specimen collection/handling, submission of specimen other than nasopharyngeal swab, presence of viral mutation(s) within the areas targeted by this assay, and inadequate number of viral copies(<138 copies/mL). A negative result must be combined with clinical observations, patient history, and epidemiological information. The expected result is Negative.  Fact Sheet for Patients:  EntrepreneurPulse.com.au  Fact Sheet for Healthcare Providers:  IncredibleEmployment.be  This test is no t yet approved or cleared by the [place] FDA and  has been authorized for detection and/or diagnosis of SARS-CoV-2 by FDA under an Emergency Use Authorization (EUA). This EUA will remain  in effect (meaning this test can be used) for the duration of the COVID-19 declaration under Section 564(b)(1) of the Act, 21 U.S.C.section 360bbb-3(b)(1), unless the authorization is terminated  or revoked sooner.       Influenza A by PCR NEGATIVE NEGATIVE Final   Influenza B by PCR NEGATIVE NEGATIVE Final    Comment: (NOTE) The Xpert Xpress SARS-CoV-2/FLU/RSV plus assay is intended as an aid in the diagnosis of influenza from Nasopharyngeal swab specimens and should not be used as a sole basis for treatment. Nasal washings and aspirates are unacceptable for Xpert Xpress SARS-CoV-2/FLU/RSV testing.  Fact Sheet for Patients: EntrepreneurPulse.com.au  Fact  Sheet for Healthcare Providers: IncredibleEmployment.be  This test is not yet approved or cleared by the [place] FDA and has been authorized for detection and/or diagnosis of SARS-CoV-2 by FDA under an Emergency Use Authorization (EUA). This EUA will remain in effect (meaning this test can be used) for the duration of the COVID-19 declaration under Section 564(b)(1) of the Act, 21 U.S.C. section 360bbb-3(b)(1), unless the authorization is terminated or revoked.  Performed at [facility], [address] [place], [place] [address]   CULTURE, BLOOD (ROUTINE X 2) w Reflex to ID Panel     Status: None   Collection Time: 11/08/20  1:20 PM   Specimen: BLOOD  Result Value  Ref Range Status   Specimen Description BLOOD RIGHT ANTECUBITAL  Final   Special Requests   Final    BOTTLES DRAWN AEROBIC AND ANAEROBIC Blood Culture adequate volume   Culture   Final    NO GROWTH 5 DAYS Performed at [facility], [address] [place], [place] [address]    Report Status 11/13/2020 FINAL  Final  CULTURE, BLOOD (ROUTINE X 2) w Reflex to ID Panel     Status: None   Collection Time: 11/08/20  1:20 PM   Specimen: BLOOD  Result Value Ref Range Status   Specimen Description BLOOD LEFT ANTECUBITAL  Final   Special Requests   Final    BOTTLES DRAWN AEROBIC AND ANAEROBIC Blood Culture adequate volume   Culture   Final    NO GROWTH 5 DAYS Performed at [facility], [address], [place], [place] [address]    Report Status 11/13/2020 FINAL  Final  MRSA PCR Screening     Status: None   Collection Time: 11/08/20  9:23 PM   Specimen: Nasal Mucosa; Nasopharyngeal  Result Value Ref Range Status   MRSA by PCR NEGATIVE NEGATIVE Final    Comment:        The GeneXpert MRSA Assay (FDA approved for NASAL specimens only), is one component of a comprehensive MRSA colonization surveillance program. It is not intended to diagnose MRSA infection nor to  guide or monitor treatment for MRSA infections. Performed at [facility], [place], [place] [address]   SARS CORONAVIRUS 2 (TAT 6-24 HRS) Nasopharyngeal Nasopharyngeal Swab     Status: None   Collection Time: 11/11/20  5:57 PM   Specimen: Nasopharyngeal Swab  Result Value Ref Range Status   SARS Coronavirus 2 NEGATIVE NEGATIVE Final    Comment: (NOTE) SARS-CoV-2 target nucleic acids are NOT DETECTED.  The SARS-CoV-2 RNA is generally detectable in upper and lower respiratory specimens during the acute phase of infection. Negative results do not preclude SARS-CoV-2 infection, do not rule out co-infections with other pathogens, and should not be used as the sole basis for treatment or other patient management decisions. Negative results must be combined with clinical observations, patient history, and epidemiological information. The expected result is Negative.  Fact Sheet for Patients: SugarRoll.be  Fact Sheet for Healthcare Providers: https://www.woods-mathews.com/  This test is not yet approved or cleared by the [place] FDA and  has been authorized for detection and/or diagnosis of SARS-CoV-2 by FDA under an Emergency Use Authorization (EUA). This EUA will remain  in effect (meaning this test can be used) for the duration of the COVID-19 declaration under Se ction 564(b)(1) of the Act, 21 U.S.C. section 360bbb-3(b)(1), unless the authorization is terminated or revoked sooner.  Performed at [facility], [place] [address], [place], [place] [address]          Radiology Studies: MR BRAIN W WO CONTRAST  Result Date: 11/12/2020 CLINICAL DATA:  Altered mental status. Recently discovered metastatic disease. EXAM: MRI HEAD WITHOUT AND WITH CONTRAST TECHNIQUE: Multiplanar, multiecho pulse sequences of the brain and surrounding structures were obtained without and with intravenous contrast. CONTRAST:   62mL GADAVIST GADOBUTROL 1 MMOL/ML IV SOLN COMPARISON:  Head CT 11/08/2020 FINDINGS: The study is motion degraded throughout including moderate to severe motion on postcontrast imaging. Brain: There are at least 11 small rounded foci of diffusion weighted signal abnormality involving cortex/gray-white junction predominantly posteriorly in the right greater than left cerebral hemispheres. The largest measures 1.5  cm in the high posterior right frontal lobe with mild surrounding edema. There is also evidence of mild edema surrounding a right temporal-occipital lesion. Assessment for enhancement is severely limited by motion artifact, however there is the suggestion of some enhancement of the dominant lesion in the right frontal lobe (series 20, image 10). No gross intracranial hemorrhage, midline shift, or extra-axial fluid collection is identified. There is moderate cerebral atrophy. Periventricular white matter T2 hyperintensities are nonspecific but compatible with minimal chronic small vessel ischemic disease. No definite lesions are identified in the posterior fossa. Vascular: Major intracranial vascular flow voids are preserved. Skull and upper cervical spine: Abnormal marrow signal in the C2 vertebral body and posterior elements, dens, and posterior C3 vertebral body. Sinuses/Orbits: Bilateral cataract extraction. Trace right maxillary sinus mucosal thickening. Small bilateral mastoid effusions. Other: None. IMPRESSION: 1. Severely motion degraded examination. 2. Multiple small brain lesions with at most mild edema most consistent with metastatic disease. No mass effect. 3. Abnormal marrow signal in the upper cervical spine concerning for metastatic disease. Electronically Signed   By: Logan Bores M.D.   On: 11/12/2020 13:00        Scheduled Meds: . acidophilus  1 capsule Oral Daily  . atorvastatin  20 mg Oral QPM  . benztropine  0.5 mg Oral BID  . buPROPion  150 mg Oral BH-q7a  . cholecalciferol   2,000 Units Oral q AM  . dexamethasone  4 mg Oral Q12H  . enoxaparin (LOVENOX) injection  40 mg Subcutaneous Q24H  . lithium carbonate  150 mg Oral BID WC  . loratadine  10 mg Oral Daily  . traZODone  50 mg Oral QHS   Continuous Infusions: . sodium chloride Stopped (11/10/20 1133)     LOS: 5 days    Time spent: 35 minutes with >50 % on coc    Nolberto Hanlon, MD [facility] Pager [phone]

## 2020-11-13 NOTE — Progress Notes (Signed)
Patient was given milk of magnesia and a warm drink of apple/prune juice combined. No bowel movement has occurred.  Fleet enema might be a good alternative.  Will pass information on to day shift nurse.  Continue to monitor.  Christene Slates

## 2020-11-14 ENCOUNTER — Ambulatory Visit: Payer: Medicare Other

## 2020-11-14 ENCOUNTER — Other Ambulatory Visit: Payer: Self-pay | Admitting: *Deleted

## 2020-11-14 ENCOUNTER — Inpatient Hospital Stay: Payer: Medicare Other | Attending: Radiation Oncology

## 2020-11-14 DIAGNOSIS — Z7189 Other specified counseling: Secondary | ICD-10-CM | POA: Diagnosis not present

## 2020-11-14 DIAGNOSIS — C3490 Malignant neoplasm of unspecified part of unspecified bronchus or lung: Secondary | ICD-10-CM

## 2020-11-14 DIAGNOSIS — G9341 Metabolic encephalopathy: Secondary | ICD-10-CM | POA: Diagnosis not present

## 2020-11-14 DIAGNOSIS — N179 Acute kidney failure, unspecified: Secondary | ICD-10-CM | POA: Diagnosis not present

## 2020-11-14 DIAGNOSIS — C7931 Secondary malignant neoplasm of brain: Secondary | ICD-10-CM

## 2020-11-14 DIAGNOSIS — R9389 Abnormal findings on diagnostic imaging of other specified body structures: Secondary | ICD-10-CM | POA: Diagnosis not present

## 2020-11-14 DIAGNOSIS — R1011 Right upper quadrant pain: Secondary | ICD-10-CM | POA: Diagnosis not present

## 2020-11-14 NOTE — TOC Progression Note (Signed)
Transition of Care [facility]) - Progression Note    Patient Details  Name: Danielle Steele MRN: [id number] Date of Birth: [birth year]  [facility]) CM/SW Contact  Beverly Sessions, RN Phone Number: 11/14/2020, 4:31 PM  Clinical Narrative:    Per notes from yesterday.  Appoinment for simulation today to determine if patient would be a candidate for palliative radiation.  If patient is receiving outpatient palliative [place] said they may no longer able to offer a bed  Received call from brother stating that simulation was not successful and palliative radiation would not be an option.  Brother inquires about SNF with Palliative vs ALF with Hospice.  I have reached out to MD and palliative to follow on goals of care to determine appropriate disposition    Expected Discharge Plan: [place] Barriers to Discharge: Continued Medical Work up  Expected Discharge Plan and Services Expected Discharge Plan: [place] arrangements for the past 2 months: [place]                                       Social Determinants of Health (SDOH) Interventions    Readmission Risk Interventions No flowsheet data found.

## 2020-11-14 NOTE — TOC Progression Note (Incomplete)
Transition of Care [facility]) - Progression Note    Patient Details  Name: Danielle Steele MRN: [id number] Date of Birth: 04-26-1941  Transition of Care [facility]) CM/SW Contact  Beverly Sessions, RN Phone Number: 11/14/2020, 9:00 AM  Clinical Narrative:    Per notes from yesterday.  Appoinment for simulation today to determine if patient would be a candidate for palliative rad   Expected Discharge Plan: [place] Barriers to Discharge: Continued Medical Work up  Expected Discharge Plan and Services Expected Discharge Plan: [place] arrangements for the past 2 months: [place]                                       Social Determinants of Health (SDOH) Interventions    Readmission Risk Interventions No flowsheet data found.

## 2020-11-14 NOTE — Progress Notes (Signed)
Physical Therapy Treatment Patient Details Name: Danielle Steele MRN: [id number] DOB: 10-29-1940 Today's Date: 11/14/2020    History of Present Illness Pt is a 80 y/o F with PMH: HTN, HLD, DM, GERD, pre-diabetes, schizophrenia, bipolar disorder, depression, anxiety and tremors who presented to ED from [facility] assisted [facility] d/t abdominal pain, hypotension and altered mental status. Abdominal pain etiology unclear, per MD note: CT w/ mildly distended gallbladder but no overt thickening, abd [place] negative and LFTs normal. Pt adm d/t meeting sepsis criteria with WBC of 24.1. Suspect septic 2/2 UTI.    PT Comments    Patient is agreeable to PT. Patient is able to follow most commands with extra time, occasional cues for attention to task. Patient continues to require assistance for bed mobility and transfers. Limited overall standing tolerance with poor standing balance limits progression of ambulation. No dizziness reported during standing. Sp02 92% on room air after activity. Patient participated with in bed exercises for strengthening of BLE. Recommend to continue PT to maximize independence and address remaining functional limitations. SNF recommended at discharge.    Follow Up Recommendations  SNF     Equipment Recommendations  None recommended by PT (to be determined at next level of care)    Recommendations for Other Services       Precautions / Restrictions Precautions Precautions: Fall Restrictions Weight Bearing Restrictions: No    Mobility  Bed Mobility Overal bed mobility: Needs Assistance Bed Mobility: Supine to Sit;Sit to Supine     Supine to sit: Min assist Sit to supine: Min assist   General bed mobility comments: assistance for trunk suppor to sit upright. assistance for LE support to return to bed. increased time for motor planning and processing    Transfers Overall transfer level: Needs assistance Equipment used:  (patient declined using rolling  walker) Transfers: Sit to/from Stand Sit to Stand: Min assist;Mod assist         General transfer comment: lifting and lowering assistance provided for standing x 2 bouts. verbal and tactile cues for technique for safety and to facilitate independence  Ambulation/Gait             General Gait Details: unable to progress to ambulation due to poor standing tolerance, poor standing balance with posteior lean   Stairs             Wheelchair Mobility    Modified Rankin (Stroke Patients Only)       Balance   Sitting-balance support: Bilateral upper extremity supported;Feet supported Sitting balance-Leahy Scale: Poor Sitting balance - Comments: occasional minimal assistance required to maintain sitting balance. occasional posterior and right lean in sitting position Postural control: Right lateral lean;Posterior lean Standing balance support: No upper extremity supported Standing balance-Leahy Scale: Poor Standing balance comment: patient required Mod A - Max A to maintain standing balance with posterior lean and standing tolerance of less than 25 seconds (performed  x 2 bouts)                            Cognition Arousal/Alertness: Awake/alert Behavior During Therapy: WFL for tasks assessed/performed Overall Cognitive Status: Impaired/Different from baseline Area of Impairment: Memory;Attention;Orientation;Following commands                 Orientation Level: Disoriented to;Situation Current Attention Level: Alternating Memory: Decreased short-term memory Following Commands: Follows one step commands with increased time Safety/Judgement: Decreased awareness of deficits;Decreased awareness of safety   Problem Solving:  Requires verbal cues;Requires tactile cues;Difficulty sequencing;Slow processing;Decreased initiation        Exercises General Exercises - Lower Extremity Ankle Circles/Pumps: AROM;Strengthening;Both;10 reps;Supine Heel Slides:  AAROM;Strengthening;Both;10 reps;Supine Hip ABduction/ADduction: AAROM;Strengthening;Both;10 reps;Supine Straight Leg Raises: AAROM;Strengthening;Both;10 reps;Supine Other Exercises Other Exercises: verbal and tactile cues for technique for strengthening of BLE    General Comments        Pertinent Vitals/Pain Pain Assessment: No/denies pain    Home Living                      Prior Function            PT Goals (current goals can now be found in the care plan section) Acute Rehab PT Goals Patient Stated Goal: to walk with a walker PT Goal Formulation: With patient Time For Goal Achievement: 11/24/20 Potential to Achieve Goals: Fair Progress towards PT goals: Progressing toward goals    Frequency    Min 2X/week      PT Plan Current plan remains appropriate    Co-evaluation              AM-PAC PT "6 Clicks" Mobility   Outcome Measure  Help needed turning from your back to your side while in a flat bed without using bedrails?: A Little Help needed moving from lying on your back to sitting on the side of a flat bed without using bedrails?: A Little Help needed moving to and from a bed to a chair (including a wheelchair)?: A Lot Help needed standing up from a chair using your arms (e.g., wheelchair or bedside chair)?: A Little Help needed to walk in hospital room?: A Lot Help needed climbing 3-5 steps with a railing? : Total 6 Click Score: 14    End of Session Equipment Utilized During Treatment: Gait belt Activity Tolerance: Patient limited by fatigue Patient left: in bed;with call bell/phone within reach;with bed alarm set Nurse Communication:  (white board up to date with mobility status) PT Visit Diagnosis: Unsteadiness on feet (R26.81);Difficulty in walking, not elsewhere classified (R26.2);Muscle weakness (generalized) (M62.81)     Time: 8280-0349 PT Time Calculation (min) (ACUTE ONLY): 23 min  Charges:  $Therapeutic Exercise: 8-22  mins $Therapeutic Activity: 8-22 mins                     Minna Merritts, PT, MPT    Percell Locus 11/14/2020, 3:18 PM

## 2020-11-14 NOTE — Progress Notes (Signed)
PROGRESS NOTE    Danielle Steele  NOI:[id number] DOB: 1941-05-04 DOA: 11/08/2020 PCP: Abby Potash, PA-C    Brief Narrative:  80 year old female with history of hypertension, hyperlipidemia, GERD, depression, anxiety, schizophrenia, bipolar disorder who lives in an assisted living facility brought to the hospital with abdominal pain, hypotension and altered mental status.  Patient does have underlying history of extensive mental health issues but mostly functional and communicating as per family.  According to the brother, patient was treated for UTI ,2 times last month and lately on Bactrim.  Patient also noted to be progressively weak and deconditioned for last month or so.  In the emergency room, WBC count 24.1, urinalysis not very impressive for infection.  Lithium level was therapeutic.  LFTs were normal.  Lactic acid was normal.  Found to have AKI.  Urinary retention more than 750 mL urine and Foley catheter was placed.  Admitted with leukocytosis, altered mental status with unknown cause. 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 4/11, iliac crest biopsy. 4/12-MRI brain with probable mets. Oncology notified. 4/13-radiation oncology consulted.  MS improved 4/14- going for Radiation today   Assessment & Plan:   Principal Problem:   Abdominal pain Active Problems:   UTI (urinary tract infection)   Sepsis (HCC)   Schizophrenia (HCC)   Bipolar disorder (HCC)   Depression   Anxiety   Hypertension   HLD (hyperlipidemia)   AKI (acute kidney injury) ([place])   Abnormal CT scan   Acute metabolic encephalopathy   Bone metastases (HCC)   Lymphadenopathy   Palliative care encounter   Brain metastases ([place])   Goals of care, counseling/discussion  Abdominal pain and leukocytosis:  Patient was found to have metastatic lesion as below.  Urine culture negative.  Rocephin day 5 discontinue further.  Urinary retention  resolved.  414 leukocytosis improving  Continue to monitor  Afebrile, no abdominal pain reported   Altered mental status with history of multiple mental health issues including schizophrenia, bipolar, depression and anxiety: Lithium level is therapeutic. Patient is on multiple other medications including Cogentin, Wellbutrin, Klonopin that we will continue.  Continue lithium. Metabolic encephalopathy could be from Bactrim or from polypharmacy during dehydration. CT head was normal.  4/13-MRI brain with mets- started on decadron 4mg  bid. MS at baseline today, much improved.  4/14-MS at baseline, starting brain radiation    Hypertension: Stable  Stable  holding ACE inhibitor due to AKI and hypotension  Continue to monitor      Acute kidney injury: Likely due to urinary retention.  Resolved after Foley catheterization and IV fluids     Malignant lesion on the bone/braine mets On presentation a routine CT scan showed abnormal right iliac crest lesion.  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 CT scan of the chest abdomen pelvis with extensive lymphadenopathy both side of the diaphragm. CT-guided biopsy 4/11 with radiology.Marland KitchenMarland KitchenFinal path: A. BONE, RIGHT ILIUM; CT-GUIDED CORE BIOPSY: - METASTATIC ADENOCARCINOMA, TTF-1 POSITIVE, COMPATIBLE WITH LUNG ORIGIN Oncology following MRI brain will mets with edema.  Started on Decadron 4 mg twice daily Flow cytometry was ordered by Dr. Janese Banks Radiation oncology was consulted for palliative radiation, plan for CT simulation for tomorrow 4/14-initiating radiation/simulation to brain today will need 10 cycles   Leukocytosis possibly from malignancy.  Could be reactive secondary to lithium.  Flow cytometry will be checked per oncology's recommendation  Updated patient's brother  who is healthcare power of attorney.    DVT prophylaxis: lovenox Code Status: DNR Family  Communication: None at bedside Disposition Plan: Status is: Inpatient  Remains inpatient appropriate because:Hemodynamically unstable and Altered mental status   Dispo: The patient is from: ALF              Anticipated d/c is to: Skilled nursing facility for rehab.              Patient currently is medically stable for discharge.  No   Difficult to place patient No         Consultants:   Oncology Radiation oncology, palliative care Procedures:   Biopsy  Antimicrobials:   Rocephin, 4/8----4/11   Subjective: Has no complaints.  Denies abdominal pain, headache, vision change or shortness of breath  Objective: Vitals:   11/13/20 1642 11/13/20 1647 11/13/20 2110 11/14/20 0334  BP:  (!) 124/55 140/68 (!) 127/52  Pulse: 80 80 74 74  Resp:  20 16 16   Temp:  98.6 F (37 C) 98.2 F (36.8 C) 97.8 F (36.6 C)  TempSrc:  Oral Oral Oral  SpO2: 96% 96% 93% 93%  Weight:      Height:        Intake/Output Summary (Last 24 hours) at 11/14/2020 0810 Last data filed at 11/14/2020 0645 Gross per 24 hour  Intake 120 ml  Output 2100 ml  Net -1980 ml   Filed Weights   11/08/20 0907  Weight: 49.9 kg    Examination: Calm, NAD CTA no wheeze rales rhonchi's Regular S1-S2 no gallops Soft benign positive bowel sounds No edema Grossly intact Mood and affect appropriate  Data Reviewed: I have personally reviewed following labs and imaging studies  CBC: Recent Labs  Lab 11/08/20 0910 11/09/20 0601 11/10/20 0422 11/11/20 0831 11/13/20 0918  WBC 24.1* 21.3* 21.4* 24.1* 22.2*  NEUTROABS 19.1*  --  16.4* 18.9*  --   HGB 10.3* 9.8* 10.3* 11.8* 11.9*  HCT 31.8* 30.8* 32.5* 35.3* 35.9*  MCV 95.5 98.1 98.2 94.9 95.2  PLT 291 250 304 318 378   Basic Metabolic Panel: Recent Labs  Lab 11/08/20 0910 11/09/20 0601 11/10/20 0422  NA 132* 138 140  K 4.2 4.7 3.7  CL 103 113* 112*  CO2 21* 20* 21*  GLUCOSE 108* 126* 100*  BUN 25* 13 14  CREATININE 1.57* 1.04* 0.96   CALCIUM 9.5 9.2 9.8  MG  --   --  2.2  PHOS  --   --  2.1*   GFR: Estimated Creatinine Clearance: 38 mL/min (by C-G formula based on SCr of 0.96 mg/dL). Liver Function Tests: Recent Labs  Lab 11/08/20 0910 11/10/20 0422  AST 18 16  ALT 11 11  ALKPHOS 97 93  BILITOT 0.6 0.4  PROT 6.2* 6.2*  ALBUMIN 2.7* 2.8*   No results for input(s): LIPASE, AMYLASE in the last 168 hours. No results for input(s): AMMONIA in the last 168 hours. Coagulation Profile: Recent Labs  Lab 11/08/20 0910  INR 1.2   Cardiac Enzymes: No results for input(s): CKTOTAL, CKMB, CKMBINDEX, TROPONINI in the last 168 hours. BNP (last 3 results) No results for input(s): PROBNP in the last 8760 hours. HbA1C: No results for input(s): HGBA1C in the last 72 hours. CBG: No results for input(s): GLUCAP in the last 168 hours. Lipid Profile: No results for input(s): CHOL, HDL, LDLCALC, TRIG, CHOLHDL, LDLDIRECT in the last 72 hours. Thyroid Function Tests: No results for input(s): TSH, T4TOTAL, FREET4, T3FREE, THYROIDAB in  the last 72 hours. Anemia Panel: Recent Labs    11/12/20 0525  VITAMINB12 205  FOLATE 3.2*  FERRITIN 145  TIBC 175*  IRON 30   Sepsis Labs: Recent Labs  Lab 11/08/20 0910  PROCALCITON <0.10  LATICACIDVEN 1.9    Recent Results (from the past 240 hour(s))  Urine culture     Status: None   Collection Time: 11/08/20  9:13 AM   Specimen: In/Out Cath Urine  Result Value Ref Range Status   Specimen Description   Final    IN/OUT CATH URINE Performed at [facility], [address] [place], [place] [address]    Special Requests   Final    NONE Performed at [facility], [address] [place], [place] [address]    Culture   Final    NO GROWTH Performed at [facility], [place] [employer] [address], [place], [place] [address]    Report Status 11/09/2020 FINAL  Final  Resp Panel by RT-PCR (Flu A&B, Covid) Nasopharyngeal Swab     Status: None   Collection Time:  11/08/20 10:34 AM   Specimen: Nasopharyngeal Swab; Nasopharyngeal(NP) swabs in vial transport medium  Result Value Ref Range Status   SARS Coronavirus 2 by RT PCR NEGATIVE NEGATIVE Final    Comment: (NOTE) SARS-CoV-2 target nucleic acids are NOT DETECTED.  The SARS-CoV-2 RNA is generally detectable in upper respiratory specimens during the acute phase of infection. The lowest concentration of SARS-CoV-2 viral copies this assay can detect is 138 copies/mL. A negative result does not preclude SARS-Cov-2 infection and should not be used as the sole basis for treatment or other patient management decisions. A negative result may occur with  improper specimen collection/handling, submission of specimen other than nasopharyngeal swab, presence of viral mutation(s) within the areas targeted by this assay, and inadequate number of viral copies(<138 copies/mL). A negative result must be combined with clinical observations, patient history, and epidemiological information. The expected result is Negative.  Fact Sheet for Patients:  EntrepreneurPulse.com.au  Fact Sheet for Healthcare Providers:  IncredibleEmployment.be  This test is no t yet approved or cleared by the [place] FDA and  has been authorized for detection and/or diagnosis of SARS-CoV-2 by FDA under an Emergency Use Authorization (EUA). This EUA will remain  in effect (meaning this test can be used) for the duration of the COVID-19 declaration under Section 564(b)(1) of the Act, 21 U.S.C.section 360bbb-3(b)(1), unless the authorization is terminated  or revoked sooner.       Influenza A by PCR NEGATIVE NEGATIVE Final   Influenza B by PCR NEGATIVE NEGATIVE Final    Comment: (NOTE) The Xpert Xpress SARS-CoV-2/FLU/RSV plus assay is intended as an aid in the diagnosis of influenza from Nasopharyngeal swab specimens and should not be used as a sole basis for treatment. Nasal washings  and aspirates are unacceptable for Xpert Xpress SARS-CoV-2/FLU/RSV testing.  Fact Sheet for Patients: EntrepreneurPulse.com.au  Fact Sheet for Healthcare Providers: IncredibleEmployment.be  This test is not yet approved or cleared by the [place] FDA and has been authorized for detection and/or diagnosis of SARS-CoV-2 by FDA under an Emergency Use Authorization (EUA). This EUA will remain in effect (meaning this test can be used) for the duration of the COVID-19 declaration under Section 564(b)(1) of the Act, 21 U.S.C. section 360bbb-3(b)(1), unless the authorization is terminated or revoked.  Performed at [facility], [address] [place],  [address]   CULTURE, BLOOD (ROUTINE X 2) w Reflex to  ID Panel     Status: None   Collection Time: 11/08/20  1:20 PM   Specimen: BLOOD  Result Value Ref Range Status   Specimen Description BLOOD RIGHT ANTECUBITAL  Final   Special Requests   Final    BOTTLES DRAWN AEROBIC AND ANAEROBIC Blood Culture adequate volume   Culture   Final    NO GROWTH 5 DAYS Performed at [facility], [address] [place], [place] [address]    Report Status 11/13/2020 FINAL  Final  CULTURE, BLOOD (ROUTINE X 2) w Reflex to ID Panel     Status: None   Collection Time: 11/08/20  1:20 PM   Specimen: BLOOD  Result Value Ref Range Status   Specimen Description BLOOD LEFT ANTECUBITAL  Final   Special Requests   Final    BOTTLES DRAWN AEROBIC AND ANAEROBIC Blood Culture adequate volume   Culture   Final    NO GROWTH 5 DAYS Performed at [facility], [address] [place], [place] [address]    Report Status 11/13/2020 FINAL  Final  MRSA PCR Screening     Status: None   Collection Time: 11/08/20  9:23 PM   Specimen: Nasal Mucosa; Nasopharyngeal  Result Value Ref Range Status   MRSA by PCR NEGATIVE NEGATIVE Final    Comment:        The GeneXpert MRSA Assay (FDA approved for  NASAL specimens only), is one component of a comprehensive MRSA colonization surveillance program. It is not intended to diagnose MRSA infection nor to guide or monitor treatment for MRSA infections. Performed at [facility], [place], [place] [address]   SARS CORONAVIRUS 2 (TAT 6-24 HRS) Nasopharyngeal Nasopharyngeal Swab     Status: None   Collection Time: 11/11/20  5:57 PM   Specimen: Nasopharyngeal Swab  Result Value Ref Range Status   SARS Coronavirus 2 NEGATIVE NEGATIVE Final    Comment: (NOTE) SARS-CoV-2 target nucleic acids are NOT DETECTED.  The SARS-CoV-2 RNA is generally detectable in upper and lower respiratory specimens during the acute phase of infection. Negative results do not preclude SARS-CoV-2 infection, do not rule out co-infections with other pathogens, and should not be used as the sole basis for treatment or other patient management decisions. Negative results must be combined with clinical observations, patient history, and epidemiological information. The expected result is Negative.  Fact Sheet for Patients: SugarRoll.be  Fact Sheet for Healthcare Providers: https://www.woods-mathews.com/  This test is not yet approved or cleared by the [place] FDA and  has been authorized for detection and/or diagnosis of SARS-CoV-2 by FDA under an Emergency Use Authorization (EUA). This EUA will remain  in effect (meaning this test can be used) for the duration of the COVID-19 declaration under Se ction 564(b)(1) of the Act, 21 U.S.C. section 360bbb-3(b)(1), unless the authorization is terminated or revoked sooner.  Performed at [facility], [place] [address], [place], [place] [address]          Radiology Studies: MR BRAIN W WO CONTRAST  Result Date: 11/12/2020 CLINICAL DATA:  Altered mental status. Recently discovered metastatic disease. EXAM: MRI HEAD WITHOUT AND WITH CONTRAST  TECHNIQUE: Multiplanar, multiecho pulse sequences of the brain and surrounding structures were obtained without and with intravenous contrast. CONTRAST:  46mL GADAVIST GADOBUTROL 1 MMOL/ML IV SOLN COMPARISON:  Head CT 11/08/2020 FINDINGS: The study is motion degraded throughout including moderate to severe motion on postcontrast imaging. Brain: There are at least 11 small rounded  foci of diffusion weighted signal abnormality involving cortex/gray-white junction predominantly posteriorly in the right greater than left cerebral hemispheres. The largest measures 1.5 cm in the high posterior right frontal lobe with mild surrounding edema. There is also evidence of mild edema surrounding a right temporal-occipital lesion. Assessment for enhancement is severely limited by motion artifact, however there is the suggestion of some enhancement of the dominant lesion in the right frontal lobe (series 20, image 10). No gross intracranial hemorrhage, midline shift, or extra-axial fluid collection is identified. There is moderate cerebral atrophy. Periventricular white matter T2 hyperintensities are nonspecific but compatible with minimal chronic small vessel ischemic disease. No definite lesions are identified in the posterior fossa. Vascular: Major intracranial vascular flow voids are preserved. Skull and upper cervical spine: Abnormal marrow signal in the C2 vertebral body and posterior elements, dens, and posterior C3 vertebral body. Sinuses/Orbits: Bilateral cataract extraction. Trace right maxillary sinus mucosal thickening. Small bilateral mastoid effusions. Other: None. IMPRESSION: 1. Severely motion degraded examination. 2. Multiple small brain lesions with at most mild edema most consistent with metastatic disease. No mass effect. 3. Abnormal marrow signal in the upper cervical spine concerning for metastatic disease. Electronically Signed   By: Logan Bores M.D.   On: 11/12/2020 13:00        Scheduled Meds: .  acidophilus  1 capsule Oral Daily  . atorvastatin  20 mg Oral QPM  . benztropine  0.5 mg Oral BID  . buPROPion  150 mg Oral BH-q7a  . cholecalciferol  2,000 Units Oral q AM  . dexamethasone  4 mg Oral Q12H  . enoxaparin (LOVENOX) injection  40 mg Subcutaneous Q24H  . lithium carbonate  150 mg Oral BID WC  . loratadine  10 mg Oral Daily  . traZODone  50 mg Oral QHS   Continuous Infusions: . sodium chloride Stopped (11/10/20 1133)     LOS: 6 days    Time spent: 35 minutes with >50 % on coc    Nolberto Hanlon, MD [facility] Pager [phone]

## 2020-11-15 DIAGNOSIS — R1011 Right upper quadrant pain: Secondary | ICD-10-CM | POA: Diagnosis not present

## 2020-11-15 DIAGNOSIS — N179 Acute kidney failure, unspecified: Secondary | ICD-10-CM | POA: Diagnosis not present

## 2020-11-15 DIAGNOSIS — Z515 Encounter for palliative care: Secondary | ICD-10-CM | POA: Diagnosis not present

## 2020-11-15 DIAGNOSIS — C3490 Malignant neoplasm of unspecified part of unspecified bronchus or lung: Secondary | ICD-10-CM

## 2020-11-15 DIAGNOSIS — R9389 Abnormal findings on diagnostic imaging of other specified body structures: Secondary | ICD-10-CM | POA: Diagnosis not present

## 2020-11-15 DIAGNOSIS — G9341 Metabolic encephalopathy: Secondary | ICD-10-CM | POA: Diagnosis not present

## 2020-11-15 LAB — RESP PANEL BY RT-PCR (FLU A&B, COVID) ARPGX2
Influenza A by PCR: NEGATIVE
Influenza B by PCR: NEGATIVE
SARS Coronavirus 2 by RT PCR: NEGATIVE

## 2020-11-15 MED ORDER — DEXAMETHASONE 4 MG PO TABS: 4.0000 mg | ORAL_TABLET | Freq: Two times a day (BID) | ORAL | Status: DC

## 2020-11-15 MED ORDER — CLONAZEPAM 0.5 MG PO TABS: 0.5000 mg | ORAL_TABLET | Freq: Two times a day (BID) | ORAL | 0 refills | Status: AC | PRN

## 2020-11-15 NOTE — TOC Progression Note (Addendum)
Transition of Care [facility]) - Progression Note    Patient Details  Name: Danielle Steele MRN: [id number] Date of Birth: [birth year]  [facility] CM/SW [place], LCSW Phone Number: 11/15/2020, 8:56 AM  Clinical Narrative:   Per Dr. Janese Banks and Dr. Kurtis Bushman, patient did not tolerate radiation simluation. Plan for short term rehab with Outpatient Palliative. Following SNF rehab, transition to home with hospice/hospice home/SNF with hospice. Left VM for Danielle Steele at [facility] requesting a return call with whether they can accept patient today.  10:30- Spoke with Danielle Steele at [facility] who can take patient today with a private room. Spoke to patient brother Danielle Steele) and sister in law Danielle Steele), they prefer [facility]. Spoke to [employer] at [facility] who confirmed they can take patient today as well but patient would start out in a semi private room. Updated Danielle Steele who is going to talk to family and call CSW right back on decision. CSW also called [facility] and spoke to [place] who confirmed patient has had the COVID booster vaccine on 06/11/20.  10:35- Family chose [facility]. Notified Geophysicist/field seismologist, [facility], and Therapist, sports. Asked for rapid COVID test.  Expected Discharge Plan: [place] Barriers to Discharge: Continued Medical Work up  Expected Discharge Plan and Services Expected Discharge Plan: [place] arrangements for the past 2 months: [place]                                       Social Determinants of Health (SDOH) Interventions    Readmission Risk Interventions No flowsheet data found.

## 2020-11-15 NOTE — Progress Notes (Signed)
Hematology/Oncology Consult note [facility]  [phone] [phone]  Patient Care Team: Maia Petties as PCP - General (Physician Assistant)   Name of the patient: Danielle Steele  [id number]  1941-03-26   Date of visit: 11/14/20   Interval history- Patient could not lay flat for radiation simulation. She is alert and awake but remains confused.      Review of systems- limited due to underlying mental status. Mainly complains of back pain   Allergies  Allergen Reactions  . Citrus Rash     Past Medical History:  Diagnosis Date  . Anxiety   . Bipolar disorder (HCC)   . Depression   . GERD (gastroesophageal reflux disease)   . Hypertension   . PND (post-nasal drip)    CAUSES SOME COUGH  . Pre-diabetes   . Schizophrenia (HCC)    SCHIZO  AFFECTIVE DISORDER  . Thyroid nodule   . Tremors of nervous system      Past Surgical History:  Procedure Laterality Date  . CATARACT EXTRACTION W/PHACO Left 09/07/2017   Procedure: CATARACT EXTRACTION PHACO AND INTRAOCULAR LENS PLACEMENT (IOC);  Surgeon: Galen Manila, MD;  Location: [facility] ORS;  Service: Ophthalmology;  Laterality: Left;  [place] 00:39.9 AP% 17.4 CDE 6.94 Fluid Pack lot # [id number]  . CATARACT EXTRACTION W/PHACO Right 09/28/2017   Procedure: CATARACT EXTRACTION PHACO AND INTRAOCULAR LENS PLACEMENT (IOC);  Surgeon: Galen Manila, MD;  Location: [facility] ORS;  Service: Ophthalmology;  Laterality: Right;  [place] 01:31.8 AP% 13.7 CDE 12.55 Fluid Pack Lot # [id number]   . EYE SURGERY    . TONSILLECTOMY      Social History   Socioeconomic History  . Marital status: Married    Spouse name: Not on file  . Number of children: Not on file  . Years of education: Not on file  . Highest education level: Not on file  Occupational History  . Not on file  Tobacco Use  . Smoking status: Former Games developer  . Smokeless tobacco: Never Used  Vaping Use  . Vaping Use: Never  used  Substance and Sexual Activity  . Alcohol use: No  . Drug use: No  . Sexual activity: Not on file  Other Topics Concern  . Not on file  Social History Narrative  . Not on file   Social Determinants of Health   Financial Resource Strain: Not on file  Food Insecurity: Not on file  Transportation Needs: Not on file  Physical Activity: Not on file  Stress: Not on file  Social Connections: Not on file  Intimate Partner Violence: Not on file    History reviewed. No pertinent family history.   Current Facility-Administered Medications:  .  0.9 %  sodium chloride infusion, , Intravenous, PRN, Dorcas Carrow, MD, Stopped at 11/10/20 1133 .  acetaminophen (TYLENOL) tablet 650 mg, 650 mg, Oral, Q6H PRN, Lorretta Harp, MD, 650 mg at 11/14/20 1309 .  acidophilus (RISAQUAD) capsule 1 capsule, 1 capsule, Oral, Daily, Dorcas Carrow, MD, 1 capsule at 11/14/20 0854 .  atorvastatin (LIPITOR) tablet 20 mg, 20 mg, Oral, QPM, Lorretta Harp, MD, 20 mg at 11/14/20 1708 .  benztropine (COGENTIN) tablet 0.5 mg, 0.5 mg, Oral, BID, Lorretta Harp, MD, 0.5 mg at 11/14/20 2026 .  buPROPion (WELLBUTRIN XL) 24 hr tablet 150 mg, 150 mg, Oral, Leo Grosser, MD, 150 mg at 11/14/20 0854 .  cholecalciferol (VITAMIN D) tablet 2,000 Units, 2,000 Units, Oral, q AM, Lorretta Harp, MD, 2,000 Units  at 11/14/20 0855 .  clonazePAM (KLONOPIN) tablet 0.5 mg, 0.5 mg, Oral, BID PRN, Barb Merino, MD .  dexamethasone (DECADRON) tablet 4 mg, 4 mg, Oral, Q12H, Borders, Kirt Boys, NP, 4 mg at 11/14/20 2026 .  enoxaparin (LOVENOX) injection 40 mg, 40 mg, Subcutaneous, Q24H, Lu Duffel, RPH, 40 mg at 11/14/20 0854 .  guaiFENesin (ROBITUSSIN) 100 MG/5ML solution 300 mg, 300 mg, Oral, Q6H PRN, Ivor Costa, MD .  lithium carbonate capsule 150 mg, 150 mg, Oral, BID WC, Ivor Costa, MD, 150 mg at 11/14/20 1708 .  loperamide (IMODIUM) capsule 4 mg, 4 mg, Oral, PRN, Ivor Costa, MD .  loratadine (CLARITIN) tablet 10 mg, 10 mg, Oral,  Daily, Ivor Costa, MD, 10 mg at 11/14/20 0854 .  magnesium hydroxide (MILK OF MAGNESIA) suspension 30 mL, 30 mL, Oral, Daily PRN, Ivor Costa, MD, 30 mL at 11/12/20 2018 .  morphine 2 MG/ML injection 0.5 mg, 0.5 mg, Intravenous, Q4H PRN, Ivor Costa, MD, 0.5 mg at 11/14/20 2040 .  ondansetron (ZOFRAN) injection 4 mg, 4 mg, Intravenous, Q8H PRN, Ivor Costa, MD .  traZODone (DESYREL) tablet 50 mg, 50 mg, Oral, QHS, Ivor Costa, MD, 50 mg at 11/14/20 2026  Physical exam:  Vitals:   11/14/20 1306 11/14/20 1552 11/14/20 1951 11/15/20 0344  BP: (!) 123/56 111/61 127/81 (!) 142/74  Pulse: 80 76 84 77  Resp: $Remo'18 20 16 16  'kzwJn$ Temp: 98.8 F (37.1 C) 98.5 F (36.9 C) 98.1 F (36.7 C) (!) 97.5 F (36.4 C)  TempSrc: Oral  Oral Oral  SpO2: 97% 94% 92% 94%  Weight:      Height:       Patient remains confused but alert and comfortable Breathing non labored  CMP Latest Ref Rng & Units 11/10/2020  Glucose 70 - 99 mg/dL 100(H)  BUN 8 - 23 mg/dL 14  Creatinine 0.44 - 1.00 mg/dL 0.96  Sodium 135 - 145 mmol/L 140  Potassium 3.5 - 5.1 mmol/L 3.7  Chloride 98 - 111 mmol/L 112(H)  CO2 22 - 32 mmol/L 21(L)  Calcium 8.9 - 10.3 mg/dL 9.8  Total Protein 6.5 - 8.1 g/dL 6.2(L)  Total Bilirubin 0.3 - 1.2 mg/dL 0.4  Alkaline Phos 38 - 126 U/L 93  AST 15 - 41 U/L 16  ALT 0 - 44 U/L 11   CBC Latest Ref Rng & Units 11/13/2020  WBC 4.0 - 10.5 K/uL 22.2(H)  Hemoglobin 12.0 - 15.0 g/dL 11.9(L)  Hematocrit 36.0 - 46.0 % 35.9(L)  Platelets 150 - 400 K/uL 326    '@IMAGES'$ @  CT ABDOMEN PELVIS WO CONTRAST  Result Date: 11/08/2020 CLINICAL DATA:  Abdominal pain and fever EXAM: CT ABDOMEN AND PELVIS WITHOUT CONTRAST TECHNIQUE: Multidetector CT imaging of the abdomen and pelvis was performed following the standard protocol without oral or IV contrast. COMPARISON:  None. FINDINGS: Lower chest: There is bibasilar atelectatic change. No consolidation in the lung bases. There are foci of coronary artery calcification.  Hepatobiliary: No focal liver lesions are appreciable. The gallbladder appears distended. Gallbladder wall does not appear appreciably thickened by CT. No biliary duct dilatation evident. Pancreas: There is no pancreatic mass or inflammatory focus. Spleen: No splenic lesions are evident. Adrenals/Urinary Tract: There is mild adrenal hypertrophy bilaterally. No focal adrenal lesions evident. There is no appreciable renal mass or hydronephrosis on either side. There is no evident renal or ureteral calculus on either side. The urinary bladder appears distended without wall thickening. Stomach/Bowel: There is no appreciable bowel wall or  mesenteric thickening. There is moderate stool and air in the colon. There is no evident bowel obstruction. Terminal ileum appears unremarkable. Appendix appears normal. No evident free air or portal venous air. Vascular/Lymphatic: There is no abdominal aortic aneurysm. There is extensive aortic and iliac artery atherosclerotic calcification. No adenopathy is evident in the abdomen or pelvis. Reproductive: Uterus is mildly canted to the left. No adnexal masses are evident. Other: No evident abscess or ascites in the abdomen or pelvis. Musculoskeletal: Relative sclerosis noted in the right iliac bone in a portion of the right acetabulum with mildly irregular periosteum, likely due to Paget's disease. No lytic or destructive lesions are evident. There is degenerative change in each pubic symphysis. No intramuscular lesions evident. IMPRESSION: 1. 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 Note that there is mildly irregular periosteum in this area. An atypical bony neoplasm could potentially present in this manner. Given this appearance of the periosteum in this area, correlation with MR of the right iliac crest and acetabulum is advised to further evaluate. 2. Gallbladder is mildly distended without overt wall thickening by CT. No biliary duct dilatation.  Correlation with ultrasound of the gallbladder may be advisable in this circumstance. 3. Urinary bladder distended without wall thickening. No renal or ureteral calculus. No hydronephrosis on either side. 4. No evident bowel obstruction. No abscess in the abdomen or pelvis. Appendix region appears normal. 5. Aortic Atherosclerosis (ICD10-I70.0). Foci of coronary artery and iliac artery atherosclerotic calcification noted. 6. Mild adrenal hypertrophy bilaterally without focal adrenal lesion. Significance of mild adrenal hypertrophy is uncertain in this age group. Electronically Signed   By: Lowella Grip III M.D.   On: 11/08/2020 10:30   CT HEAD WO CONTRAST  Result Date: 11/08/2020 CLINICAL DATA:  Mental status change EXAM: CT HEAD WITHOUT CONTRAST TECHNIQUE: Contiguous axial images were obtained from the base of the skull through the vertex without intravenous contrast. COMPARISON:  October 10, 2020 FINDINGS: Brain: No evidence of acute territorial infarction, hemorrhage, hydrocephalus,extra-axial collection or mass lesion/mass effect. There is dilatation the ventricles and sulci consistent with age-related atrophy. Low-attenuation changes in the deep white matter consistent with small vessel ischemia. Vascular: No hyperdense vessel or unexpected calcification. Skull: The skull is intact. No fracture or focal lesion identified. Sinuses/Orbits: The visualized paranasal sinuses and mastoid air cells are clear. The orbits and globes intact. Other: None IMPRESSION: No acute intracranial abnormality. Findings consistent with age related atrophy and chronic small vessel ischemia Electronically Signed   By: Prudencio Pair M.D.   On: 11/08/2020 15:58   CT GUIDED NEEDLE PLACEMENT  Result Date: 11/11/2020 INDICATION: No known primary, now with multiple of rest of osseous lesions worrisome for metastatic disease. Please perform CT-guided biopsy of permeative lesion involving the anterior aspect of the right ilium for  tissue diagnostic purposes. Note, patient also found to have malignant-appearing retroperitoneal lymph nodes however given the size and location of the periaortic lymph nodes decision was made to initially proceed with CT-guided bone lesion biopsy in hopes this biopsy will provided tissue diagnosis in this patient with history of schizophrenia and uncertain ability to tolerate and/or cooperate with a CT-guided biopsy. EXAM: CT-GUIDED BIOPSY INVOLVING THE ANTERIOR ASPECT OF THE RIGHT ILIUM. MEDICATIONS: None COMPARISON:  CT the chest, abdomen and pelvis-earlier same day; pelvic MRI-11/09/2020 ANESTHESIA/SEDATION: Fentanyl 50 mcg IV; Versed 1 mg IV Sedation time: 18 minutes; The patient was continuously monitored during the procedure by the interventional radiology nurse under my direct supervision. COMPLICATIONS: None  immediate. PROCEDURE: Informed consent was obtained from the patient's family following an explanation of the procedure, risks, benefits and alternatives. The patient understands, agrees and consents for the procedure. All questions were addressed. A time out was performed prior to the initiation of the procedure. The patient was positioned supine on the CT table and a limited CT was performed for procedural planning demonstrating a permeative lesion involving the anterior aspect of the right ilium. The procedure was planned. The operative site was prepped and draped in the usual sterile fashion. Appropriate trajectory was confirmed with a 22 gauge spinal needle after the adjacent tissues were anesthetized with 1% Lidocaine with epinephrine. Next, an 11 gauge coaxial bone biopsy needle was advanced into anterior aspect of the right ilium. The inner 13 gauge coil axial bone biopsy device was then utilized to acquire the initial sample after appropriate position was confirmed with CT imaging (image 11, series 3). Finally, the outer 11 gauge bone biopsy device was utilized to acquire an additional sample  (image 15, series 3). The needle was removed and superficial hemostasis was obtained with manual compression. A dressing was applied. The patient tolerated the procedure well without immediate post procedural complication. IMPRESSION: Successful CT guided biopsy of permanent lesion involving the anterior aspect of the right ilium. PLAN: As above, CT-guided bone lesion biopsy was initially pursued given patient's history of schizophrenia and uncertain ability to tolerate and/or cooperate with CT-guided biopsy however ultimately if additional tissue is required attempted CT-guided retroperitoneal lymph node biopsy could be performed as indicated. Electronically Signed   By: Sandi Mariscal M.D.   On: 11/11/2020 11:21   MR BRAIN W WO CONTRAST  Result Date: 11/12/2020 CLINICAL DATA:  Altered mental status. Recently discovered metastatic disease. EXAM: MRI HEAD WITHOUT AND WITH CONTRAST TECHNIQUE: Multiplanar, multiecho pulse sequences of the brain and surrounding structures were obtained without and with intravenous contrast. CONTRAST:  33mL GADAVIST GADOBUTROL 1 MMOL/ML IV SOLN COMPARISON:  Head CT 11/08/2020 FINDINGS: The study is motion degraded throughout including moderate to severe motion on postcontrast imaging. Brain: There are at least 11 small rounded foci of diffusion weighted signal abnormality involving cortex/gray-white junction predominantly posteriorly in the right greater than left cerebral hemispheres. The largest measures 1.5 cm in the high posterior right frontal lobe with mild surrounding edema. There is also evidence of mild edema surrounding a right temporal-occipital lesion. Assessment for enhancement is severely limited by motion artifact, however there is the suggestion of some enhancement of the dominant lesion in the right frontal lobe (series 20, image 10). No gross intracranial hemorrhage, midline shift, or extra-axial fluid collection is identified. There is moderate cerebral atrophy.  Periventricular white matter T2 hyperintensities are nonspecific but compatible with minimal chronic small vessel ischemic disease. No definite lesions are identified in the posterior fossa. Vascular: Major intracranial vascular flow voids are preserved. Skull and upper cervical spine: Abnormal marrow signal in the C2 vertebral body and posterior elements, dens, and posterior C3 vertebral body. Sinuses/Orbits: Bilateral cataract extraction. Trace right maxillary sinus mucosal thickening. Small bilateral mastoid effusions. Other: None. IMPRESSION: 1. Severely motion degraded examination. 2. Multiple small brain lesions with at most mild edema most consistent with metastatic disease. No mass effect. 3. Abnormal marrow signal in the upper cervical spine concerning for metastatic disease. Electronically Signed   By: Logan Bores M.D.   On: 11/12/2020 13:00   MR PELVIS W WO CONTRAST  Result Date: 11/10/2020 CLINICAL DATA:  Right pelvic bone lesion on CT. EXAM: MRI  PELVIS WITHOUT AND WITH CONTRAST TECHNIQUE: Multiplanar multisequence MR imaging of the pelvis was performed both before and after administration of intravenous contrast. CONTRAST:  34mL GADAVIST GADOBUTROL 1 MMOL/ML IV SOLN COMPARISON:  CT abdomen pelvis dated November 08, 2020. FINDINGS: Musculoskeletal: Large marrow replacing lesion within the right iliac bone extending into the acetabulum with associated periosteal reaction and surrounding reactive edema in the gluteus and iliacus muscles. Additional marrow replacing lesions in the left and right sacral ala (series 11, images 13 and 15) and S1 vertebral body (series 11, image 11. No definite soft tissue mass. No acute fracture or dislocation. Urinary Tract:  Foley catheter within the decompressed bladder. Bowel:  Unremarkable visualized pelvic bowel loops. Vascular/Lymphatic: No pathologically enlarged lymph nodes. No significant vascular abnormality seen. Reproductive:  No mass or other significant  abnormality. Other:  None. IMPRESSION: 1. Multiple aggressive appearing marrow replacing lesions in the pelvis, the largest involving the right iliac bone and acetabulum. 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 Electronically Signed   By: Titus Dubin M.D.   On: 11/10/2020 12:59   CT CHEST ABDOMEN PELVIS W CONTRAST  Result Date: 11/11/2020 CLINICAL DATA:  Gastrointestinal cancer. Osseous metastatic disease. Initial staging examination EXAM: CT CHEST, ABDOMEN, AND PELVIS WITH CONTRAST TECHNIQUE: Multidetector CT imaging of the chest, abdomen and pelvis was performed following the standard protocol during bolus administration of intravenous contrast. CONTRAST:  164mL OMNIPAQUE IOHEXOL 300 MG/ML  SOLN COMPARISON:  Noncontrast CT abdomen pelvis 11/08/2020, FINDINGS: CT CHEST FINDINGS Cardiovascular: Extensive multi-vessel coronary artery calcification. Global cardiac size within normal limits. No pericardial effusion. The central pulmonary arteries are of normal caliber. Mild atherosclerotic calcification within the thoracic aorta. No aortic aneurysm. Mediastinum/Nodes: There is necrotic appearing left hilar and subcarinal adenopathy. By example, left hilar adenopathy measures 2.3 x 2.5 cm at axial image # 26/2. Subcarinal adenopathy measures 1.5 x 2.9 cm at axial image # 30/2. The esophagus is unremarkable. Lungs/Pleura: Mild centrilobular emphysema. Mild bibasilar atelectasis. No focal pulmonary nodules or infiltrates. No pneumothorax or pleural effusion. Left hilar adenopathy results in circumferential narrowing of the left lower lobar pulmonary bronchi. Musculoskeletal: Pathologic appearing compression fracture of T6 with near vertebral plana deformity. Minimal retropulsion of the posteroinferior component of the T6 vertebral body resulting in minimal central canal stenosis. Mixed lytic and sclerotic pattern within the a T3 vertebral body may reflect the presence of underlying metastatic disease in  this location. No definite cortical erosion. No associated pathologic fracture. CT ABDOMEN PELVIS FINDINGS Hepatobiliary: No focal liver abnormality is seen. No gallstones, gallbladder wall thickening, or biliary dilatation. Pancreas: Unremarkable Spleen: Unremarkable Adrenals/Urinary Tract: The adrenal glands are unremarkable. The kidneys are normal in size and position. Multiple cortical hypodensities are seen within the kidneys bilaterally which are too small to characterize. A a 17 mm cortical hypodensity, however, within the posterior interpolar region of the left kidney appears solid in nature and may represent a hypoenhancing mass, but is not optimally characterized on this exam. No hydronephrosis. No intrarenal or ureteral calculi. The bladder is unremarkable. Stomach/Bowel: There is polypoid fold thickening within the a mid body of the stomach, best seen on axial image # 53/2 and sagittal image # 122/6 and an underlying gastric mass is difficult to exclude. The stomach, small bowel, and large bowel are otherwise unremarkable save for moderate stool seen throughout the colon. No evidence of obstruction or focal inflammation. The appendix is not clearly identified and may be absent. No free intraperitoneal gas or fluid. Vascular/Lymphatic: Extensive aortoiliac  atherosclerotic calcification. No aortic aneurysm. There is pathologic retroperitoneal adenopathy within the retrocrural, left periaortic, aortocaval, and retrocaval lymph node groups as well as the common iliac lymph node groups bilaterally. Index lymph node within the left periaortic lymph node group measures 1.9 x 2.0 cm at axial image # 74. Reproductive: Uterus and bilateral adnexa are unremarkable. Other: No abdominal wall hernia.  Rectum unremarkable. Musculoskeletal: Mild sclerosis involving the right ilium and left pubic symphysis. No associated pathologic fracture. With associated periosteal reaction is again identified, better assessed on  accompanying MRI examination. The majority of these lesions, however, appear radiographically occult on this exam IMPRESSION: Pathologic left hilar, subcarinal, retrocrural, and retroperitoneal lymphadenopathy. Left periaortic lymphadenopathy may provide a viable target for CT-guided biopsy for tissue sampling. Polypoid fold thickening involving the mid body of the stomach. An underlying primary gastric mass is difficult to exclude on this examination. This would be better assessed with endoscopy or upper gastrointestinal series. 17 mm hypoenhancing suspected solid mass within the left kidney. Dedicated renal sonography may be helpful, as an initial step, for further evaluation. Multiple osseous lesions in keeping with osseous metastatic disease. The majority of these lesions within the pelvis noted on prior MRI examination are radiographically occult. T6 suspected pathologic fracture. Additional possible metastasis involving the T3 vertebral body. This could be confirmed with contrast enhanced MRI examination. Extensive multi-vessel coronary artery calcification. Mild centrilobular emphysema. Aortic Atherosclerosis (ICD10-I70.0) and Emphysema (ICD10-J43.9). Electronically Signed   By: Fidela Salisbury MD   On: 11/11/2020 02:30   DG Chest Port 1 View  Result Date: 11/08/2020 CLINICAL DATA:  Questionable sepsis EXAM: PORTABLE CHEST 1 VIEW COMPARISON:  None. FINDINGS: Mild coarsening of lung markings. No focal pneumonia. No edema, effusion, or pneumothorax. Normal heart size and mediastinal contours. IMPRESSION: 1. No focal pneumonia. 2. Coarsened lung markings which could be chronic lung disease or atypical infection. Electronically Signed   By: Monte Fantasia M.D.   On: 11/08/2020 09:46   US ABDOMEN LIMITED RUQ (LIVER/GB)  Result Date: 11/08/2020 CLINICAL DATA:  Right upper quadrant pain EXAM: ULTRASOUND ABDOMEN LIMITED RIGHT UPPER QUADRANT COMPARISON:  None. FINDINGS: Gallbladder: No gallstones or wall  thickening visualized. No sonographic Murphy sign noted by sonographer. Common bile duct: Diameter: 7.5 mm, upper normal Liver: No focal lesion identified. Within normal limits in parenchymal echogenicity. Portal vein is patent on color Doppler imaging with normal direction of blood flow towards the liver. Other: None. IMPRESSION: Negative for gallstones. Common bile duct upper normal. No liver lesion. Electronically Signed   By: Franchot Gallo M.D.   On: 11/08/2020 12:45     Assessment and plan- Patient is a 80 y.o. 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  I also discussed her case with patient's brother [name] and his wife on 11/15/2020.  Biopsy results are consistent with metastatic lung cancer.  Patient could not lay flat for radiation simulation and after my conversation with Dr. Donella Stade he does not think that he can offer her palliative radiation to the brain or the back at this time.  Untreated brain metastases carry a prognosis of 1 to 3 months.  Hospice certainly seems to be a reasonable option.  We will also send off NGS testing on her peripheral blood to see if she has any actionable mutations which would qualify for targetable oral medications.  However if she does qualify for that she would not be  eligible for hospice and I explained all this to the family.  For now the plan would be to send the patient to rehab with palliative care following and monitor her progress at the rehab over the next 2 to 3 weeks.  We will have the results of NGS testing by then.  If patient continues to decline at the rehab, regardless of NGS testing results patient's family is strongly considering hospice which can be either home hospice versus transition to extended care facility with hospice or hospice home  We also discussed what the family would like to consider kyphoplasty for the T6 compression  fracture that seems to be causing patient's back pain and they are undecided about it.  For now I would recommend starting as needed morphine low-dose 5 mg every 4-6 hours as needed for palliation.   Visit Diagnosis 1. AKI (acute kidney injury) ([place])   2. RUQ abdominal pain   3. Hematuria, unspecified type   4. Tumor cells, malignant ([place])   5. Bone metastases ([place])      Dr. Randa Evens, MD, MPH [facility] at [facility] [phone] 11/15/2020 8:16 AM

## 2020-11-15 NOTE — Discharge Summary (Addendum)
Danielle Steele WJX:[id number] DOB: 1941-04-28 DOA: 11/08/2020  PCP: Abby Potash, PA-C  Admit date: 11/08/2020 Discharge date: 11/15/2020  Admitted From: SNF Disposition:  [facility]  Recommendations for Outpatient Follow-up:  1. Follow up with PCP in 1 week 2. Please obtain BMP/CBC in one week 3. Dr. Janese Banks oncology in on 4/29 at cancer center 4.Palliative to follow at SNF    Discharge Condition:Stable CODE STATUS:DNR  Diet recommendation: Regular   Brief/Interim Summary: HPI: Danielle Steele is a 80 y.o. female with medical history significant of hypertension, hyperlipidemia, prediabetes, GERD, depression, anxiety, tremor, schizophrenia, bipolar, who presents with abdominal pain, hypotension and altered mental status.Patient also noted to be progressively weak and deconditioned for last month or so.  In the emergency room, WBC count 24.1, urinalysis not very impressive for infection.  Lithium level was therapeutic.  LFTs were normal.  Lactic acid was normal.  Found to have AKI.  Urinary retention more than 750 mL urine and Foley catheter was placed.  Was admitted with leukocytosis, altered mental status with unknown cause.US-RUQ: Negative for gallstones. Common bile duct upper normal. 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 Note that there is mildly irregular periosteum in this area. An atypical bony neoplasm could potentially present in this manner. Given this appearance of the periosteum in this area, correlation with MR of the right iliac crest and acetabulum is advised to further evaluate.She had a CT of the head on admission that was negative for acute intracranial abnormality. CT chest /A/P on 4/11 with full results below, found with Multiple osseous lesions in keeping with osseous metastatic disease. The majority of these lesions within the pelvis noted on prior MRI examination are  radiographically occult. T6 suspected pathologic fracture. Additional possible metastasis involving the T3 vertebral body. This could be confirmed with contrast enhanced MRI examination.    1.Abdominal pain and leukocytosis:  Patient was found to have metastatic lesion as below.   Urine culture negative.Was treated with Rocephin empirically.  Urinary retention resolved Leukocytosis improved remained afebrile no further abdominal pain reported Sepsis ruled out   2. Altered mental status with history of multiple mental health issues including schizophrenia, bipolar, depression and anxiety: Lithium level is therapeutic.  could be from Bactrim or from polypharmacy during dehydration. CT head was normal.  Then Patient underwent MRI brain revealing metastatic dz. Was started on Decadron 4mg  bid.  Her Mental status improved and at baseline   3.Hypertension: Stable   ACE inhibitor held due to AKI and hypotension    4.Acute kidney injury: Likely due to urinary retention.  Resolved after Foley catheterization and IV fluids     Malignant lesion on the bone/braine mets On presentation a routine CT scan showed abnormal right iliac crest lesion.  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 CT scan of the chest abdomen pelvis with extensive lymphadenopathy both side of the diaphragm. CT-guided biopsy 4/11 with radiology.Marland KitchenMarland KitchenFinal path: A. BONE, RIGHT ILIUM; CT-GUIDED CORE BIOPSY: - METASTATIC ADENOCARCINOMA, TTF-1 POSITIVE, COMPATIBLE WITH LUNG ORIGIN Oncology following MRI brain will mets with edema.  Started on Decadron 4 mg twice daily Flow cytometry was ordered by Dr. Janese Banks Radiation oncology was consulted for palliative radiation, plan for CT simulation for tomorrow 4/14-initiated radiation/simulation, but Patient could not lay flat for radiation simulation,. Radiation oncologist does not think that he can offer  her palliative radiation to the brain or the back  at this time.   Per oncology:  NGS testing on her peripheral blood to see if she has any actionable mutations which would qualify for targetable oral medications.  However if she does qualify for that she would not be eligible for hospice and this was explained   to the family.For now the plan would be to send the patient to rehab with palliative care following and monitor her progress at the rehab over the next 2 to 3 weeks.  We will have the results of NGS testing by then.  If patient continues to decline at the rehab, regardless of NGS testing results patient's family is strongly considering hospice which can be either home hospice versus transition to extended care facility with hospice or hospice home. - Biopsy results are consistent with metastatic lung cancer.  Leukocytosis possibly from malignancy.  Could be reactive secondary to lithium.  Flow cytometry was checked by oncology  Family updated about transfer to SNF today.     Discharge Diagnoses:  Principal Problem:   Abdominal pain Active Problems:   UTI (urinary tract infection)   Sepsis (HCC)   Schizophrenia (HCC)   Bipolar disorder (HCC)   Depression   Anxiety   Hypertension   HLD (hyperlipidemia)   AKI (acute kidney injury) (HCC)   Abnormal CT scan   Acute metabolic encephalopathy   Bone metastases (HCC)   Lymphadenopathy   Palliative care encounter   Brain metastases ([place])   Goals of care, counseling/discussion   Primary malignant neoplasm of lung metastatic to other site [facility])    Discharge Instructions  Discharge Instructions    Diet - low sodium heart healthy   Complete by: As directed    Increase activity slowly   Complete by: As directed      Allergies as of 11/15/2020      Reactions   Citrus Rash      Medication List    STOP taking these medications   lidocaine 5 % Commonly known as: Lidoderm   lisinopril 5 MG tablet Commonly known as:  ZESTRIL   sulfamethoxazole-trimethoprim 800-160 MG tablet Commonly known as: BACTRIM DS     TAKE these medications   acetaminophen 325 MG tablet Commonly known as: TYLENOL Take 650 mg by mouth every 4 (four) hours as needed for mild pain or moderate pain.   atorvastatin 20 MG tablet Commonly known as: LIPITOR Take 20 mg by mouth every evening.   barrier cream Crea Commonly known as: non-specified Apply 1 application topically as needed (apply topically after toileting as needed to prevent skin breakdown).   benztropine 0.5 MG tablet Commonly known as: COGENTIN Take 0.5 mg by mouth 2 (two) times daily.   buPROPion 150 MG 24 hr tablet Commonly known as: WELLBUTRIN XL Take 150 mg by mouth every morning.   cetirizine 10 MG tablet Commonly known as: ZYRTEC Take 10 mg by mouth daily.   clonazePAM 0.5 MG tablet Commonly known as: KLONOPIN Take 1 tablet (0.5 mg total) by mouth 2 (two) times daily as needed for anxiety. What changed:   when to take this  reasons to take this   Cranberry 250 MG Caps Take 500 mg by mouth daily.   [employer] Vitamin D3 25 MCG (1000 UT) Chew Generic drug: Cholecalciferol Chew 2,000 Units by mouth in the morning. What changed: Another medication with the same name was removed. Continue taking this medication, and follow the directions you see here.   dexamethasone 4 MG tablet Commonly known as: DECADRON Take  1 tablet (4 mg total) by mouth every 12 (twelve) hours.   diclofenac Sodium 1 % Gel Commonly known as: VOLTAREN Apply topically.   Durezol 0.05 % Emul Generic drug: Difluprednate Apply 1 drop to eye 2 (two) times daily.   fluPHENAZine decanoate 25 MG/ML injection Commonly known as: PROLIXIN Inject 25 mg into the muscle every 30 (thirty) days.   guaiFENesin 100 MG/5ML liquid Commonly known as: ROBITUSSIN Take 300 mg by mouth every 6 (six) hours as needed for cough.   ketoconazole 2 % cream Commonly known as: NIZORAL Apply 1  application topically 2 (two) times daily.   lactobacilus acidophilus & bulgar Tabs chewable tablet Take 1 tablet by mouth daily.   lithium carbonate 150 MG capsule Take 150 mg by mouth 2 (two) times daily with a meal.   loperamide 2 MG tablet Commonly known as: IMODIUM A-D Take 4 mg by mouth as needed for diarrhea or loose stools (No more than 8 doses in 24 hours).   magnesium hydroxide 400 MG/5ML suspension Commonly known as: MILK OF MAGNESIA Take 30 mLs by mouth daily as needed for mild constipation.   MEN-PHOR EX Apply 1 application topically 2 (two) times daily as needed. What changed: Another medication with the same name was removed. Continue taking this medication, and follow the directions you see here.   saccharomyces boulardii 250 MG capsule Commonly known as: FLORASTOR Take 250 mg by mouth daily.   traZODone 50 MG tablet Commonly known as: DESYREL Take 50 mg by mouth at bedtime.       Contact information for follow-up providers    Borders, Kirt Boys, NP Follow up in 1 week(s).   Specialty: Hospice and Palliative Medicine Contact information: [place] [address] [phone]        Abby Potash, PA-C Follow up in 1 week(s).   Specialty: Physician Assistant Contact information: [place] [address] [phone]            Contact information for after-discharge care    [facility] .   Service: Skilled Nursing Contact information: [place] [phone]                 Allergies  Allergen Reactions  . Citrus Rash    Consultations:  Palliative, oncology, radiation oncology   Procedures/Studies: CT ABDOMEN PELVIS WO CONTRAST  Result Date: 11/08/2020 CLINICAL DATA:  Abdominal pain and fever EXAM: CT ABDOMEN AND PELVIS WITHOUT CONTRAST TECHNIQUE: Multidetector CT imaging of the abdomen and pelvis was  performed following the standard protocol without oral or IV contrast. COMPARISON:  None. FINDINGS: Lower chest: There is bibasilar atelectatic change. No consolidation in the lung bases. There are foci of coronary artery calcification. Hepatobiliary: No focal liver lesions are appreciable. The gallbladder appears distended. Gallbladder wall does not appear appreciably thickened by CT. No biliary duct dilatation evident. Pancreas: There is no pancreatic mass or inflammatory focus. Spleen: No splenic lesions are evident. Adrenals/Urinary Tract: There is mild adrenal hypertrophy bilaterally. No focal adrenal lesions evident. There is no appreciable renal mass or hydronephrosis on either side. There is no evident renal or ureteral calculus on either side. The urinary bladder appears distended without wall thickening. Stomach/Bowel: There is no appreciable bowel wall or mesenteric thickening. There is moderate stool and air in the colon. There is no evident bowel obstruction. Terminal ileum appears unremarkable. Appendix appears normal. No evident  free air or portal venous air. Vascular/Lymphatic: There is no abdominal aortic aneurysm. There is extensive aortic and iliac artery atherosclerotic calcification. No adenopathy is evident in the abdomen or pelvis. Reproductive: Uterus is mildly canted to the left. No adnexal masses are evident. Other: No evident abscess or ascites in the abdomen or pelvis. Musculoskeletal: Relative sclerosis noted in the right iliac bone in a portion of the right acetabulum with mildly irregular periosteum, likely due to Paget's disease. No lytic or destructive lesions are evident. There is degenerative change in each pubic symphysis. No intramuscular lesions evident. IMPRESSION: 1. 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 Note that there is mildly irregular periosteum in this area. An atypical bony neoplasm could potentially present in this manner. Given this  appearance of the periosteum in this area, correlation with MR of the right iliac crest and acetabulum is advised to further evaluate. 2. Gallbladder is mildly distended without overt wall thickening by CT. No biliary duct dilatation. Correlation with ultrasound of the gallbladder may be advisable in this circumstance. 3. Urinary bladder distended without wall thickening. No renal or ureteral calculus. No hydronephrosis on either side. 4. No evident bowel obstruction. No abscess in the abdomen or pelvis. Appendix region appears normal. 5. Aortic Atherosclerosis (ICD10-I70.0). Foci of coronary artery and iliac artery atherosclerotic calcification noted. 6. Mild adrenal hypertrophy bilaterally without focal adrenal lesion. Significance of mild adrenal hypertrophy is uncertain in this age group. Electronically Signed   By: Lowella Grip III M.D.   On: 11/08/2020 10:30   CT HEAD WO CONTRAST  Result Date: 11/08/2020 CLINICAL DATA:  Mental status change EXAM: CT HEAD WITHOUT CONTRAST TECHNIQUE: Contiguous axial images were obtained from the base of the skull through the vertex without intravenous contrast. COMPARISON:  October 10, 2020 FINDINGS: Brain: No evidence of acute territorial infarction, hemorrhage, hydrocephalus,extra-axial collection or mass lesion/mass effect. There is dilatation the ventricles and sulci consistent with age-related atrophy. Low-attenuation changes in the deep white matter consistent with small vessel ischemia. Vascular: No hyperdense vessel or unexpected calcification. Skull: The skull is intact. No fracture or focal lesion identified. Sinuses/Orbits: The visualized paranasal sinuses and mastoid air cells are clear. The orbits and globes intact. Other: None IMPRESSION: No acute intracranial abnormality. Findings consistent with age related atrophy and chronic small vessel ischemia Electronically Signed   By: Prudencio Pair M.D.   On: 11/08/2020 15:58   CT GUIDED NEEDLE PLACEMENT  Result  Date: 11/11/2020 INDICATION: No known primary, now with multiple of rest of osseous lesions worrisome for metastatic disease. Please perform CT-guided biopsy of permeative lesion involving the anterior aspect of the right ilium for tissue diagnostic purposes. Note, patient also found to have malignant-appearing retroperitoneal lymph nodes however given the size and location of the periaortic lymph nodes decision was made to initially proceed with CT-guided bone lesion biopsy in hopes this biopsy will provided tissue diagnosis in this patient with history of schizophrenia and uncertain ability to tolerate and/or cooperate with a CT-guided biopsy. EXAM: CT-GUIDED BIOPSY INVOLVING THE ANTERIOR ASPECT OF THE RIGHT ILIUM. MEDICATIONS: None COMPARISON:  CT the chest, abdomen and pelvis-earlier same day; pelvic MRI-11/09/2020 ANESTHESIA/SEDATION: Fentanyl 50 mcg IV; Versed 1 mg IV Sedation time: 18 minutes; The patient was continuously monitored during the procedure by the interventional radiology nurse under my direct supervision. COMPLICATIONS: None immediate. PROCEDURE: Informed consent was obtained from the patient's family following an explanation of the procedure, risks, benefits and alternatives. The patient understands, agrees and consents  for the procedure. All questions were addressed. A time out was performed prior to the initiation of the procedure. The patient was positioned supine on the CT table and a limited CT was performed for procedural planning demonstrating a permeative lesion involving the anterior aspect of the right ilium. The procedure was planned. The operative site was prepped and draped in the usual sterile fashion. Appropriate trajectory was confirmed with a 22 gauge spinal needle after the adjacent tissues were anesthetized with 1% Lidocaine with epinephrine. Next, an 11 gauge coaxial bone biopsy needle was advanced into anterior aspect of the right ilium. The inner 13 gauge coil axial bone  biopsy device was then utilized to acquire the initial sample after appropriate position was confirmed with CT imaging (image 11, series 3). Finally, the outer 11 gauge bone biopsy device was utilized to acquire an additional sample (image 15, series 3). The needle was removed and superficial hemostasis was obtained with manual compression. A dressing was applied. The patient tolerated the procedure well without immediate post procedural complication. IMPRESSION: Successful CT guided biopsy of permanent lesion involving the anterior aspect of the right ilium. PLAN: As above, CT-guided bone lesion biopsy was initially pursued given patient's history of schizophrenia and uncertain ability to tolerate and/or cooperate with CT-guided biopsy however ultimately if additional tissue is required attempted CT-guided retroperitoneal lymph node biopsy could be performed as indicated. Electronically Signed   By: Sandi Mariscal M.D.   On: 11/11/2020 11:21   MR BRAIN W WO CONTRAST  Result Date: 11/12/2020 CLINICAL DATA:  Altered mental status. Recently discovered metastatic disease. EXAM: MRI HEAD WITHOUT AND WITH CONTRAST TECHNIQUE: Multiplanar, multiecho pulse sequences of the brain and surrounding structures were obtained without and with intravenous contrast. CONTRAST:  1mL GADAVIST GADOBUTROL 1 MMOL/ML IV SOLN COMPARISON:  Head CT 11/08/2020 FINDINGS: The study is motion degraded throughout including moderate to severe motion on postcontrast imaging. Brain: There are at least 11 small rounded foci of diffusion weighted signal abnormality involving cortex/gray-white junction predominantly posteriorly in the right greater than left cerebral hemispheres. The largest measures 1.5 cm in the high posterior right frontal lobe with mild surrounding edema. There is also evidence of mild edema surrounding a right temporal-occipital lesion. Assessment for enhancement is severely limited by motion artifact, however there is the  suggestion of some enhancement of the dominant lesion in the right frontal lobe (series 20, image 10). No gross intracranial hemorrhage, midline shift, or extra-axial fluid collection is identified. There is moderate cerebral atrophy. Periventricular white matter T2 hyperintensities are nonspecific but compatible with minimal chronic small vessel ischemic disease. No definite lesions are identified in the posterior fossa. Vascular: Major intracranial vascular flow voids are preserved. Skull and upper cervical spine: Abnormal marrow signal in the C2 vertebral body and posterior elements, dens, and posterior C3 vertebral body. Sinuses/Orbits: Bilateral cataract extraction. Trace right maxillary sinus mucosal thickening. Small bilateral mastoid effusions. Other: None. IMPRESSION: 1. Severely motion degraded examination. 2. Multiple small brain lesions with at most mild edema most consistent with metastatic disease. No mass effect. 3. Abnormal marrow signal in the upper cervical spine concerning for metastatic disease. Electronically Signed   By: Logan Bores M.D.   On: 11/12/2020 13:00   MR PELVIS W WO CONTRAST  Result Date: 11/10/2020 CLINICAL DATA:  Right pelvic bone lesion on CT. EXAM: MRI PELVIS WITHOUT AND WITH CONTRAST TECHNIQUE: Multiplanar multisequence MR imaging of the pelvis was performed both before and after administration of intravenous contrast. CONTRAST:  68mL  GADAVIST GADOBUTROL 1 MMOL/ML IV SOLN COMPARISON:  CT abdomen pelvis dated November 08, 2020. FINDINGS: Musculoskeletal: Large marrow replacing lesion within the right iliac bone extending into the acetabulum with associated periosteal reaction and surrounding reactive edema in the gluteus and iliacus muscles. Additional marrow replacing lesions in the left and right sacral ala (series 11, images 13 and 15) and S1 vertebral body (series 11, image 11. No definite soft tissue mass. No acute fracture or dislocation. Urinary Tract:  Foley catheter  within the decompressed bladder. Bowel:  Unremarkable visualized pelvic bowel loops. Vascular/Lymphatic: No pathologically enlarged lymph nodes. No significant vascular abnormality seen. Reproductive:  No mass or other significant abnormality. Other:  None. IMPRESSION: 1. Multiple aggressive appearing marrow replacing lesions in the pelvis, the largest involving the right iliac bone and acetabulum. 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 Electronically Signed   By: Titus Dubin M.D.   On: 11/10/2020 12:59   CT CHEST ABDOMEN PELVIS W CONTRAST  Result Date: 11/11/2020 CLINICAL DATA:  Gastrointestinal cancer. Osseous metastatic disease. Initial staging examination EXAM: CT CHEST, ABDOMEN, AND PELVIS WITH CONTRAST TECHNIQUE: Multidetector CT imaging of the chest, abdomen and pelvis was performed following the standard protocol during bolus administration of intravenous contrast. CONTRAST:  122mL OMNIPAQUE IOHEXOL 300 MG/ML  SOLN COMPARISON:  Noncontrast CT abdomen pelvis 11/08/2020, FINDINGS: CT CHEST FINDINGS Cardiovascular: Extensive multi-vessel coronary artery calcification. Global cardiac size within normal limits. No pericardial effusion. The central pulmonary arteries are of normal caliber. Mild atherosclerotic calcification within the thoracic aorta. No aortic aneurysm. Mediastinum/Nodes: There is necrotic appearing left hilar and subcarinal adenopathy. By example, left hilar adenopathy measures 2.3 x 2.5 cm at axial image # 26/2. Subcarinal adenopathy measures 1.5 x 2.9 cm at axial image # 30/2. The esophagus is unremarkable. Lungs/Pleura: Mild centrilobular emphysema. Mild bibasilar atelectasis. No focal pulmonary nodules or infiltrates. No pneumothorax or pleural effusion. Left hilar adenopathy results in circumferential narrowing of the left lower lobar pulmonary bronchi. Musculoskeletal: Pathologic appearing compression fracture of T6 with near vertebral plana deformity. Minimal retropulsion  of the posteroinferior component of the T6 vertebral body resulting in minimal central canal stenosis. Mixed lytic and sclerotic pattern within the a T3 vertebral body may reflect the presence of underlying metastatic disease in this location. No definite cortical erosion. No associated pathologic fracture. CT ABDOMEN PELVIS FINDINGS Hepatobiliary: No focal liver abnormality is seen. No gallstones, gallbladder wall thickening, or biliary dilatation. Pancreas: Unremarkable Spleen: Unremarkable Adrenals/Urinary Tract: The adrenal glands are unremarkable. The kidneys are normal in size and position. Multiple cortical hypodensities are seen within the kidneys bilaterally which are too small to characterize. A a 17 mm cortical hypodensity, however, within the posterior interpolar region of the left kidney appears solid in nature and may represent a hypoenhancing mass, but is not optimally characterized on this exam. No hydronephrosis. No intrarenal or ureteral calculi. The bladder is unremarkable. Stomach/Bowel: There is polypoid fold thickening within the a mid body of the stomach, best seen on axial image # 53/2 and sagittal image # 122/6 and an underlying gastric mass is difficult to exclude. The stomach, small bowel, and large bowel are otherwise unremarkable save for moderate stool seen throughout the colon. No evidence of obstruction or focal inflammation. The appendix is not clearly identified and may be absent. No free intraperitoneal gas or fluid. Vascular/Lymphatic: Extensive aortoiliac atherosclerotic calcification. No aortic aneurysm. There is pathologic retroperitoneal adenopathy within the retrocrural, left periaortic, aortocaval, and retrocaval lymph node groups as well as the common  iliac lymph node groups bilaterally. Index lymph node within the left periaortic lymph node group measures 1.9 x 2.0 cm at axial image # 74. Reproductive: Uterus and bilateral adnexa are unremarkable. Other: No abdominal wall  hernia.  Rectum unremarkable. Musculoskeletal: Mild sclerosis involving the right ilium and left pubic symphysis. No associated pathologic fracture. With associated periosteal reaction is again identified, better assessed on accompanying MRI examination. The majority of these lesions, however, appear radiographically occult on this exam IMPRESSION: Pathologic left hilar, subcarinal, retrocrural, and retroperitoneal lymphadenopathy. Left periaortic lymphadenopathy may provide a viable target for CT-guided biopsy for tissue sampling. Polypoid fold thickening involving the mid body of the stomach. An underlying primary gastric mass is difficult to exclude on this examination. This would be better assessed with endoscopy or upper gastrointestinal series. 17 mm hypoenhancing suspected solid mass within the left kidney. Dedicated renal sonography may be helpful, as an initial step, for further evaluation. Multiple osseous lesions in keeping with osseous metastatic disease. The majority of these lesions within the pelvis noted on prior MRI examination are radiographically occult. T6 suspected pathologic fracture. Additional possible metastasis involving the T3 vertebral body. This could be confirmed with contrast enhanced MRI examination. Extensive multi-vessel coronary artery calcification. Mild centrilobular emphysema. Aortic Atherosclerosis (ICD10-I70.0) and Emphysema (ICD10-J43.9). Electronically Signed   By: Fidela Salisbury MD   On: 11/11/2020 02:30   DG Chest Port 1 View  Result Date: 11/08/2020 CLINICAL DATA:  Questionable sepsis EXAM: PORTABLE CHEST 1 VIEW COMPARISON:  None. FINDINGS: Mild coarsening of lung markings. No focal pneumonia. No edema, effusion, or pneumothorax. Normal heart size and mediastinal contours. IMPRESSION: 1. No focal pneumonia. 2. Coarsened lung markings which could be chronic lung disease or atypical infection. Electronically Signed   By: Monte Fantasia M.D.   On: 11/08/2020 09:46   US  ABDOMEN LIMITED RUQ (LIVER/GB)  Result Date: 11/08/2020 CLINICAL DATA:  Right upper quadrant pain EXAM: ULTRASOUND ABDOMEN LIMITED RIGHT UPPER QUADRANT COMPARISON:  None. FINDINGS: Gallbladder: No gallstones or wall thickening visualized. No sonographic Murphy sign noted by sonographer. Common bile duct: Diameter: 7.5 mm, upper normal Liver: No focal lesion identified. Within normal limits in parenchymal echogenicity. Portal vein is patent on color Doppler imaging with normal direction of blood flow towards the liver. Other: None. IMPRESSION: Negative for gallstones. Common bile duct upper normal. No liver lesion. Electronically Signed   By: Franchot Gallo M.D.   On: 11/08/2020 12:45       Subjective: No complaints from pt.  Discharge Exam: Vitals:   11/15/20 0344 11/15/20 0849  BP: (!) 142/74 130/64  Pulse: 77 80  Resp: 16 16  Temp: (!) 97.5 F (36.4 C) 98.1 F (36.7 C)  SpO2: 94% 95%   Vitals:   11/14/20 1552 11/14/20 1951 11/15/20 0344 11/15/20 0849  BP: 111/61 127/81 (!) 142/74 130/64  Pulse: 76 84 77 80  Resp: 20 16 16 16   Temp: 98.5 F (36.9 C) 98.1 F (36.7 C) (!) 97.5 F (36.4 C) 98.1 F (36.7 C)  TempSrc:  Oral Oral Oral  SpO2: 94% 92% 94% 95%  Weight:      Height:        General: Pt is alert, awake, not in acute distress Cardiovascular: RRR, S1/S2 +, no rubs, no gallops Respiratory: CTA bilaterally, no wheezing, no rhonchi Abdominal: Soft, NT, ND, bowel sounds + Extremities: no edema    The results of significant diagnostics from this hospitalization (including imaging, microbiology, ancillary and laboratory) are listed below for  reference.     Microbiology: Recent Results (from the past 240 hour(s))  Urine culture     Status: None   Collection Time: 11/08/20  9:13 AM   Specimen: In/Out Cath Urine  Result Value Ref Range Status   Specimen Description   Final    IN/OUT CATH URINE Performed at [facility], [address], [place], [place]  [address]    Special Requests   Final    NONE Performed at [facility], [address] [place], [place] [address]    Culture   Final    NO GROWTH Performed at [facility], [place] [address], [place], [place] [address]    Report Status 11/09/2020 FINAL  Final  Resp Panel by RT-PCR (Flu A&B, Covid) Nasopharyngeal Swab     Status: None   Collection Time: 11/08/20 10:34 AM   Specimen: Nasopharyngeal Swab; Nasopharyngeal(NP) swabs in vial transport medium  Result Value Ref Range Status   SARS Coronavirus 2 by RT PCR NEGATIVE NEGATIVE Final    Comment: (NOTE) SARS-CoV-2 target nucleic acids are NOT DETECTED.  The SARS-CoV-2 RNA is generally detectable in upper respiratory specimens during the acute phase of infection. The lowest concentration of SARS-CoV-2 viral copies this assay can detect is 138 copies/mL. A negative result does not preclude SARS-Cov-2 infection and should not be used as the sole basis for treatment or other patient management decisions. A negative result may occur with  improper specimen collection/handling, submission of specimen other than nasopharyngeal swab, presence of viral mutation(s) within the areas targeted by this assay, and inadequate number of viral copies(<138 copies/mL). A negative result must be combined with clinical observations, patient history, and epidemiological information. The expected result is Negative.  Fact Sheet for Patients:  EntrepreneurPulse.com.au  Fact Sheet for Healthcare Providers:  IncredibleEmployment.be  This test is no t yet approved or cleared by the [place] FDA and  has been authorized for detection and/or diagnosis of SARS-CoV-2 by FDA under an Emergency Use Authorization (EUA). This EUA will remain  in effect (meaning this test can be used) for the duration of the COVID-19 declaration under Section 564(b)(1) of the Act, 21 U.S.C.section 360bbb-3(b)(1), unless the  authorization is terminated  or revoked sooner.       Influenza A by PCR NEGATIVE NEGATIVE Final   Influenza B by PCR NEGATIVE NEGATIVE Final    Comment: (NOTE) The Xpert Xpress SARS-CoV-2/FLU/RSV plus assay is intended as an aid in the diagnosis of influenza from Nasopharyngeal swab specimens and should not be used as a sole basis for treatment. Nasal washings and aspirates are unacceptable for Xpert Xpress SARS-CoV-2/FLU/RSV testing.  Fact Sheet for Patients: EntrepreneurPulse.com.au  Fact Sheet for Healthcare Providers: IncredibleEmployment.be  This test is not yet approved or cleared by the [place] FDA and has been authorized for detection and/or diagnosis of SARS-CoV-2 by FDA under an Emergency Use Authorization (EUA). This EUA will remain in effect (meaning this test can be used) for the duration of the COVID-19 declaration under Section 564(b)(1) of the Act, 21 U.S.C. section 360bbb-3(b)(1), unless the authorization is terminated or revoked.  Performed at [facility], [address] [place], [place] [address]   CULTURE, BLOOD (ROUTINE X 2) w Reflex to ID Panel     Status: None   Collection Time: 11/08/20  1:20 PM   Specimen: BLOOD  Result Value Ref Range Status   Specimen Description BLOOD RIGHT ANTECUBITAL  Final   Special Requests  Final    BOTTLES DRAWN AEROBIC AND ANAEROBIC Blood Culture adequate volume   Culture   Final    NO GROWTH 5 DAYS Performed at [facility], [address] [place], [place] [address]    Report Status 11/13/2020 FINAL  Final  CULTURE, BLOOD (ROUTINE X 2) w Reflex to ID Panel     Status: None   Collection Time: 11/08/20  1:20 PM   Specimen: BLOOD  Result Value Ref Range Status   Specimen Description BLOOD LEFT ANTECUBITAL  Final   Special Requests   Final    BOTTLES DRAWN AEROBIC AND ANAEROBIC Blood Culture adequate volume   Culture   Final    NO GROWTH 5  DAYS Performed at [facility], [address] [place], [place] [address]    Report Status 11/13/2020 FINAL  Final  MRSA PCR Screening     Status: None   Collection Time: 11/08/20  9:23 PM   Specimen: Nasal Mucosa; Nasopharyngeal  Result Value Ref Range Status   MRSA by PCR NEGATIVE NEGATIVE Final    Comment:        The GeneXpert MRSA Assay (FDA approved for NASAL specimens only), is one component of a comprehensive MRSA colonization surveillance program. It is not intended to diagnose MRSA infection nor to guide or monitor treatment for MRSA infections. Performed at [facility], [place], [place] [address]   SARS CORONAVIRUS 2 (TAT 6-24 HRS) Nasopharyngeal Nasopharyngeal Swab     Status: None   Collection Time: 11/11/20  5:57 PM   Specimen: Nasopharyngeal Swab  Result Value Ref Range Status   SARS Coronavirus 2 NEGATIVE NEGATIVE Final    Comment: (NOTE) SARS-CoV-2 target nucleic acids are NOT DETECTED.  The SARS-CoV-2 RNA is generally detectable in upper and lower respiratory specimens during the acute phase of infection. Negative results do not preclude SARS-CoV-2 infection, do not rule out co-infections with other pathogens, and should not be used as the sole basis for treatment or other patient management decisions. Negative results must be combined with clinical observations, patient history, and epidemiological information. The expected result is Negative.  Fact Sheet for Patients: SugarRoll.be  Fact Sheet for Healthcare Providers: https://www.woods-mathews.com/  This test is not yet approved or cleared by the [place] FDA and  has been authorized for detection and/or diagnosis of SARS-CoV-2 by FDA under an Emergency Use Authorization (EUA). This EUA will remain  in effect (meaning this test can be used) for the duration of the COVID-19 declaration under Se ction 564(b)(1) of the  Act, 21 U.S.C. section 360bbb-3(b)(1), unless the authorization is terminated or revoked sooner.  Performed at [facility], [place] [address], [place], [place] [address]      Labs: BNP (last 3 results) No results for input(s): BNP in the last 8760 hours. Basic Metabolic Panel: Recent Labs  Lab 11/09/20 0601 11/10/20 0422  NA 138 140  K 4.7 3.7  CL 113* 112*  CO2 20* 21*  GLUCOSE 126* 100*  BUN 13 14  CREATININE 1.04* 0.96  CALCIUM 9.2 9.8  MG  --  2.2  PHOS  --  2.1*   Liver Function Tests: Recent Labs  Lab 11/10/20 0422  AST 16  ALT 11  ALKPHOS 93  BILITOT 0.4  PROT 6.2*  ALBUMIN 2.8*   No results for input(s): LIPASE, AMYLASE in the last 168 hours. No results for input(s): AMMONIA in the last 168 hours. CBC: Recent Labs  Lab 11/09/20  0601 11/10/20 0422 11/11/20 0831 11/13/20 0918  WBC 21.3* 21.4* 24.1* 22.2*  NEUTROABS  --  16.4* 18.9*  --   HGB 9.8* 10.3* 11.8* 11.9*  HCT 30.8* 32.5* 35.3* 35.9*  MCV 98.1 98.2 94.9 95.2  PLT 250 304 318 326   Cardiac Enzymes: No results for input(s): CKTOTAL, CKMB, CKMBINDEX, TROPONINI in the last 168 hours. BNP: Invalid input(s): POCBNP CBG: No results for input(s): GLUCAP in the last 168 hours. D-Dimer No results for input(s): DDIMER in the last 72 hours. Hgb A1c No results for input(s): HGBA1C in the last 72 hours. Lipid Profile No results for input(s): CHOL, HDL, LDLCALC, TRIG, CHOLHDL, LDLDIRECT in the last 72 hours. Thyroid function studies No results for input(s): TSH, T4TOTAL, T3FREE, THYROIDAB in the last 72 hours.  Invalid input(s): FREET3 Anemia work up No results for input(s): VITAMINB12, FOLATE, FERRITIN, TIBC, IRON, RETICCTPCT in the last 72 hours. Urinalysis    Component Value Date/Time   COLORURINE YELLOW (A) 11/08/2020 0913   APPEARANCEUR HAZY (A) 11/08/2020 0913   LABSPEC 1.012 11/08/2020 0913   PHURINE 6.0 11/08/2020 0913   GLUCOSEU NEGATIVE 11/08/2020 0913   HGBUR LARGE (A)  11/08/2020 0913   BILIRUBINUR NEGATIVE 11/08/2020 0913   KETONESUR NEGATIVE 11/08/2020 0913   PROTEINUR NEGATIVE 11/08/2020 0913   NITRITE NEGATIVE 11/08/2020 0913   LEUKOCYTESUR NEGATIVE 11/08/2020 0913   Sepsis Labs Invalid input(s): PROCALCITONIN,  WBC,  LACTICIDVEN Microbiology Recent Results (from the past 240 hour(s))  Urine culture     Status: None   Collection Time: 11/08/20  9:13 AM   Specimen: In/Out Cath Urine  Result Value Ref Range Status   Specimen Description   Final    IN/OUT CATH URINE Performed at [facility], [address], [place], [place] [address]    Special Requests   Final    NONE Performed at [facility], [address] [place], [place] [address]    Culture   Final    NO GROWTH Performed at [facility], [place] [address], [place], [place] [address]    Report Status 11/09/2020 FINAL  Final  Resp Panel by RT-PCR (Flu A&B, Covid) Nasopharyngeal Swab     Status: None   Collection Time: 11/08/20 10:34 AM   Specimen: Nasopharyngeal Swab; Nasopharyngeal(NP) swabs in vial transport medium  Result Value Ref Range Status   SARS Coronavirus 2 by RT PCR NEGATIVE NEGATIVE Final    Comment: (NOTE) SARS-CoV-2 target nucleic acids are NOT DETECTED.  The SARS-CoV-2 RNA is generally detectable in upper respiratory specimens during the acute phase of infection. The lowest concentration of SARS-CoV-2 viral copies this assay can detect is 138 copies/mL. A negative result does not preclude SARS-Cov-2 infection and should not be used as the sole basis for treatment or other patient management decisions. A negative result may occur with  improper specimen collection/handling, submission of specimen other than nasopharyngeal swab, presence of viral mutation(s) within the areas targeted by this assay, and inadequate number of viral copies(<138 copies/mL). A negative result must be combined with clinical observations, patient history, and  epidemiological information. The expected result is Negative.  Fact Sheet for Patients:  EntrepreneurPulse.com.au  Fact Sheet for Healthcare Providers:  IncredibleEmployment.be  This test is no t yet approved or cleared by the [place] FDA and  has been authorized for detection and/or diagnosis of SARS-CoV-2 by FDA under an Emergency Use Authorization (EUA). This EUA will remain  in effect (meaning this test can be used)  for the duration of the COVID-19 declaration under Section 564(b)(1) of the Act, 21 U.S.C.section 360bbb-3(b)(1), unless the authorization is terminated  or revoked sooner.       Influenza A by PCR NEGATIVE NEGATIVE Final   Influenza B by PCR NEGATIVE NEGATIVE Final    Comment: (NOTE) The Xpert Xpress SARS-CoV-2/FLU/RSV plus assay is intended as an aid in the diagnosis of influenza from Nasopharyngeal swab specimens and should not be used as a sole basis for treatment. Nasal washings and aspirates are unacceptable for Xpert Xpress SARS-CoV-2/FLU/RSV testing.  Fact Sheet for Patients: EntrepreneurPulse.com.au  Fact Sheet for Healthcare Providers: IncredibleEmployment.be  This test is not yet approved or cleared by the [place] FDA and has been authorized for detection and/or diagnosis of SARS-CoV-2 by FDA under an Emergency Use Authorization (EUA). This EUA will remain in effect (meaning this test can be used) for the duration of the COVID-19 declaration under Section 564(b)(1) of the Act, 21 U.S.C. section 360bbb-3(b)(1), unless the authorization is terminated or revoked.  Performed at [facility], [address] [place], [place] [address]   CULTURE, BLOOD (ROUTINE X 2) w Reflex to ID Panel     Status: None   Collection Time: 11/08/20  1:20 PM   Specimen: BLOOD  Result Value Ref Range Status   Specimen Description BLOOD RIGHT ANTECUBITAL  Final   Special  Requests   Final    BOTTLES DRAWN AEROBIC AND ANAEROBIC Blood Culture adequate volume   Culture   Final    NO GROWTH 5 DAYS Performed at [facility], [address] [place], [place] [address]    Report Status 11/13/2020 FINAL  Final  CULTURE, BLOOD (ROUTINE X 2) w Reflex to ID Panel     Status: None   Collection Time: 11/08/20  1:20 PM   Specimen: BLOOD  Result Value Ref Range Status   Specimen Description BLOOD LEFT ANTECUBITAL  Final   Special Requests   Final    BOTTLES DRAWN AEROBIC AND ANAEROBIC Blood Culture adequate volume   Culture   Final    NO GROWTH 5 DAYS Performed at [facility], [address] [place], [place] [address]    Report Status 11/13/2020 FINAL  Final  MRSA PCR Screening     Status: None   Collection Time: 11/08/20  9:23 PM   Specimen: Nasal Mucosa; Nasopharyngeal  Result Value Ref Range Status   MRSA by PCR NEGATIVE NEGATIVE Final    Comment:        The GeneXpert MRSA Assay (FDA approved for NASAL specimens only), is one component of a comprehensive MRSA colonization surveillance program. It is not intended to diagnose MRSA infection nor to guide or monitor treatment for MRSA infections. Performed at [facility], [place], [place] [address]   SARS CORONAVIRUS 2 (TAT 6-24 HRS) Nasopharyngeal Nasopharyngeal Swab     Status: None   Collection Time: 11/11/20  5:57 PM   Specimen: Nasopharyngeal Swab  Result Value Ref Range Status   SARS Coronavirus 2 NEGATIVE NEGATIVE Final    Comment: (NOTE) SARS-CoV-2 target nucleic acids are NOT DETECTED.  The SARS-CoV-2 RNA is generally detectable in upper and lower respiratory specimens during the acute phase of infection. Negative results do not preclude SARS-CoV-2 infection, do not rule out co-infections with other pathogens, and should not be used as the sole basis for treatment or other patient management decisions. Negative results must be combined with  clinical observations, patient history, and  epidemiological information. The expected result is Negative.  Fact Sheet for Patients: SugarRoll.be  Fact Sheet for Healthcare Providers: https://www.woods-mathews.com/  This test is not yet approved or cleared by the [place] FDA and  has been authorized for detection and/or diagnosis of SARS-CoV-2 by FDA under an Emergency Use Authorization (EUA). This EUA will remain  in effect (meaning this test can be used) for the duration of the COVID-19 declaration under Se ction 564(b)(1) of the Act, 21 U.S.C. section 360bbb-3(b)(1), unless the authorization is terminated or revoked sooner.  Performed at [facility], [place] [address] [place], [place] [address]      Time coordinating discharge: Over 30 minutes  SIGNED:   Nolberto Hanlon, MD  [facility] 11/15/2020, 10:35 AM Pager   If 7PM-7AM, please contact night-coverage [email or web address] Password [email or web address]

## 2020-11-15 NOTE — Care Management Important Message (Signed)
Important Message  Patient Details  Name: Danielle Steele MRN: [id number] Date of Birth: 09-20-1940   Medicare Important Message Given:  Yes     [name] 11/15/2020, 1:22 PM

## 2020-11-15 NOTE — Progress Notes (Signed)
Report called to Caryl Pina, Therapist, sports at [facility]. Pt to be d/c to facility. Covid test negative. PIV removed. AVS summary completed.

## 2020-11-15 NOTE — Progress Notes (Signed)
[facility]) Hospital Liaison RN note:  Received new referral for [facility] out patient palliative program to follow post discharge from [facility], TOC. Patient information given to referral. Plan is to discharge to [facility] today for STR. [facility] Liaison will follow for disposition.  Thank you for this referral.  Zandra Abts, RN [facility] Liaison [phone]

## 2020-11-15 NOTE — Progress Notes (Signed)
[place]  [phone] [phone]   Name: Danielle Steele Date: 11/15/2020 MRN: [id number]  DOB: 1941/04/05  Patient Care Team: Ann Held as PCP - General (Physician Assistant)    REASON FOR CONSULTATION: Danielle Steele is a 80 y.o. female with multiple medical problems including schizophrenia, bipolar disorder, hypertension, hyperlipidemia, and depression, who was admitted to the hospital on 11/08/2020 with altered mental status.  Work-up including CT of the chest, abdomen, and pelvis revealed hilar/subcarinal/retroperitoneal adenopathy, pathologic T6 compression fracture, mixed lytic and sclerotic lesions, and left renal mass.  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  She underwent bone biopsy.  Palliative care was referred to address goals and manage ongoing symptoms..   CODE STATUS: DNR  PAST MEDICAL HISTORY: Past Medical History:  Diagnosis Date  . Anxiety   . Bipolar disorder ([place])   . Depression   . GERD (gastroesophageal reflux disease)   . Hypertension   . PND (post-nasal drip)    CAUSES SOME COUGH  . Pre-diabetes   . Schizophrenia ([place])    SCHIZO  AFFECTIVE DISORDER  . Thyroid nodule   . Tremors of nervous system     PAST SURGICAL HISTORY:  Past Surgical History:  Procedure Laterality Date  . CATARACT EXTRACTION W/PHACO Left 09/07/2017   Procedure: CATARACT EXTRACTION PHACO AND INTRAOCULAR LENS PLACEMENT (IOC);  Surgeon: Birder Robson, MD;  Location: [facility] ORS;  Service: Ophthalmology;  Laterality: Left;  [place] 00:39.9 AP% 17.4 CDE 6.94 Fluid Pack lot # [id number]  . CATARACT EXTRACTION W/PHACO Right 09/28/2017   Procedure: CATARACT EXTRACTION PHACO AND INTRAOCULAR LENS PLACEMENT (IOC);  Surgeon: Birder Robson, MD;  Location: [facility] ORS;  Service: Ophthalmology;  Laterality: Right;  [place] 01:31.8 AP% 13.7 CDE 12.55 Fluid Pack Lot #  [id number]   . EYE SURGERY    . TONSILLECTOMY      HEMATOLOGY/ONCOLOGY HISTORY:  Oncology History   No history exists.    ALLERGIES:  is allergic to citrus.  MEDICATIONS:  Current Facility-Administered Medications  Medication Dose Route Frequency Provider Last Rate Last Admin  . 0.9 %  sodium chloride infusion   Intravenous PRN Barb Merino, MD   Stopped at 11/10/20 1133  . acetaminophen (TYLENOL) tablet 650 mg  650 mg Oral Q6H PRN Ivor Costa, MD   650 mg at 11/14/20 1309  . acidophilus (RISAQUAD) capsule 1 capsule  1 capsule Oral Daily Barb Merino, MD   1 capsule at 11/15/20 0850  . atorvastatin (LIPITOR) tablet 20 mg  20 mg Oral QPM Ivor Costa, MD   20 mg at 11/14/20 1708  . benztropine (COGENTIN) tablet 0.5 mg  0.5 mg Oral BID Ivor Costa, MD   0.5 mg at 11/15/20 0853  . buPROPion (WELLBUTRIN XL) 24 hr tablet 150 mg  150 mg Oral Jaye Beagle, Soledad Gerlach, MD   150 mg at 11/15/20 0850  . cholecalciferol (VITAMIN D) tablet 2,000 Units  2,000 Units Oral q AM Ivor Costa, MD   2,000 Units at 11/15/20 [id number]  . clonazePAM (KLONOPIN) tablet 0.5 mg  0.5 mg Oral BID PRN Barb Merino, MD      . dexamethasone (DECADRON) tablet 4 mg  4 mg Oral Q12H Zelena Bushong, Kirt Boys, NP   4 mg at 11/15/20 0350  . enoxaparin (LOVENOX) injection 40 mg  40 mg Subcutaneous Q24H Lu Duffel, RPH   40 mg at 11/15/20 0853  . guaiFENesin (ROBITUSSIN) 100 MG/5ML solution 300 mg  300 mg Oral Q6H PRN Ivor Costa, MD      . lithium carbonate capsule 150 mg  150 mg Oral BID WC Ivor Costa, MD   150 mg at 11/15/20 0853  . loperamide (IMODIUM) capsule 4 mg  4 mg Oral PRN Ivor Costa, MD      . loratadine (CLARITIN) tablet 10 mg  10 mg Oral Daily Ivor Costa, MD   10 mg at 11/15/20 0851  . magnesium hydroxide (MILK OF MAGNESIA) suspension 30 mL  30 mL Oral Daily PRN Ivor Costa, MD   30 mL at 11/12/20 2018  . morphine 2 MG/ML injection 0.5 mg  0.5 mg Intravenous Q4H PRN Ivor Costa, MD   0.5 mg at 11/14/20 2040  . ondansetron  (ZOFRAN) injection 4 mg  4 mg Intravenous Q8H PRN Ivor Costa, MD      . traZODone (DESYREL) tablet 50 mg  50 mg Oral QHS Ivor Costa, MD   50 mg at 11/14/20 2026    VITAL SIGNS: BP 130/64 (BP Location: Left Arm)   Pulse 80   Temp 98.1 F (36.7 C) (Oral)   Resp 16   Ht 5\' 5"  (1.651 m)   Wt 110 lb (49.9 kg)   SpO2 95%   BMI 18.30 kg/m  Filed Weights   11/08/20 0907  Weight: 110 lb (49.9 kg)    Estimated body mass index is 18.3 kg/m as calculated from the following:   Height as of this encounter: 5\' 5"  (1.651 m).   Weight as of this encounter: 110 lb (49.9 kg).  LABS: CBC:    Component Value Date/Time   WBC 22.2 (H) 11/13/2020 0918   HGB 11.9 (L) 11/13/2020 0918   HCT 35.9 (L) 11/13/2020 0918   PLT 326 11/13/2020 0918   MCV 95.2 11/13/2020 0918   NEUTROABS 18.9 (H) 11/11/2020 0831   LYMPHSABS 1.1 11/11/2020 0831   MONOABS 1.9 (H) 11/11/2020 0831   EOSABS 1.7 (H) 11/11/2020 0831   BASOSABS 0.1 11/11/2020 0831   Comprehensive Metabolic Panel:    Component Value Date/Time   NA 140 11/10/2020 0422   K 3.7 11/10/2020 0422   CL 112 (H) 11/10/2020 0422   CO2 21 (L) 11/10/2020 0422   BUN 14 11/10/2020 0422   CREATININE 0.96 11/10/2020 0422   GLUCOSE 100 (H) 11/10/2020 0422   CALCIUM 9.8 11/10/2020 0422   AST 16 11/10/2020 0422   ALT 11 11/10/2020 0422   ALKPHOS 93 11/10/2020 0422   BILITOT 0.4 11/10/2020 0422   PROT 6.2 (L) 11/10/2020 0422   ALBUMIN 2.8 (L) 11/10/2020 0422    RADIOGRAPHIC STUDIES: CT ABDOMEN PELVIS WO CONTRAST  Result Date: 11/08/2020 CLINICAL DATA:  Abdominal pain and fever EXAM: CT ABDOMEN AND PELVIS WITHOUT CONTRAST TECHNIQUE: Multidetector CT imaging of the abdomen and pelvis was performed following the standard protocol without oral or IV contrast. COMPARISON:  None. FINDINGS: Lower chest: There is bibasilar atelectatic change. No consolidation in the lung bases. There are foci of coronary artery calcification. Hepatobiliary: No focal liver  lesions are appreciable. The gallbladder appears distended. Gallbladder wall does not appear appreciably thickened by CT. No biliary duct dilatation evident. Pancreas: There is no pancreatic mass or inflammatory focus. Spleen: No splenic lesions are evident. Adrenals/Urinary Tract: There is mild adrenal hypertrophy bilaterally. No focal adrenal lesions evident. There is no appreciable renal mass or hydronephrosis on either side. There is no evident renal or ureteral calculus on either side. The urinary bladder appears distended without wall thickening.  Stomach/Bowel: There is no appreciable bowel wall or mesenteric thickening. There is moderate stool and air in the colon. There is no evident bowel obstruction. Terminal ileum appears unremarkable. Appendix appears normal. No evident free air or portal venous air. Vascular/Lymphatic: There is no abdominal aortic aneurysm. There is extensive aortic and iliac artery atherosclerotic calcification. No adenopathy is evident in the abdomen or pelvis. Reproductive: Uterus is mildly canted to the left. No adnexal masses are evident. Other: No evident abscess or ascites in the abdomen or pelvis. Musculoskeletal: Relative sclerosis noted in the right iliac bone in a portion of the right acetabulum with mildly irregular periosteum, likely due to Paget's disease. No lytic or destructive lesions are evident. There is degenerative change in each pubic symphysis. No intramuscular lesions evident. IMPRESSION: 1. 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 Note that there is mildly irregular periosteum in this area. An atypical bony neoplasm could potentially present in this manner. Given this appearance of the periosteum in this area, correlation with MR of the right iliac crest and acetabulum is advised to further evaluate. 2. Gallbladder is mildly distended without overt wall thickening by CT. No biliary duct dilatation. Correlation with ultrasound of  the gallbladder may be advisable in this circumstance. 3. Urinary bladder distended without wall thickening. No renal or ureteral calculus. No hydronephrosis on either side. 4. No evident bowel obstruction. No abscess in the abdomen or pelvis. Appendix region appears normal. 5. Aortic Atherosclerosis (ICD10-I70.0). Foci of coronary artery and iliac artery atherosclerotic calcification noted. 6. Mild adrenal hypertrophy bilaterally without focal adrenal lesion. Significance of mild adrenal hypertrophy is uncertain in this age group. Electronically Signed   By: Lowella Grip III M.D.   On: 11/08/2020 10:30   CT HEAD WO CONTRAST  Result Date: 11/08/2020 CLINICAL DATA:  Mental status change EXAM: CT HEAD WITHOUT CONTRAST TECHNIQUE: Contiguous axial images were obtained from the base of the skull through the vertex without intravenous contrast. COMPARISON:  October 10, 2020 FINDINGS: Brain: No evidence of acute territorial infarction, hemorrhage, hydrocephalus,extra-axial collection or mass lesion/mass effect. There is dilatation the ventricles and sulci consistent with age-related atrophy. Low-attenuation changes in the deep white matter consistent with small vessel ischemia. Vascular: No hyperdense vessel or unexpected calcification. Skull: The skull is intact. No fracture or focal lesion identified. Sinuses/Orbits: The visualized paranasal sinuses and mastoid air cells are clear. The orbits and globes intact. Other: None IMPRESSION: No acute intracranial abnormality. Findings consistent with age related atrophy and chronic small vessel ischemia Electronically Signed   By: Prudencio Pair M.D.   On: 11/08/2020 15:58   CT GUIDED NEEDLE PLACEMENT  Result Date: 11/11/2020 INDICATION: No known primary, now with multiple of rest of osseous lesions worrisome for metastatic disease. Please perform CT-guided biopsy of permeative lesion involving the anterior aspect of the right ilium for tissue diagnostic purposes. Note,  patient also found to have malignant-appearing retroperitoneal lymph nodes however given the size and location of the periaortic lymph nodes decision was made to initially proceed with CT-guided bone lesion biopsy in hopes this biopsy will provided tissue diagnosis in this patient with history of schizophrenia and uncertain ability to tolerate and/or cooperate with a CT-guided biopsy. EXAM: CT-GUIDED BIOPSY INVOLVING THE ANTERIOR ASPECT OF THE RIGHT ILIUM. MEDICATIONS: None COMPARISON:  CT the chest, abdomen and pelvis-earlier same day; pelvic MRI-11/09/2020 ANESTHESIA/SEDATION: Fentanyl 50 mcg IV; Versed 1 mg IV Sedation time: 18 minutes; The patient was continuously monitored during the procedure by the interventional  radiology nurse under my direct supervision. COMPLICATIONS: None immediate. PROCEDURE: Informed consent was obtained from the patient's family following an explanation of the procedure, risks, benefits and alternatives. The patient understands, agrees and consents for the procedure. All questions were addressed. A time out was performed prior to the initiation of the procedure. The patient was positioned supine on the CT table and a limited CT was performed for procedural planning demonstrating a permeative lesion involving the anterior aspect of the right ilium. The procedure was planned. The operative site was prepped and draped in the usual sterile fashion. Appropriate trajectory was confirmed with a 22 gauge spinal needle after the adjacent tissues were anesthetized with 1% Lidocaine with epinephrine. Next, an 11 gauge coaxial bone biopsy needle was advanced into anterior aspect of the right ilium. The inner 13 gauge coil axial bone biopsy device was then utilized to acquire the initial sample after appropriate position was confirmed with CT imaging (image 11, series 3). Finally, the outer 11 gauge bone biopsy device was utilized to acquire an additional sample (image 15, series 3). The needle  was removed and superficial hemostasis was obtained with manual compression. A dressing was applied. The patient tolerated the procedure well without immediate post procedural complication. IMPRESSION: Successful CT guided biopsy of permanent lesion involving the anterior aspect of the right ilium. PLAN: As above, CT-guided bone lesion biopsy was initially pursued given patient's history of schizophrenia and uncertain ability to tolerate and/or cooperate with CT-guided biopsy however ultimately if additional tissue is required attempted CT-guided retroperitoneal lymph node biopsy could be performed as indicated. Electronically Signed   By: Sandi Mariscal M.D.   On: 11/11/2020 11:21   MR BRAIN W WO CONTRAST  Result Date: 11/12/2020 CLINICAL DATA:  Altered mental status. Recently discovered metastatic disease. EXAM: MRI HEAD WITHOUT AND WITH CONTRAST TECHNIQUE: Multiplanar, multiecho pulse sequences of the brain and surrounding structures were obtained without and with intravenous contrast. CONTRAST:  21mL GADAVIST GADOBUTROL 1 MMOL/ML IV SOLN COMPARISON:  Head CT 11/08/2020 FINDINGS: The study is motion degraded throughout including moderate to severe motion on postcontrast imaging. Brain: There are at least 11 small rounded foci of diffusion weighted signal abnormality involving cortex/gray-white junction predominantly posteriorly in the right greater than left cerebral hemispheres. The largest measures 1.5 cm in the high posterior right frontal lobe with mild surrounding edema. There is also evidence of mild edema surrounding a right temporal-occipital lesion. Assessment for enhancement is severely limited by motion artifact, however there is the suggestion of some enhancement of the dominant lesion in the right frontal lobe (series 20, image 10). No gross intracranial hemorrhage, midline shift, or extra-axial fluid collection is identified. There is moderate cerebral atrophy. Periventricular white matter T2  hyperintensities are nonspecific but compatible with minimal chronic small vessel ischemic disease. No definite lesions are identified in the posterior fossa. Vascular: Major intracranial vascular flow voids are preserved. Skull and upper cervical spine: Abnormal marrow signal in the C2 vertebral body and posterior elements, dens, and posterior C3 vertebral body. Sinuses/Orbits: Bilateral cataract extraction. Trace right maxillary sinus mucosal thickening. Small bilateral mastoid effusions. Other: None. IMPRESSION: 1. Severely motion degraded examination. 2. Multiple small brain lesions with at most mild edema most consistent with metastatic disease. No mass effect. 3. Abnormal marrow signal in the upper cervical spine concerning for metastatic disease. Electronically Signed   By: Logan Bores M.D.   On: 11/12/2020 13:00   MR PELVIS W WO CONTRAST  Result Date: 11/10/2020 CLINICAL DATA:  Right pelvic bone lesion on CT. EXAM: MRI PELVIS WITHOUT AND WITH CONTRAST TECHNIQUE: Multiplanar multisequence MR imaging of the pelvis was performed both before and after administration of intravenous contrast. CONTRAST:  5mL GADAVIST GADOBUTROL 1 MMOL/ML IV SOLN COMPARISON:  CT abdomen pelvis dated November 08, 2020. FINDINGS: Musculoskeletal: Large marrow replacing lesion within the right iliac bone extending into the acetabulum with associated periosteal reaction and surrounding reactive edema in the gluteus and iliacus muscles. Additional marrow replacing lesions in the left and right sacral ala (series 11, images 13 and 15) and S1 vertebral body (series 11, image 11. No definite soft tissue mass. No acute fracture or dislocation. Urinary Tract:  Foley catheter within the decompressed bladder. Bowel:  Unremarkable visualized pelvic bowel loops. Vascular/Lymphatic: No pathologically enlarged lymph nodes. No significant vascular abnormality seen. Reproductive:  No mass or other significant abnormality. Other:  None. IMPRESSION:  1. Multiple aggressive appearing marrow replacing lesions in the pelvis, the largest involving the right iliac bone and acetabulum. 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 Electronically Signed   By: Titus Dubin M.D.   On: 11/10/2020 12:59   CT CHEST ABDOMEN PELVIS W CONTRAST  Result Date: 11/11/2020 CLINICAL DATA:  Gastrointestinal cancer. Osseous metastatic disease. Initial staging examination EXAM: CT CHEST, ABDOMEN, AND PELVIS WITH CONTRAST TECHNIQUE: Multidetector CT imaging of the chest, abdomen and pelvis was performed following the standard protocol during bolus administration of intravenous contrast. CONTRAST:  140mL OMNIPAQUE IOHEXOL 300 MG/ML  SOLN COMPARISON:  Noncontrast CT abdomen pelvis 11/08/2020, FINDINGS: CT CHEST FINDINGS Cardiovascular: Extensive multi-vessel coronary artery calcification. Global cardiac size within normal limits. No pericardial effusion. The central pulmonary arteries are of normal caliber. Mild atherosclerotic calcification within the thoracic aorta. No aortic aneurysm. Mediastinum/Nodes: There is necrotic appearing left hilar and subcarinal adenopathy. By example, left hilar adenopathy measures 2.3 x 2.5 cm at axial image # 26/2. Subcarinal adenopathy measures 1.5 x 2.9 cm at axial image # 30/2. The esophagus is unremarkable. Lungs/Pleura: Mild centrilobular emphysema. Mild bibasilar atelectasis. No focal pulmonary nodules or infiltrates. No pneumothorax or pleural effusion. Left hilar adenopathy results in circumferential narrowing of the left lower lobar pulmonary bronchi. Musculoskeletal: Pathologic appearing compression fracture of T6 with near vertebral plana deformity. Minimal retropulsion of the posteroinferior component of the T6 vertebral body resulting in minimal central canal stenosis. Mixed lytic and sclerotic pattern within the a T3 vertebral body may reflect the presence of underlying metastatic disease in this location. No definite cortical  erosion. No associated pathologic fracture. CT ABDOMEN PELVIS FINDINGS Hepatobiliary: No focal liver abnormality is seen. No gallstones, gallbladder wall thickening, or biliary dilatation. Pancreas: Unremarkable Spleen: Unremarkable Adrenals/Urinary Tract: The adrenal glands are unremarkable. The kidneys are normal in size and position. Multiple cortical hypodensities are seen within the kidneys bilaterally which are too small to characterize. A a 17 mm cortical hypodensity, however, within the posterior interpolar region of the left kidney appears solid in nature and may represent a hypoenhancing mass, but is not optimally characterized on this exam. No hydronephrosis. No intrarenal or ureteral calculi. The bladder is unremarkable. Stomach/Bowel: There is polypoid fold thickening within the a mid body of the stomach, best seen on axial image # 53/2 and sagittal image # 122/6 and an underlying gastric mass is difficult to exclude. The stomach, small bowel, and large bowel are otherwise unremarkable save for moderate stool seen throughout the colon. No evidence of obstruction or focal inflammation. The appendix is not clearly identified and may be absent. No  free intraperitoneal gas or fluid. Vascular/Lymphatic: Extensive aortoiliac atherosclerotic calcification. No aortic aneurysm. There is pathologic retroperitoneal adenopathy within the retrocrural, left periaortic, aortocaval, and retrocaval lymph node groups as well as the common iliac lymph node groups bilaterally. Index lymph node within the left periaortic lymph node group measures 1.9 x 2.0 cm at axial image # 74. Reproductive: Uterus and bilateral adnexa are unremarkable. Other: No abdominal wall hernia.  Rectum unremarkable. Musculoskeletal: Mild sclerosis involving the right ilium and left pubic symphysis. No associated pathologic fracture. With associated periosteal reaction is again identified, better assessed on accompanying MRI examination. The  majority of these lesions, however, appear radiographically occult on this exam IMPRESSION: Pathologic left hilar, subcarinal, retrocrural, and retroperitoneal lymphadenopathy. Left periaortic lymphadenopathy may provide a viable target for CT-guided biopsy for tissue sampling. Polypoid fold thickening involving the mid body of the stomach. An underlying primary gastric mass is difficult to exclude on this examination. This would be better assessed with endoscopy or upper gastrointestinal series. 17 mm hypoenhancing suspected solid mass within the left kidney. Dedicated renal sonography may be helpful, as an initial step, for further evaluation. Multiple osseous lesions in keeping with osseous metastatic disease. The majority of these lesions within the pelvis noted on prior MRI examination are radiographically occult. T6 suspected pathologic fracture. Additional possible metastasis involving the T3 vertebral body. This could be confirmed with contrast enhanced MRI examination. Extensive multi-vessel coronary artery calcification. Mild centrilobular emphysema. Aortic Atherosclerosis (ICD10-I70.0) and Emphysema (ICD10-J43.9). Electronically Signed   By: Fidela Salisbury MD   On: 11/11/2020 02:30   DG Chest Port 1 View  Result Date: 11/08/2020 CLINICAL DATA:  Questionable sepsis EXAM: PORTABLE CHEST 1 VIEW COMPARISON:  None. FINDINGS: Mild coarsening of lung markings. No focal pneumonia. No edema, effusion, or pneumothorax. Normal heart size and mediastinal contours. IMPRESSION: 1. No focal pneumonia. 2. Coarsened lung markings which could be chronic lung disease or atypical infection. Electronically Signed   By: Monte Fantasia M.D.   On: 11/08/2020 09:46   US ABDOMEN LIMITED RUQ (LIVER/GB)  Result Date: 11/08/2020 CLINICAL DATA:  Right upper quadrant pain EXAM: ULTRASOUND ABDOMEN LIMITED RIGHT UPPER QUADRANT COMPARISON:  None. FINDINGS: Gallbladder: No gallstones or wall thickening visualized. No sonographic  Murphy sign noted by sonographer. Common bile duct: Diameter: 7.5 mm, upper normal Liver: No focal lesion identified. Within normal limits in parenchymal echogenicity. Portal vein is patent on color Doppler imaging with normal direction of blood flow towards the liver. Other: None. IMPRESSION: Negative for gallstones. Common bile duct upper normal. No liver lesion. Electronically Signed   By: Franchot Gallo M.D.   On: 11/08/2020 12:45    PERFORMANCE STATUS (ECOG) : 4 - Bedbound  Review of Systems Unless otherwise noted, a complete review of systems is negative.  Physical Exam General: NAD Pulmonary: unlabored Extremities: no edema, no joint deformities Skin: no rashes Neurological: Weakness, confusion  IMPRESSION: Patient pleasantly confused.  She endorses mild back pain but denies other symptomatic complaints.  No family currently present.  I called and spoke with her brother, [name].  He confirms plan for patient to discharge to rehab later today.  Patient was not able to tolerate simulation for XRT.  Family are interested in awaiting results of NGS testing to see if patient has any other alternative options for treatment.  Otherwise, hospice could be considered.  Palliative care plans to follow patient at rehab.  Patient has scheduled follow-up on 4/29 in the [facility].  PLAN: -Rehab with palliative care  following -[place] appt on 4/29    Time Total: 20 minutes  Visit consisted of counseling and education dealing with the complex and emotionally intense issues of symptom management and palliative care in the setting of serious and potentially life-threatening illness.Greater than 50%  of this time was spent counseling and coordinating care related to the above assessment and plan.  Signed by: Altha Harm, PhD, NP-C

## 2020-11-15 NOTE — TOC Transition Note (Addendum)
Transition of Care [facility]) - CM/SW Discharge Note   Patient Details  Name: Danielle Steele MRN: [id number] Date of Birth: July 11, 1941  Transition of Care [facility]) CM/SW Contact:  Danielle Ivan, LCSW Phone Number: 11/15/2020, 12:29 PM   Clinical Narrative:   Patient to discharge to [facility] today, Room 115 B. Confirmed with Danielle Steele at [facility]. CSW updated MD, RN, and brother Danielle Steele. Asked RN to call report and MD to submit DC Summary. Medical Necessity Form, DNR, and Face Sheet placed in Discharge Packet by patient chart. EMS transport will be arranged when DC Summary is in, COVID test is negative, and report has been called.  1:20[facility] called. RN aware and agreeable. Patient is 3rd on the list when a truck is available.   Final next level of care: Skilled Nursing Facility Barriers to Discharge: Barriers Resolved   Patient Goals and CMS Choice Patient states their goals for this hospitalization and ongoing recovery are:: SNF rehab with Outpatient Palliative CMS Medicare.gov Compare Post Acute Care list provided to:: Patient Represenative (must comment) Choice offered to / list presented to : Sibling  Discharge Placement              Patient chooses bed at: [facility] Patient to be transferred to facility by: EMS Name of family member notified: Danielle Steele - brother Patient and family notified of of transfer: 11/15/20  Discharge Plan and Services                                     Social Determinants of Health (SDOH) Interventions     Readmission Risk Interventions No flowsheet data found.

## 2020-11-17 ENCOUNTER — Other Ambulatory Visit: Payer: Self-pay

## 2020-11-17 ENCOUNTER — Emergency Department: Payer: Medicare Other

## 2020-11-17 ENCOUNTER — Inpatient Hospital Stay
Admission: EM | Admit: 2020-11-17 | Discharge: 2020-11-19 | DRG: 880 | Disposition: A | Discharge status: Hospice | Payer: Medicare Other | Source: Skilled Nursing Facility | Attending: Internal Medicine | Admitting: Internal Medicine

## 2020-11-17 ENCOUNTER — Encounter: Payer: Self-pay | Admitting: Radiology

## 2020-11-17 DIAGNOSIS — F209 Schizophrenia, unspecified: Secondary | ICD-10-CM | POA: Diagnosis present

## 2020-11-17 DIAGNOSIS — C3490 Malignant neoplasm of unspecified part of unspecified bronchus or lung: Secondary | ICD-10-CM | POA: Diagnosis present

## 2020-11-17 DIAGNOSIS — R7989 Other specified abnormal findings of blood chemistry: Secondary | ICD-10-CM

## 2020-11-17 DIAGNOSIS — C7951 Secondary malignant neoplasm of bone: Secondary | ICD-10-CM | POA: Diagnosis present

## 2020-11-17 DIAGNOSIS — F419 Anxiety disorder, unspecified: Secondary | ICD-10-CM | POA: Diagnosis present

## 2020-11-17 DIAGNOSIS — R4182 Altered mental status, unspecified: Principal | ICD-10-CM

## 2020-11-17 DIAGNOSIS — Z79891 Long term (current) use of opiate analgesic: Secondary | ICD-10-CM

## 2020-11-17 DIAGNOSIS — I10 Essential (primary) hypertension: Secondary | ICD-10-CM | POA: Diagnosis present

## 2020-11-17 DIAGNOSIS — Z515 Encounter for palliative care: Secondary | ICD-10-CM | POA: Diagnosis not present

## 2020-11-17 DIAGNOSIS — Z20822 Contact with and (suspected) exposure to covid-19: Secondary | ICD-10-CM | POA: Diagnosis present

## 2020-11-17 DIAGNOSIS — D72829 Elevated white blood cell count, unspecified: Secondary | ICD-10-CM

## 2020-11-17 DIAGNOSIS — Z79899 Other long term (current) drug therapy: Secondary | ICD-10-CM | POA: Diagnosis not present

## 2020-11-17 DIAGNOSIS — N179 Acute kidney failure, unspecified: Secondary | ICD-10-CM | POA: Diagnosis present

## 2020-11-17 DIAGNOSIS — R410 Disorientation, unspecified: Secondary | ICD-10-CM | POA: Diagnosis not present

## 2020-11-17 DIAGNOSIS — Z87891 Personal history of nicotine dependence: Secondary | ICD-10-CM | POA: Diagnosis not present

## 2020-11-17 DIAGNOSIS — C7931 Secondary malignant neoplasm of brain: Secondary | ICD-10-CM | POA: Diagnosis present

## 2020-11-17 DIAGNOSIS — R471 Dysarthria and anarthria: Secondary | ICD-10-CM | POA: Diagnosis present

## 2020-11-17 DIAGNOSIS — Z91018 Allergy to other foods: Secondary | ICD-10-CM

## 2020-11-17 DIAGNOSIS — F05 Delirium due to known physiological condition: Secondary | ICD-10-CM | POA: Diagnosis present

## 2020-11-17 DIAGNOSIS — F259 Schizoaffective disorder, unspecified: Secondary | ICD-10-CM | POA: Diagnosis present

## 2020-11-17 DIAGNOSIS — F319 Bipolar disorder, unspecified: Secondary | ICD-10-CM | POA: Diagnosis present

## 2020-11-17 DIAGNOSIS — E861 Hypovolemia: Secondary | ICD-10-CM | POA: Diagnosis present

## 2020-11-17 DIAGNOSIS — E785 Hyperlipidemia, unspecified: Secondary | ICD-10-CM | POA: Diagnosis present

## 2020-11-17 DIAGNOSIS — R7303 Prediabetes: Secondary | ICD-10-CM | POA: Diagnosis present

## 2020-11-17 DIAGNOSIS — K219 Gastro-esophageal reflux disease without esophagitis: Secondary | ICD-10-CM | POA: Diagnosis present

## 2020-11-17 DIAGNOSIS — Z66 Do not resuscitate: Secondary | ICD-10-CM | POA: Diagnosis present

## 2020-11-17 LAB — CBC WITH DIFFERENTIAL/PLATELET
Abs Immature Granulocytes: 1.17 10*3/uL — ABNORMAL HIGH (ref 0.00–0.07)
Basophils Absolute: 0.1 10*3/uL (ref 0.0–0.1)
Basophils Relative: 0 %
Eosinophils Absolute: 0.6 10*3/uL — ABNORMAL HIGH (ref 0.0–0.5)
Eosinophils Relative: 2 %
HCT: 40.2 % (ref 36.0–46.0)
Hemoglobin: 13 g/dL (ref 12.0–15.0)
Immature Granulocytes: 3 %
Lymphocytes Relative: 3 %
Lymphs Abs: 1 10*3/uL (ref 0.7–4.0)
MCH: 31.3 pg (ref 26.0–34.0)
MCHC: 32.3 g/dL (ref 30.0–36.0)
MCV: 96.6 fL (ref 80.0–100.0)
Monocytes Absolute: 2.2 10*3/uL — ABNORMAL HIGH (ref 0.1–1.0)
Monocytes Relative: 6 %
Neutro Abs: 29.9 10*3/uL — ABNORMAL HIGH (ref 1.7–7.7)
Neutrophils Relative %: 86 %
Platelets: 251 10*3/uL (ref 150–400)
RBC: 4.16 MIL/uL (ref 3.87–5.11)
RDW: 13.6 % (ref 11.5–15.5)
Smear Review: NORMAL
WBC: 35 10*3/uL — ABNORMAL HIGH (ref 4.0–10.5)
nRBC: 0.1 % (ref 0.0–0.2)

## 2020-11-17 LAB — LACTIC ACID, PLASMA: Lactic Acid, Venous: 1.6 mmol/L (ref 0.5–1.9)

## 2020-11-17 LAB — CBC
HCT: 34.4 % — ABNORMAL LOW (ref 36.0–46.0)
Hemoglobin: 11 g/dL — ABNORMAL LOW (ref 12.0–15.0)
MCH: 31.3 pg (ref 26.0–34.0)
MCHC: 32 g/dL (ref 30.0–36.0)
MCV: 98 fL (ref 80.0–100.0)
Platelets: 226 10*3/uL (ref 150–400)
RBC: 3.51 MIL/uL — ABNORMAL LOW (ref 3.87–5.11)
RDW: 13.8 % (ref 11.5–15.5)
WBC: 28.2 10*3/uL — ABNORMAL HIGH (ref 4.0–10.5)
nRBC: 0 % (ref 0.0–0.2)

## 2020-11-17 LAB — COMPREHENSIVE METABOLIC PANEL
ALT: 17 U/L (ref 0–44)
AST: 18 U/L (ref 15–41)
Albumin: 3.4 g/dL — ABNORMAL LOW (ref 3.5–5.0)
Alkaline Phosphatase: 97 U/L (ref 38–126)
Anion gap: 10 (ref 5–15)
BUN: 33 mg/dL — ABNORMAL HIGH (ref 8–23)
CO2: 23 mmol/L (ref 22–32)
Calcium: 10.4 mg/dL — ABNORMAL HIGH (ref 8.9–10.3)
Chloride: 103 mmol/L (ref 98–111)
Creatinine, Ser: 1.15 mg/dL — ABNORMAL HIGH (ref 0.44–1.00)
GFR, Estimated: 49 mL/min — ABNORMAL LOW (ref >60)
Glucose, Bld: 132 mg/dL — ABNORMAL HIGH (ref 70–99)
Potassium: 3.7 mmol/L (ref 3.5–5.1)
Sodium: 136 mmol/L (ref 135–145)
Total Bilirubin: 0.7 mg/dL (ref 0.3–1.2)
Total Protein: 7.1 g/dL (ref 6.5–8.1)

## 2020-11-17 LAB — TROPONIN I (HIGH SENSITIVITY)
Troponin I (High Sensitivity): 27 ng/L — ABNORMAL HIGH (ref <18)
Troponin I (High Sensitivity): 28 ng/L — ABNORMAL HIGH (ref <18)

## 2020-11-17 LAB — BASIC METABOLIC PANEL
Anion gap: 8 (ref 5–15)
BUN: 31 mg/dL — ABNORMAL HIGH (ref 8–23)
CO2: 25 mmol/L (ref 22–32)
Calcium: 9.5 mg/dL (ref 8.9–10.3)
Chloride: 106 mmol/L (ref 98–111)
Creatinine, Ser: 1.04 mg/dL — ABNORMAL HIGH (ref 0.44–1.00)
GFR, Estimated: 55 mL/min — ABNORMAL LOW (ref >60)
Glucose, Bld: 153 mg/dL — ABNORMAL HIGH (ref 70–99)
Potassium: 3.4 mmol/L — ABNORMAL LOW (ref 3.5–5.1)
Sodium: 139 mmol/L (ref 135–145)

## 2020-11-17 LAB — URINALYSIS, COMPLETE (UACMP) WITH MICROSCOPIC
Bilirubin Urine: NEGATIVE
Glucose, UA: NEGATIVE mg/dL
Ketones, ur: NEGATIVE mg/dL
Leukocytes,Ua: NEGATIVE
Nitrite: NEGATIVE
Protein, ur: NEGATIVE mg/dL
RBC / HPF: >50 RBC/hpf — ABNORMAL HIGH (ref 0–5)
Specific Gravity, Urine: 1.01 (ref 1.005–1.030)
pH: 6 (ref 5.0–8.0)

## 2020-11-17 LAB — RESP PANEL BY RT-PCR (FLU A&B, COVID) ARPGX2
Influenza A by PCR: NEGATIVE
Influenza B by PCR: NEGATIVE
SARS Coronavirus 2 by RT PCR: NEGATIVE

## 2020-11-17 LAB — PROCALCITONIN
Procalcitonin: 0.13 ng/mL
Procalcitonin: 0.1 ng/mL

## 2020-11-17 LAB — BRAIN NATRIURETIC PEPTIDE: B Natriuretic Peptide: 119.6 pg/mL — ABNORMAL HIGH (ref 0.0–100.0)

## 2020-11-17 LAB — PROTIME-INR
INR: 1.3 — ABNORMAL HIGH (ref 0.8–1.2)
Prothrombin Time: 15.7 seconds — ABNORMAL HIGH (ref 11.4–15.2)

## 2020-11-17 LAB — CORTISOL-AM, BLOOD: Cortisol - AM: 9.6 ug/dL (ref 6.7–22.6)

## 2020-11-17 LAB — LITHIUM LEVEL: Lithium Lvl: 0.52 mmol/L — ABNORMAL LOW (ref 0.60–1.20)

## 2020-11-17 IMAGING — CT CT HEAD W/O CM
3 of 6 series · 16 of 47 positions shown, 19 images · non-contrast
Comparison: [DATE]

CLINICAL DATA: Altered mental status

EXAM:
CT HEAD WITHOUT CONTRAST
TECHNIQUE: Contiguous axial images were obtained from the base of the skull
through the vertex without intravenous contrast.

[Series 3: head wo · axial · 0.44mm/px · z∈[-110,+35]mm · 11 of 33 slices shown, 14 images]
[im 2/33  brain]
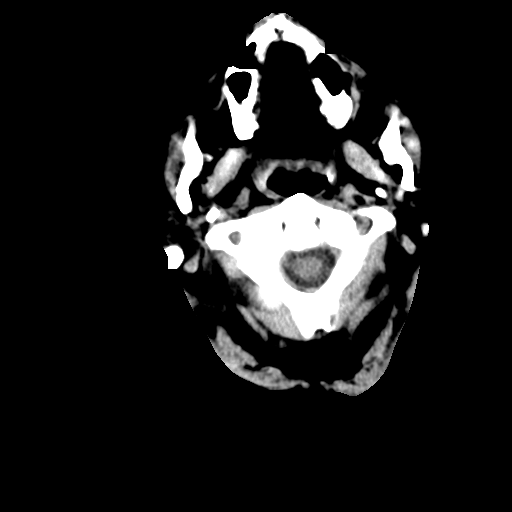
[im 2/33  bone]
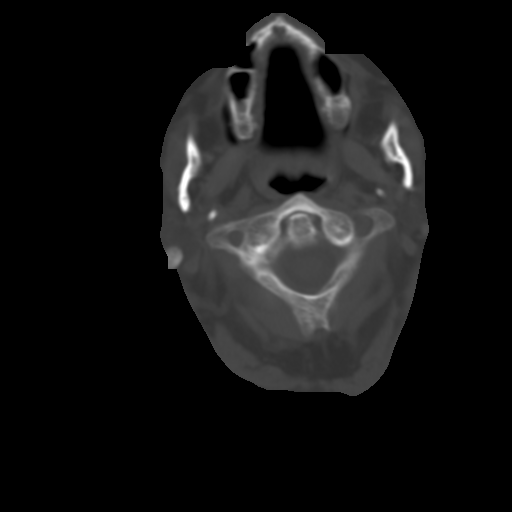
[im 6/33  brain]
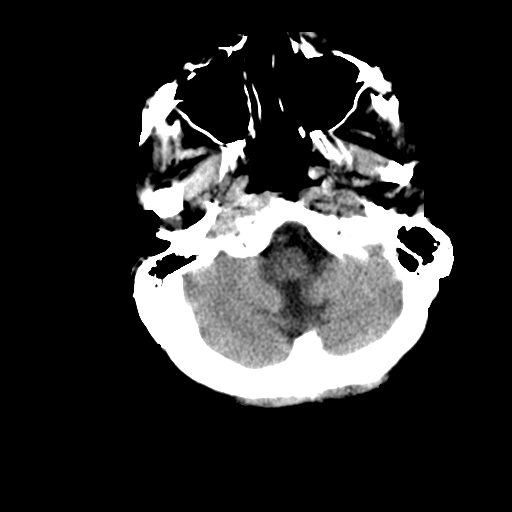
[im 9/33  brain]
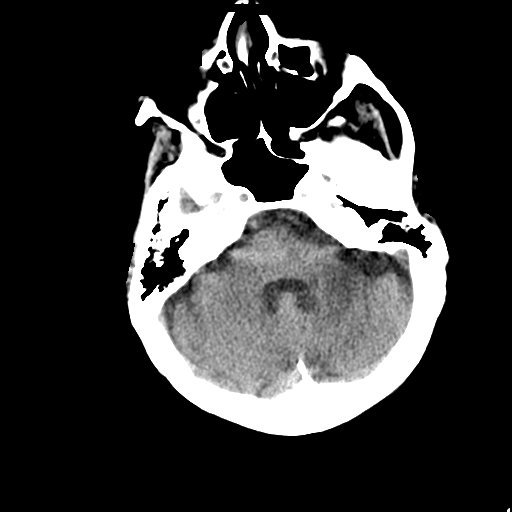
[im 11/33  brain]
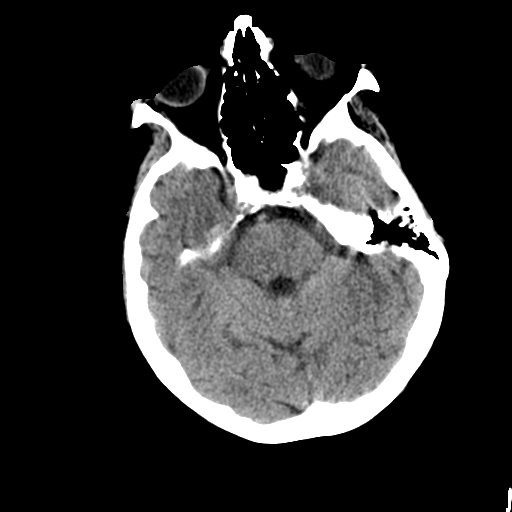
[im 14/33  brain]
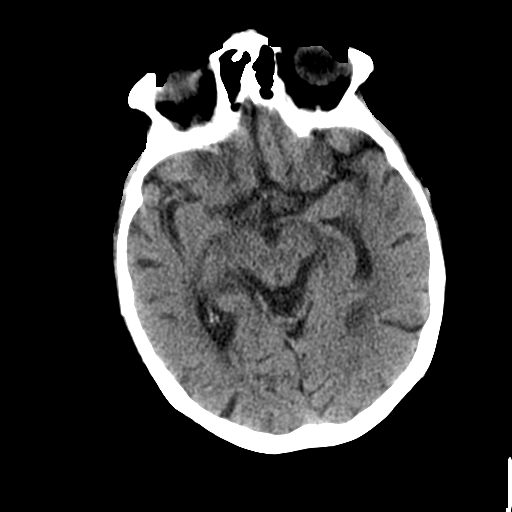
[im 14/33  bone]
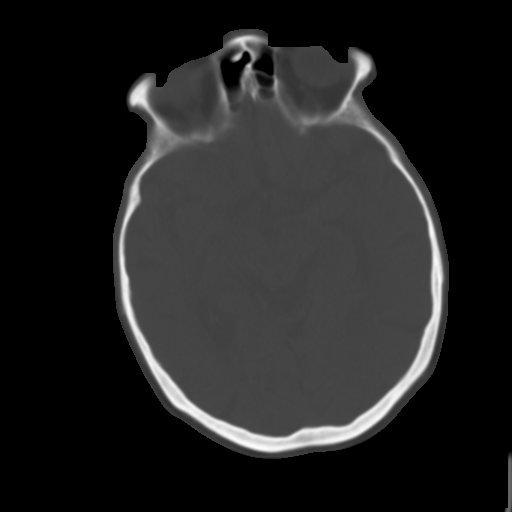
[im 17/33  brain]
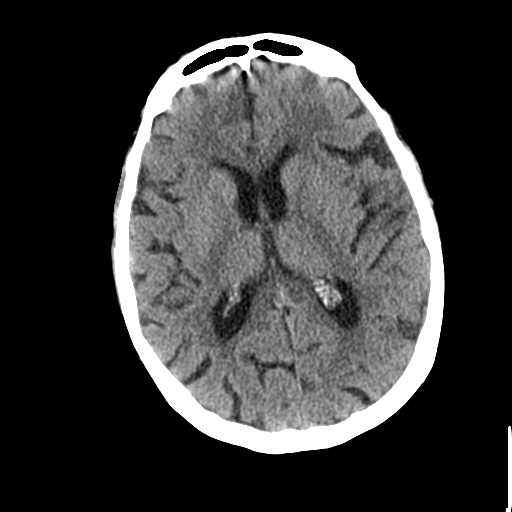
[im 19/33  brain]
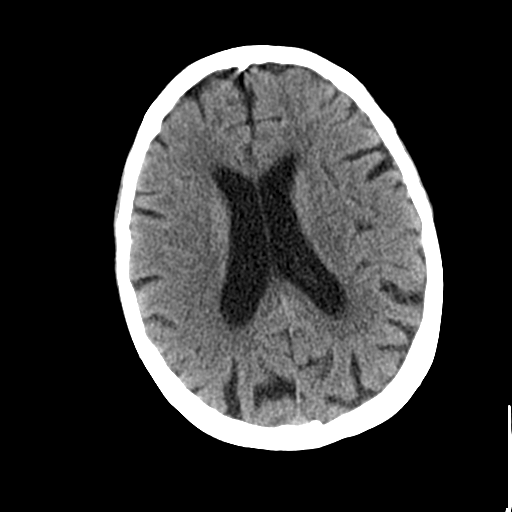
[im 22/33  brain]
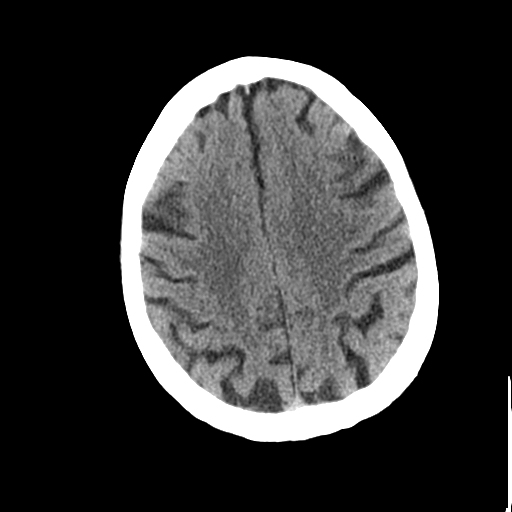
[im 24/33  brain]
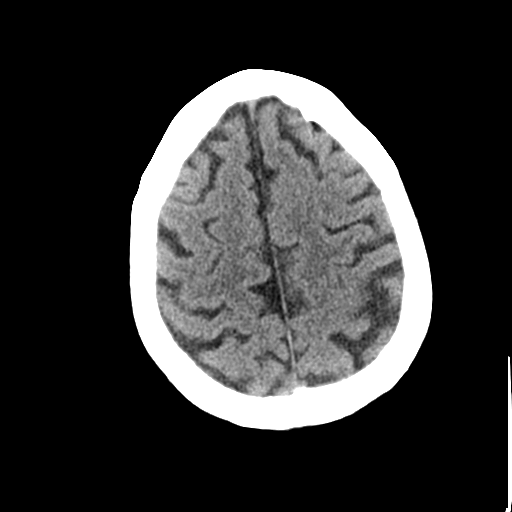
[im 24/33  bone]
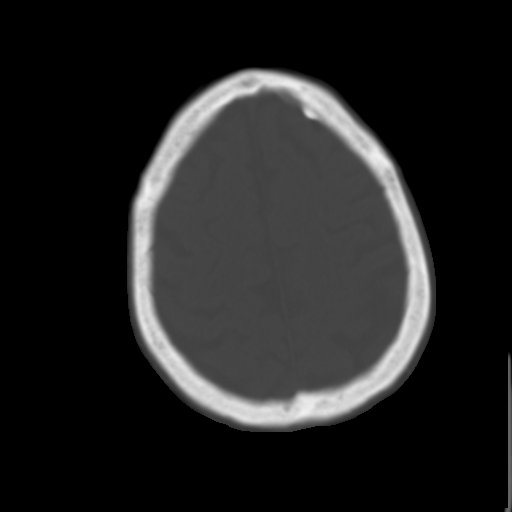
[im 27/33  brain]
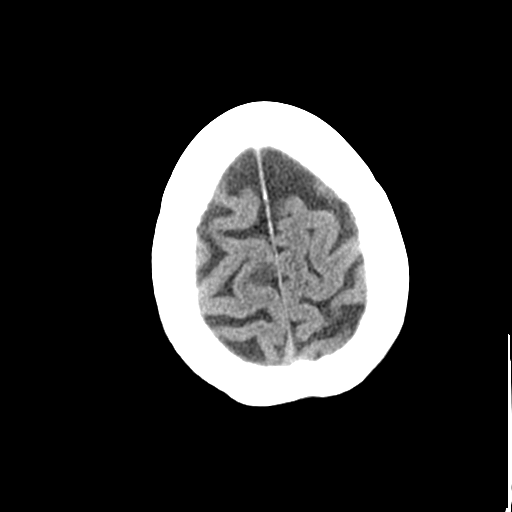
[im 31/33  brain]
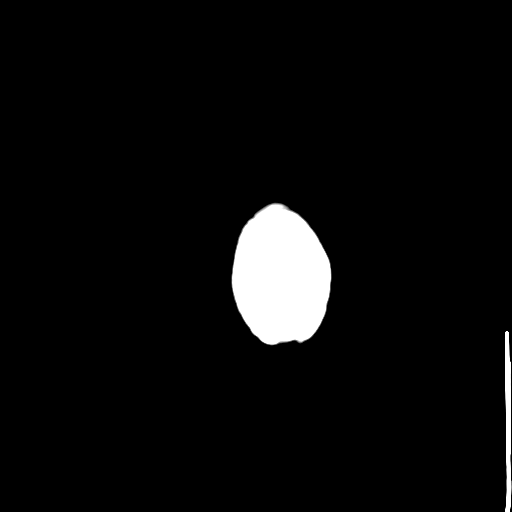

[Series 4: coronal soft tissue · coronal · 0.40mm/px · 3 of 84 slices shown]
[im 21/84  brain]
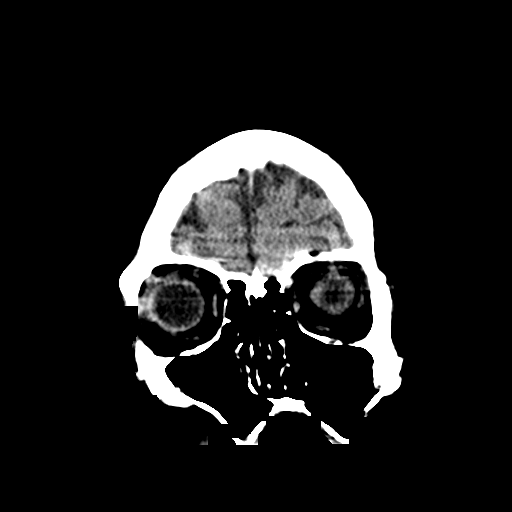
[im 42/84  brain]
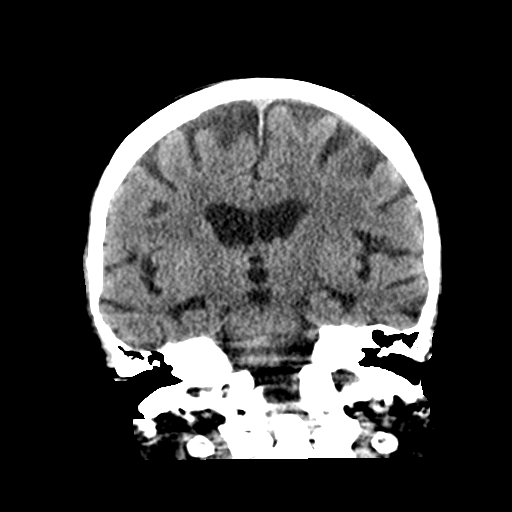
[im 63/84  brain]
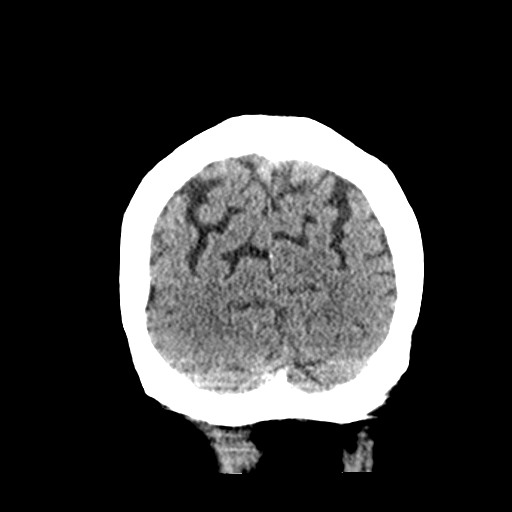

[Series 5: sagittal soft tissue · sagittal · 0.37mm/px · 2 of 82 slices shown]
[im 28/82  brain]
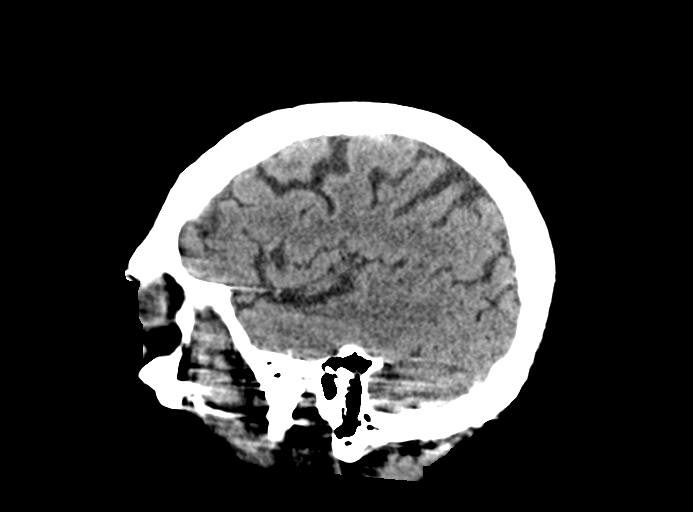
[im 55/82  brain]
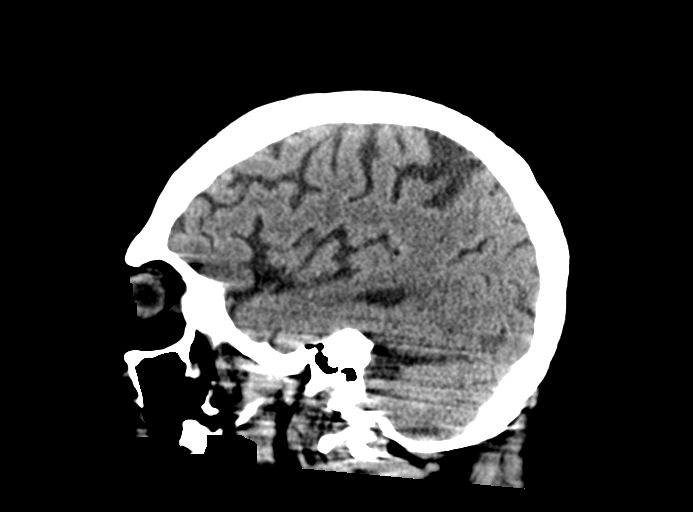

[16 of 47 positions shown; findings below may reference images not displayed]

FINDINGS: Brain: Age related volume loss. No acute intracranial abnormality.
Specifically, no hemorrhage, hydrocephalus, mass lesion, acute
infarction, or significant intracranial injury.

Vascular: No hyperdense vessel or unexpected calcification.

Skull: No acute calvarial abnormality.

Sinuses/Orbits: No acute findings

Other: None
IMPRESSION: No acute intracranial abnormality.

## 2020-11-17 IMAGING — DX DG CHEST 1V PORT
1 series · 1 of 1 positions shown · non-contrast
Comparison: [DATE]

CLINICAL DATA: Altered mental status

EXAM:
PORTABLE CHEST 1 VIEW

[chest ap]
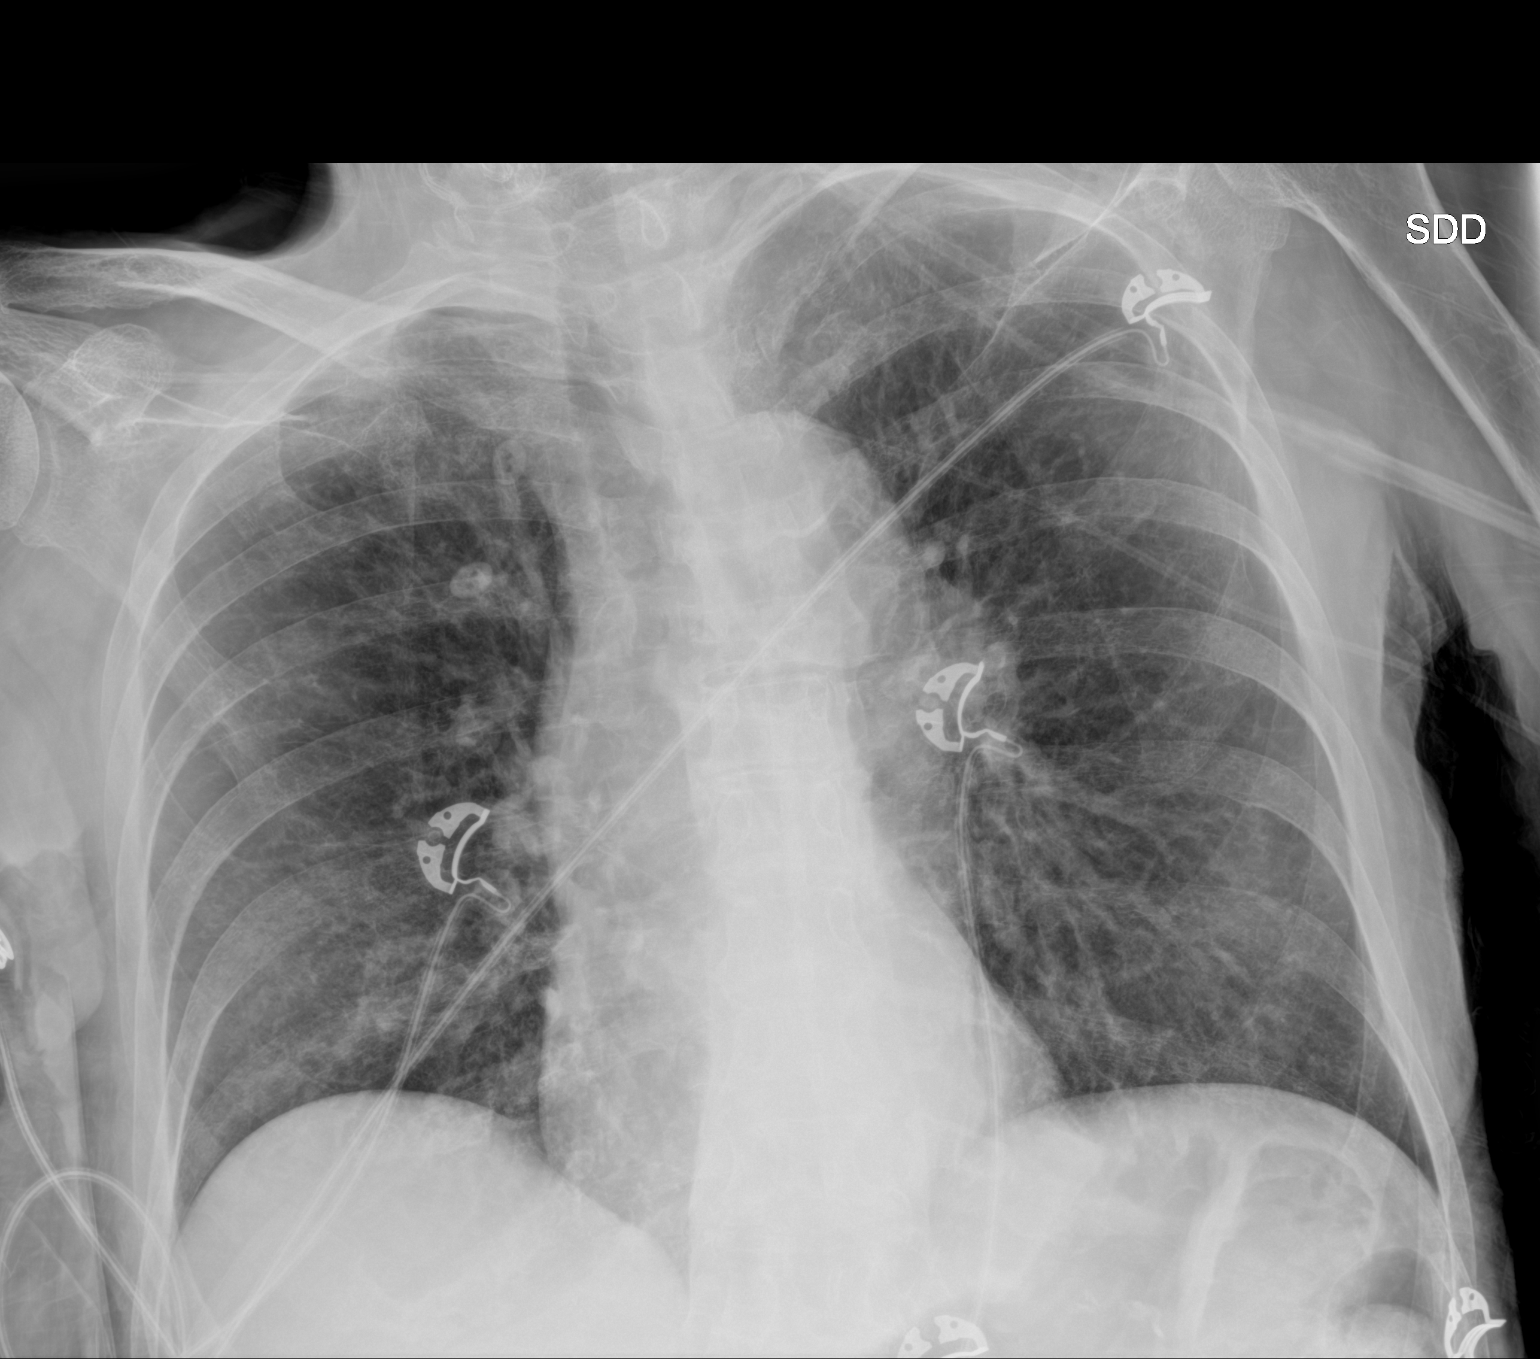

[1 of 1 positions shown; findings below may reference images not displayed]

FINDINGS: Heart and mediastinal contours are within normal limits. No focal
opacities or effusions. No acute bony abnormality. Aortic
atherosclerosis.
IMPRESSION: No active disease.

## 2020-11-17 MED ORDER — BENZTROPINE MESYLATE 0.5 MG PO TABS
0.5000 mg | ORAL_TABLET | Freq: Two times a day (BID) | ORAL | Status: DC
  Administered 2020-11-17 – 2020-11-18 (×4): 0.5 mg via ORAL
  Filled 2020-11-17 (×6): qty 1

## 2020-11-17 MED ORDER — SODIUM CHLORIDE 0.9 % IV SOLN: 2.0000 g | Freq: Once | INTRAVENOUS | Status: DC

## 2020-11-17 MED ORDER — FLUPHENAZINE DECANOATE 25 MG/ML IJ SOLN
25.0000 mg | INTRAMUSCULAR | Status: DC
Start: 2020-11-19 — End: 2020-11-19
  Filled 2020-11-17 (×2): qty 1

## 2020-11-17 MED ORDER — ACETAMINOPHEN 650 MG RE SUPP: 650.0000 mg | Freq: Four times a day (QID) | RECTAL | Status: DC | PRN

## 2020-11-17 MED ORDER — TRAZODONE HCL 50 MG PO TABS
50.0000 mg | ORAL_TABLET | Freq: Every day | ORAL | Status: DC
  Administered 2020-11-17 – 2020-11-18 (×2): 50 mg via ORAL
  Filled 2020-11-17 (×2): qty 1

## 2020-11-17 MED ORDER — MORPHINE SULFATE (PF) 2 MG/ML IV SOLN
1.0000 mg | INTRAVENOUS | Status: DC | PRN
  Administered 2020-11-17 – 2020-11-18 (×3): 1 mg via INTRAVENOUS
  Filled 2020-11-17 (×3): qty 1

## 2020-11-17 MED ORDER — VANCOMYCIN HCL IN DEXTROSE 1-5 GM/200ML-% IV SOLN
1000.0000 mg | Freq: Once | INTRAVENOUS | Status: DC
  Filled 2020-11-17: qty 200

## 2020-11-17 MED ORDER — HYDROCERIN EX CREA
1.0000 "application " | TOPICAL_CREAM | CUTANEOUS | Status: DC | PRN
  Filled 2020-11-17: qty 113

## 2020-11-17 MED ORDER — DEXAMETHASONE 4 MG PO TABS
4.0000 mg | ORAL_TABLET | Freq: Two times a day (BID) | ORAL | Status: DC
  Administered 2020-11-17 – 2020-11-18 (×3): 4 mg via ORAL
  Filled 2020-11-17 (×3): qty 1

## 2020-11-17 MED ORDER — ACETAMINOPHEN 325 MG PO TABS: 650.0000 mg | ORAL_TABLET | Freq: Four times a day (QID) | ORAL | Status: DC | PRN

## 2020-11-17 MED ORDER — SODIUM CHLORIDE 0.9 % IV SOLN: INTRAVENOUS | Status: DC

## 2020-11-17 MED ORDER — ONDANSETRON HCL 4 MG/2ML IJ SOLN: 4.0000 mg | Freq: Four times a day (QID) | INTRAMUSCULAR | Status: DC | PRN

## 2020-11-17 MED ORDER — SODIUM CHLORIDE 0.9 % IV SOLN
2.0000 g | Freq: Two times a day (BID) | INTRAVENOUS | Status: DC
  Administered 2020-11-17 – 2020-11-18 (×3): 2 g via INTRAVENOUS
  Filled 2020-11-17 (×3): qty 2

## 2020-11-17 MED ORDER — HALOPERIDOL LACTATE 5 MG/ML IJ SOLN
1.0000 mg | Freq: Four times a day (QID) | INTRAMUSCULAR | Status: DC | PRN
  Administered 2020-11-17 – 2020-11-18 (×3): 1 mg via INTRAVENOUS
  Filled 2020-11-17 (×3): qty 1

## 2020-11-17 MED ORDER — METRONIDAZOLE IN NACL 5-0.79 MG/ML-% IV SOLN
500.0000 mg | Freq: Once | INTRAVENOUS | Status: DC
  Administered 2020-11-17: 500 mg via INTRAVENOUS
  Filled 2020-11-17: qty 100

## 2020-11-17 MED ORDER — LOPERAMIDE HCL 2 MG PO CAPS: 4.0000 mg | ORAL_CAPSULE | ORAL | Status: DC | PRN

## 2020-11-17 MED ORDER — LACTATED RINGERS IV SOLN: INTRAVENOUS | Status: DC

## 2020-11-17 MED ORDER — GUAIFENESIN 100 MG/5ML PO SOLN
300.0000 mg | Freq: Four times a day (QID) | ORAL | Status: DC | PRN
  Filled 2020-11-17: qty 15

## 2020-11-17 MED ORDER — VITAMIN D 25 MCG (1000 UNIT) PO TABS
2000.0000 [IU] | ORAL_TABLET | Freq: Every morning | ORAL | Status: DC
  Administered 2020-11-17 – 2020-11-18 (×2): 2000 [IU] via ORAL
  Filled 2020-11-17 (×2): qty 2

## 2020-11-17 MED ORDER — METRONIDAZOLE IN NACL 5-0.79 MG/ML-% IV SOLN
500.0000 mg | Freq: Three times a day (TID) | INTRAVENOUS | Status: DC
Start: 2020-11-17 — End: 2020-11-18
  Administered 2020-11-17 – 2020-11-18 (×4): 500 mg via INTRAVENOUS
  Filled 2020-11-17 (×5): qty 100

## 2020-11-17 MED ORDER — RISAQUAD PO CAPS
1.0000 | ORAL_CAPSULE | Freq: Every day | ORAL | Status: DC
  Administered 2020-11-17 – 2020-11-18 (×2): 1 via ORAL
  Filled 2020-11-17 (×2): qty 1

## 2020-11-17 MED ORDER — SACCHAROMYCES BOULARDII 250 MG PO CAPS
250.0000 mg | ORAL_CAPSULE | Freq: Every day | ORAL | Status: DC
  Administered 2020-11-17 – 2020-11-18 (×2): 250 mg via ORAL
  Filled 2020-11-17 (×3): qty 1

## 2020-11-17 MED ORDER — LITHIUM CARBONATE 150 MG PO CAPS
150.0000 mg | ORAL_CAPSULE | Freq: Two times a day (BID) | ORAL | Status: DC
  Administered 2020-11-17 – 2020-11-18 (×4): 150 mg via ORAL
  Filled 2020-11-17 (×6): qty 1

## 2020-11-17 MED ORDER — ENOXAPARIN SODIUM 40 MG/0.4ML ~~LOC~~ SOLN
40.0000 mg | SUBCUTANEOUS | Status: DC
  Administered 2020-11-17 – 2020-11-18 (×2): 40 mg via SUBCUTANEOUS
  Filled 2020-11-17 (×2): qty 0.4

## 2020-11-17 MED ORDER — SODIUM CHLORIDE 0.9 % IV BOLUS
1000.0000 mL | Freq: Once | INTRAVENOUS | Status: AC
  Administered 2020-11-17: 1000 mL via INTRAVENOUS

## 2020-11-17 MED ORDER — LORATADINE 10 MG PO TABS
10.0000 mg | ORAL_TABLET | Freq: Every day | ORAL | Status: DC
  Administered 2020-11-17 – 2020-11-18 (×2): 10 mg via ORAL
  Filled 2020-11-17 (×2): qty 1

## 2020-11-17 MED ORDER — ACETAMINOPHEN 325 MG PO TABS: 650.0000 mg | ORAL_TABLET | ORAL | Status: DC | PRN

## 2020-11-17 MED ORDER — CRANBERRY 250 MG PO CAPS: 500.0000 mg | ORAL_CAPSULE | Freq: Every day | ORAL | Status: DC

## 2020-11-17 MED ORDER — VANCOMYCIN HCL IN DEXTROSE 1-5 GM/200ML-% IV SOLN
1000.0000 mg | Freq: Once | INTRAVENOUS | Status: AC
  Administered 2020-11-17: 1000 mg via INTRAVENOUS

## 2020-11-17 MED ORDER — OLANZAPINE 10 MG PO TBDP
10.0000 mg | ORAL_TABLET | Freq: Every day | ORAL | Status: DC
  Administered 2020-11-17 – 2020-11-18 (×2): 10 mg via ORAL
  Filled 2020-11-17 (×3): qty 1

## 2020-11-17 MED ORDER — DEXAMETHASONE 4 MG PO TABS: 4.0000 mg | ORAL_TABLET | Freq: Two times a day (BID) | ORAL | Status: DC

## 2020-11-17 MED ORDER — LACTINEX PO CHEW
1.0000 | CHEWABLE_TABLET | Freq: Every day | ORAL | Status: DC
  Filled 2020-11-17 (×2): qty 1

## 2020-11-17 MED ORDER — CLONAZEPAM 0.5 MG PO TABS
0.5000 mg | ORAL_TABLET | Freq: Two times a day (BID) | ORAL | Status: DC | PRN
  Administered 2020-11-17 – 2020-11-19 (×3): 0.5 mg via ORAL
  Filled 2020-11-17 (×3): qty 1

## 2020-11-17 MED ORDER — SODIUM CHLORIDE 0.9 % IV BOLUS (SEPSIS): 1000.0000 mL | Freq: Once | INTRAVENOUS | Status: DC

## 2020-11-17 MED ORDER — VANCOMYCIN HCL 500 MG/100ML IV SOLN
500.0000 mg | INTRAVENOUS | Status: DC
  Administered 2020-11-18: 500 mg via INTRAVENOUS
  Filled 2020-11-17: qty 100

## 2020-11-17 MED ORDER — TRAZODONE HCL 50 MG PO TABS: 25.0000 mg | ORAL_TABLET | Freq: Every evening | ORAL | Status: DC | PRN

## 2020-11-17 MED ORDER — MAGNESIUM HYDROXIDE 400 MG/5ML PO SUSP
30.0000 mL | Freq: Every day | ORAL | Status: DC | PRN
  Filled 2020-11-17: qty 30

## 2020-11-17 MED ORDER — ONDANSETRON HCL 4 MG PO TABS: 4.0000 mg | ORAL_TABLET | Freq: Four times a day (QID) | ORAL | Status: DC | PRN

## 2020-11-17 MED ORDER — BUPROPION HCL ER (XL) 150 MG PO TB24
150.0000 mg | ORAL_TABLET | ORAL | Status: DC
  Administered 2020-11-17 – 2020-11-18 (×2): 150 mg via ORAL
  Filled 2020-11-17 (×2): qty 1

## 2020-11-17 MED ORDER — SODIUM CHLORIDE 0.9 % IV SOLN
2.0000 g | Freq: Once | INTRAVENOUS | Status: DC
  Filled 2020-11-17: qty 2

## 2020-11-17 MED ORDER — CAMPHOR-MENTHOL 0.5-0.5 % EX LOTN
TOPICAL_LOTION | Freq: Two times a day (BID) | CUTANEOUS | Status: DC | PRN
  Filled 2020-11-17: qty 222

## 2020-11-17 MED ORDER — ATORVASTATIN CALCIUM 20 MG PO TABS
20.0000 mg | ORAL_TABLET | Freq: Every evening | ORAL | Status: DC
  Administered 2020-11-17: 20 mg via ORAL
  Filled 2020-11-17: qty 1

## 2020-11-17 MED ORDER — MORPHINE SULFATE (PF) 2 MG/ML IV SOLN
1.0000 mg | INTRAVENOUS | Status: DC | PRN
  Administered 2020-11-17 (×2): 1 mg via INTRAVENOUS
  Filled 2020-11-17 (×2): qty 1

## 2020-11-17 MED ORDER — MAGNESIUM HYDROXIDE 400 MG/5ML PO SUSP: 30.0000 mL | Freq: Every day | ORAL | Status: DC | PRN

## 2020-11-17 NOTE — ED Provider Notes (Signed)
[facility] Emergency Department Provider Note   ____________________________________________   Event Date/Time   First MD Initiated Contact with Patient 11/17/20 0023     (approximate)  I have reviewed the triage vital signs and the nursing notes.   HISTORY  Chief Complaint Altered mental status  Level of V caveat: Limited by decreased responsiveness   HPI Danielle Steele is a 80 y.o. female brought to the ED via EMS from SNF with a chief complaint of altered mentation.  Staff reports patient is normally interactive and talkative.  She was found to be unresponsive; unknown how long patient had been like that.  Patient arrives to the ED moaning, reaching up to the sky with both hands, nonverbal and unable to follow commands.  Rest of history is unobtainable secondary to patient's decreased responsiveness.  Per records, patient was recently hospitalized 11/08/2020-11/15/2020 for abdominal pain, hypotension and altered mental status.  Found to have urinary retention, leukocytosis and AKI.  Had brain MRI revealing metastatic disease and started on Decadron 4 mg twice daily with improvement of her mental status.     Past Medical History:  Diagnosis Date  . Anxiety   . Bipolar disorder ([place])   . Depression   . GERD (gastroesophageal reflux disease)   . Hypertension   . PND (post-nasal drip)    CAUSES SOME COUGH  . Pre-diabetes   . Schizophrenia ([place])    SCHIZO  AFFECTIVE DISORDER  . Thyroid nodule   . Tremors of nervous system     Patient Active Problem List   Diagnosis Date Noted  . Altered mental status 11/17/2020  . Primary malignant neoplasm of lung metastatic to other site [facility])   . Brain metastases ([place])   . Goals of care, counseling/discussion   . Palliative care encounter   . Bone metastases ([place])   . Lymphadenopathy   . Abdominal pain 11/08/2020  . UTI (urinary tract infection) 11/08/2020  . Sepsis ([place]) 11/08/2020  . Acute metabolic  encephalopathy [id number]/9381  . Schizophrenia ([place])   . Bipolar disorder ([place])   . Depression   . Anxiety   . Hypertension   . HLD (hyperlipidemia)   . AKI (acute kidney injury) ([place])   . Abnormal CT scan     Past Surgical History:  Procedure Laterality Date  . CATARACT EXTRACTION W/PHACO Left 09/07/2017   Procedure: CATARACT EXTRACTION PHACO AND INTRAOCULAR LENS PLACEMENT (IOC);  Surgeon: Birder Robson, MD;  Location: [facility] ORS;  Service: Ophthalmology;  Laterality: Left;  [place] 00:39.9 AP% 17.4 CDE 6.94 Fluid Pack lot # [id number]  . CATARACT EXTRACTION W/PHACO Right 09/28/2017   Procedure: CATARACT EXTRACTION PHACO AND INTRAOCULAR LENS PLACEMENT (IOC);  Surgeon: Birder Robson, MD;  Location: [facility] ORS;  Service: Ophthalmology;  Laterality: Right;  [place] 01:31.8 AP% 13.7 CDE 12.55 Fluid Pack Lot # [id number]   . EYE SURGERY    . TONSILLECTOMY      Prior to Admission medications   Medication Sig Start Date End Date Taking? Authorizing Provider  acetaminophen (TYLENOL) 325 MG tablet Take 650 mg by mouth every 4 (four) hours as needed for mild pain or moderate pain.    [provider]  atorvastatin (LIPITOR) 20 MG tablet Take 20 mg by mouth every evening.    [provider]  barrier cream (NON-SPECIFIED) CREA Apply 1 application topically as needed (apply topically after toileting as needed to prevent skin breakdown).    [provider]  benztropine (COGENTIN) 0.5 MG tablet Take 0.5  mg by mouth 2 (two) times daily.    [provider]  buPROPion (WELLBUTRIN XL) 150 MG 24 hr tablet Take 150 mg by mouth every morning.    [provider]  Camphor-Menthol (MEN-PHOR EX) Apply 1 application topically 2 (two) times daily as needed.    [provider]  cetirizine (ZYRTEC) 10 MG tablet Take 10 mg by mouth daily. 10/28/20   [provider]  Cholecalciferol ([employer] VITAMIN D3) 25 MCG (1000 UT) CHEW Chew 2,000 Units by mouth in the morning.     [provider]  clonazePAM (KLONOPIN) 0.5 MG tablet Take 1 tablet (0.5 mg total) by mouth 2 (two) times daily as needed for anxiety. 11/15/20   Nolberto Hanlon, MD  Cranberry 250 MG CAPS Take 500 mg by mouth daily.     [provider]  dexamethasone (DECADRON) 4 MG tablet Take 1 tablet (4 mg total) by mouth every 12 (twelve) hours. 11/15/20   Nolberto Hanlon, MD  diclofenac Sodium (VOLTAREN) 1 % GEL Apply topically. 10/04/20   [provider]  Difluprednate (DUREZOL) 0.05 % EMUL Apply 1 drop to eye 2 (two) times daily.    [provider]  fluPHENAZine decanoate (PROLIXIN) 25 MG/ML injection Inject 25 mg into the muscle every 30 (thirty) days.    [provider]  guaiFENesin (ROBITUSSIN) 100 MG/5ML liquid Take 300 mg by mouth every 6 (six) hours as needed for cough.     [provider]  ketoconazole (NIZORAL) 2 % cream Apply 1 application topically 2 (two) times daily.    [provider]  lactobacilus acidophilus & bulgar (FLORANEX) TABS chewable tablet Take 1 tablet by mouth daily. 10/28/20   [provider]  lithium carbonate 150 MG capsule Take 150 mg by mouth 2 (two) times daily with a meal.    [provider]  loperamide (IMODIUM A-D) 2 MG tablet Take 4 mg by mouth as needed for diarrhea or loose stools (No more than 8 doses in 24 hours).     [provider]  magnesium hydroxide (MILK OF MAGNESIA) 400 MG/5ML suspension Take 30 mLs by mouth daily as needed for mild constipation.    [provider]  saccharomyces boulardii (FLORASTOR) 250 MG capsule Take 250 mg by mouth daily. Patient not taking: Reported on 11/08/2020    [provider]  traZODone (DESYREL) 50 MG tablet Take 50 mg by mouth at bedtime.    [provider]    Allergies Citrus  No family history on file.  Social History Social History   Tobacco Use  . Smoking status: Former Research scientist (life sciences)  . Smokeless tobacco: Never Used   Vaping Use  . Vaping Use: Never used  Substance Use Topics  . Alcohol use: No  . Drug use: No    Review of Systems  Constitutional: No fever/chills Eyes: No visual changes. ENT: No sore throat. Cardiovascular: Denies chest pain. Respiratory: Denies shortness of breath. Gastrointestinal: No abdominal pain.  No nausea, no vomiting.  No diarrhea.  No constipation. Genitourinary: Negative for dysuria. Musculoskeletal: Negative for back pain. Skin: Negative for rash. Neurological: Positive for decreased responsiveness.  Negative for headaches, focal weakness or numbness.   ____________________________________________   PHYSICAL EXAM:  VITAL SIGNS: ED Triage Vitals  Enc Vitals Group     BP      Pulse      Resp      Temp      Temp src      SpO2  Weight      Height      Head Circumference      Peak Flow      Pain Score      Pain Loc      Pain Edu?      Excl. in [place]?     Constitutional: Alert.  Elderly appearing and in no acute distress. Eyes: Conjunctivae are normal. PERRL. EOMI. Head: Atraumatic. Nose: Atraumatic. Mouth/Throat: Mucous membranes are mildly dry.  No dental malocclusion.  Neck: No stridor.  No cervical spine tenderness to palpation. Cardiovascular: Normal rate, regular rhythm. Grossly normal heart sounds.  Good peripheral circulation. Respiratory: Normal respiratory effort.  No retractions. Lungs CTAB. Gastrointestinal: Soft and nontender to light or deep palpation. No distention. No abdominal bruits. No CVA tenderness. Musculoskeletal: No lower extremity tenderness nor edema.  No joint effusions. Neurologic: Moaning, eyes open spontaneously.  Unable to follow simple commands. No gross focal neurologic deficits are appreciated. MAEx4. Skin:  Skin is warm, dry and intact. No rash noted.  No petechiae. Psychiatric: Unable to assess.  ____________________________________________   LABS (all labs ordered are listed, but only abnormal results are  displayed)  Labs Reviewed  CBC WITH DIFFERENTIAL/PLATELET - Abnormal; Notable for the following components:      Result Value   WBC 35.0 (*)    Neutro Abs 29.9 (*)    Monocytes Absolute 2.2 (*)    Eosinophils Absolute 0.6 (*)    Abs Immature Granulocytes 1.17 (*)    All other components within normal limits  COMPREHENSIVE METABOLIC PANEL - Abnormal; Notable for the following components:   Glucose, Bld 132 (*)    BUN 33 (*)    Creatinine, Ser 1.15 (*)    Calcium 10.4 (*)    Albumin 3.4 (*)    GFR, Estimated 49 (*)    All other components within normal limits  BRAIN NATRIURETIC PEPTIDE - Abnormal; Notable for the following components:   B Natriuretic Peptide 119.6 (*)    All other components within normal limits  URINALYSIS, COMPLETE (UACMP) WITH MICROSCOPIC - Abnormal; Notable for the following components:   Color, Urine YELLOW (*)    APPearance CLEAR (*)    Hgb urine dipstick LARGE (*)    RBC / HPF >50 (*)    Bacteria, UA RARE (*)    All other components within normal limits  LITHIUM LEVEL - Abnormal; Notable for the following components:   Lithium Lvl 0.52 (*)    All other components within normal limits  CBC - Abnormal; Notable for the following components:   WBC 28.2 (*)    RBC 3.51 (*)    Hemoglobin 11.0 (*)    HCT 34.4 (*)    All other components within normal limits  TROPONIN I (HIGH SENSITIVITY) - Abnormal; Notable for the following components:   Troponin I (High Sensitivity) 27 (*)    All other components within normal limits  TROPONIN I (HIGH SENSITIVITY) - Abnormal; Notable for the following components:   Troponin I (High Sensitivity) 28 (*)    All other components within normal limits  RESP PANEL BY RT-PCR (FLU A&B, COVID) ARPGX2  CULTURE, BLOOD (ROUTINE X 2)  CULTURE, BLOOD (ROUTINE X 2)  URINE CULTURE  LACTIC ACID, PLASMA  PROCALCITONIN  PROTIME-INR  CORTISOL-AM, BLOOD  PROCALCITONIN  BASIC METABOLIC PANEL    ____________________________________________  EKG  ED ECG REPORT I, Daleah Coulson J, the attending physician, personally viewed and interpreted this ECG.   Date: 11/17/2020  EKG Time: 0148  Rate: 81  Rhythm: normal EKG, normal sinus rhythm  Axis: Normal  Intervals:none  ST&T Change: Nonspecific  ____________________________________________  RADIOLOGY I, Tilla Wilborn J, personally viewed and evaluated these images (plain radiographs) as part of my medical decision making, as well as reviewing the written report by the radiologist.  ED MD interpretation: No ICH, no acute cardiopulmonary process  Official radiology report(s): CT Head Wo Contrast  Result Date: 11/17/2020 CLINICAL DATA:  Altered mental status EXAM: CT HEAD WITHOUT CONTRAST TECHNIQUE: Contiguous axial images were obtained from the base of the skull through the vertex without intravenous contrast. COMPARISON:  11/08/2020 FINDINGS: Brain: Age related volume loss. No acute intracranial abnormality. Specifically, no hemorrhage, hydrocephalus, mass lesion, acute infarction, or significant intracranial injury. Vascular: No hyperdense vessel or unexpected calcification. Skull: No acute calvarial abnormality. Sinuses/Orbits: No acute findings Other: None IMPRESSION: No acute intracranial abnormality. Electronically Signed   By: Rolm Baptise M.D.   On: 11/17/2020 00:57   DG Chest Port 1 View  Result Date: 11/17/2020 CLINICAL DATA:  Altered mental status EXAM: PORTABLE CHEST 1 VIEW COMPARISON:  11/08/2020 FINDINGS: Heart and mediastinal contours are within normal limits. No focal opacities or effusions. No acute bony abnormality. Aortic atherosclerosis. IMPRESSION: No active disease. Electronically Signed   By: Rolm Baptise M.D.   On: 11/17/2020 01:30    ____________________________________________   PROCEDURES  Procedure(s) performed (including Critical Care):  .1-3 Lead EKG Interpretation Performed by: Paulette Blanch,  MD Authorized by: Paulette Blanch, MD     Interpretation: normal     ECG rate:  75   ECG rate assessment: normal     Rhythm: sinus rhythm     Ectopy: none     Conduction: normal   Comments:     Patient placed on cardiac monitor to evaluate for arrthymias   CRITICAL CARE Performed by: Paulette Blanch   Total critical care time: 30 minutes  Critical care time was exclusive of separately billable procedures and treating other patients.  Critical care was necessary to treat or prevent imminent or life-threatening deterioration.  Critical care was time spent personally by me on the following activities: development of treatment plan with patient and/or surrogate as well as nursing, discussions with consultants, evaluation of patient's response to treatment, examination of patient, obtaining history from patient or surrogate, ordering and performing treatments and interventions, ordering and review of laboratory studies, ordering and review of radiographic studies, pulse oximetry and re-evaluation of patient's condition.   ____________________________________________   INITIAL IMPRESSION / ASSESSMENT AND PLAN / ED COURSE  As part of my medical decision making, I reviewed the following data within the [place] History obtained from family, Nursing notes reviewed and incorporated, Labs reviewed, EKG interpreted, Old chart reviewed, Radiograph reviewed, Discussed with admitting physician and Notes from prior ED visits     80 year old female presenting with AMS. Differential diagnosis includes, but is not limited to, alcohol, illicit or prescription medications, or other toxic ingestion; intracranial pathology such as stroke or intracerebral hemorrhage; fever or infectious causes including sepsis; hypoxemia and/or hypercarbia; uremia; trauma; endocrine related disorders such as diabetes, hypoglycemia, and thyroid-related diseases; hypertensive encephalopathy; etc.  Sounds  similar to her presentation on most recent hospitalization. Will obtain labwork, UA, CT head, CXR. Anticipate hospitalization.   Clinical Course as of 11/17/20 0508  Sun Nov 17, 2020  0135 Noted WBC, lactic acid, mildly elevated troponin, AKI, subtherapeutic lithium level.  Awaiting results of UA.  Chest x-ray and CT  head unremarkable.  I personally reviewed patient's prior records and see that she had urinary retention while in the hospital.  After In-N-Out cath, bladder scan reveals greater than 400 cc.  Will insert Foley catheter [JS]  0242 Updated patient's son test results.  Discussed with hospitalist services for admission. [JS]    Clinical Course User Index [JS] Paulette Blanch, MD     ____________________________________________   FINAL CLINICAL IMPRESSION(S) / ED DIAGNOSES  Final diagnoses:  Altered mental status, unspecified altered mental status type  AKI (acute kidney injury) ([place])  Leukocytosis, unspecified type     ED Discharge Orders    None      *Please note:  Danielle Steele was evaluated in Emergency Department on 11/17/2020 for the symptoms described in the history of present illness. She was evaluated in the context of the global COVID-19 pandemic, which necessitated consideration that the patient might be at risk for infection with the SARS-CoV-2 virus that causes COVID-19. Institutional protocols and algorithms that pertain to the evaluation of patients at risk for COVID-19 are in a state of rapid change based on information released by regulatory bodies including the CDC and federal and state organizations. These policies and algorithms were followed during the patient's care in the ED.  Some ED evaluations and interventions may be delayed as a result of limited staffing during and the pandemic.*   Note:  This document was prepared using Dragon voice recognition software and may include unintentional dictation errors.   Paulette Blanch, MD 11/17/20 [phone]

## 2020-11-17 NOTE — ED Notes (Signed)
Bladder scan showed 406 mL.

## 2020-11-17 NOTE — Progress Notes (Signed)
Patient ID: Arfa Lamarca, female   DOB: 1941/06/12, 80 y.o.   MRN: [id number] This is a no charge note as patient was admitted this AM.  Patient seen and examined chart reviewed.  Patient is well-known to me.  Surah Pelley is a 80 y.o. Caucasian female with medical history significant for anxiety, bipolar disorder, hypertension, GERD and schizoaffective disorder, who presented to the emergency room with acute onset of altered mental status.  She was just discharged and she was at baseline.  Per daughter-in-law told me personally today patient has not been sleeping last couple of days since she has been at the skilled  facility and started becoming confused.  They are also interested in talking to hospice about hospice house if she meets criteria for that.  Vss, pt pleasant, calm, smiling cta no w/r/r Regular s1/s2 no gallop Soft benign +bs No edema   A/p Will monitor Family interested in hospice house, will ask hospice to see pt in am No s/sx of infection, will f/u on cx. Continue iv abx Reorientation,.

## 2020-11-17 NOTE — Consult Note (Signed)
[facility] Consult   Reason for Consult: Consult for 80 year old woman with a past history of schizophrenia and/or bipolar disorder but who is presenting now with delirium Referring Physician: Burnett Harry Patient Identification: Danielle Steele MRN:  [id number] Principal Diagnosis: Acute delirium Diagnosis:  Principal Problem:   Acute delirium Active Problems:   Schizophrenia ([place])   Bipolar disorder ([place])   Altered mental status   Total Time spent with patient: 1 hour  Subjective:   Danielle Steele is a 80 y.o. female patient admitted with patient unable to give any information.  HPI: 80 year old woman presents with delirium.  Patient is currently unable to communicate much information to me.  Primary source of information was the chart and the patient's daughter-in-law who was present in the room.  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  They were called to come see her and found her picking at herself chewing on herself pulling at things unresponsive very confused.  Patient evidently had not slept well for at least a day.  On interview today the patient talks to the daughter-in-law but not to me.  Her speech is very dysarthric and difficult to understand.  Daughter-in-law can understand her and the patient is talking about things that are not related to the acute situation but is not hostile or angry and appears to be fairly calm when she is awake right now.  Daughter reports that the patient shows signs of obviously having pain in her back and discomfort at times.  This woman has a history of chronic mental illness and cognitive decline.  Daughter-in-law reports that at her  baseline prior to this acute episode she was usually confused psychotic frequently talking about things that were not real but did recognize family and was not hostile.  She has been on psychiatric medicine for years.  Low-dose lithium, low dose of Wellbutrin and a Prolixin decanoate shot.  These have been administered continually without interruption.  Past Psychiatric History: Patient has a history of chronic mental illness.  Seems to have been stable for years.  Cannot find in the chart any record of hospitalizations.  Daughter-in-law reports that while the patient's baseline is symptomatic that she has not required hospitalization in many years and had been stable on her medicines which most recently were prescribed not by a psychiatrist but by her primary provider.  She has had an episode of lithium toxicity in the not too distant past and is now at a low level.  Lithium level done today fortunately was only 0.5.  Risk to Self:   Risk to Others:   Prior Inpatient Therapy:   Prior Outpatient Therapy:    Past Medical History:  Past Medical History:  Diagnosis Date  . Anxiety   . Bipolar disorder ([place])   . Depression   . GERD (gastroesophageal reflux disease)   . Hypertension   . PND (post-nasal drip)    CAUSES SOME COUGH  . Pre-diabetes   . Schizophrenia ([place])    SCHIZO  AFFECTIVE DISORDER  . Thyroid nodule   . Tremors of nervous system     Past Surgical History:  Procedure Laterality Date  . CATARACT EXTRACTION W/PHACO Left 09/07/2017   Procedure: CATARACT EXTRACTION PHACO  AND INTRAOCULAR LENS PLACEMENT (IOC);  Surgeon: Birder Robson, MD;  Location: [facility] ORS;  Service: Ophthalmology;  Laterality: Left;  [place] 00:39.9 AP% 17.4 CDE 6.94 Fluid Pack lot # [id number]  . CATARACT EXTRACTION W/PHACO Right 09/28/2017   Procedure: CATARACT EXTRACTION PHACO AND INTRAOCULAR LENS PLACEMENT (IOC);  Surgeon: Birder Robson, MD;  Location: [facility] ORS;  Service: Ophthalmology;  Laterality: Right;   [place] 01:31.8 AP% 13.7 CDE 12.55 Fluid Pack Lot # [id number]   . EYE SURGERY    . TONSILLECTOMY     Family History: No family history on file. Family Psychiatric  History: None reported Social History:  Social History   Substance and Sexual Activity  Alcohol Use No     Social History   Substance and Sexual Activity  Drug Use No    Social History   Socioeconomic History  . Marital status: Married    Spouse name: Not on file  . Number of children: Not on file  . Years of education: Not on file  . Highest education level: Not on file  Occupational History  . Not on file  Tobacco Use  . Smoking status: Former Research scientist (life sciences)  . Smokeless tobacco: Never Used  Vaping Use  . Vaping Use: Never used  Substance and Sexual Activity  . Alcohol use: No  . Drug use: No  . Sexual activity: Not on file  Other Topics Concern  . Not on file  Social History Narrative  . Not on file   Social Determinants of Health   Financial Resource Strain: Not on file  Food Insecurity: Not on file  Transportation Needs: Not on file  Physical Activity: Not on file  Stress: Not on file  Social Connections: Not on file   Additional Social History:    Allergies:   Allergies  Allergen Reactions  . Citrus Rash    Labs:  Results for orders placed or performed during the hospital encounter of 11/17/20 (from the past 48 hour(s))  Culture, blood (routine x 2)     Status: None (Preliminary result)   Collection Time: 11/17/20 12:31 AM   Specimen: BLOOD LEFT FOREARM  Result Value Ref Range   Specimen Description BLOOD LEFT FOREARM    Special Requests      BOTTLES DRAWN AEROBIC AND ANAEROBIC Blood Culture adequate volume   Culture      NO GROWTH < 12 HOURS Performed at [facility], [address] [place], [place] [address]    Report Status PENDING   Urinalysis, Complete w Microscopic     Status: Abnormal   Collection Time: 11/17/20 12:31 AM  Result Value Ref Range   Color, Urine YELLOW  (A) YELLOW   APPearance CLEAR (A) CLEAR   Specific Gravity, Urine 1.010 1.005 - 1.030   pH 6.0 5.0 - 8.0   Glucose, UA NEGATIVE NEGATIVE mg/dL   Hgb urine dipstick LARGE (A) NEGATIVE   Bilirubin Urine NEGATIVE NEGATIVE   Ketones, ur NEGATIVE NEGATIVE mg/dL   Protein, ur NEGATIVE NEGATIVE mg/dL   Nitrite NEGATIVE NEGATIVE   Leukocytes,Ua NEGATIVE NEGATIVE   RBC / HPF >50 (H) 0 - 5 RBC/hpf   WBC, UA 0-5 0 - 5 WBC/hpf   Bacteria, UA RARE (A) NONE SEEN   Squamous Epithelial / LPF 0-5 0 - 5   Mucus PRESENT    Hyaline Casts, UA PRESENT     Comment: Performed at [facility], [address] [place], [place] [address]  Resp Panel by RT-PCR (Flu A&B, Covid)  Status: None   Collection Time: 11/17/20 12:31 AM   Specimen: Nasopharyngeal(NP) swabs in vial transport medium  Result Value Ref Range   SARS Coronavirus 2 by RT PCR NEGATIVE NEGATIVE    Comment: (NOTE) SARS-CoV-2 target nucleic acids are NOT DETECTED.  The SARS-CoV-2 RNA is generally detectable in upper respiratory specimens during the acute phase of infection. The lowest concentration of SARS-CoV-2 viral copies this assay can detect is 138 copies/mL. A negative result does not preclude SARS-Cov-2 infection and should not be used as the sole basis for treatment or other patient management decisions. A negative result may occur with  improper specimen collection/handling, submission of specimen other than nasopharyngeal swab, presence of viral mutation(s) within the areas targeted by this assay, and inadequate number of viral copies(<138 copies/mL). A negative result must be combined with clinical observations, patient history, and epidemiological information. The expected result is Negative.  Fact Sheet for Patients:  EntrepreneurPulse.com.au  Fact Sheet for Healthcare Providers:  IncredibleEmployment.be  This test is no t yet approved or cleared by the [place] FDA and   has been authorized for detection and/or diagnosis of SARS-CoV-2 by FDA under an Emergency Use Authorization (EUA). This EUA will remain  in effect (meaning this test can be used) for the duration of the COVID-19 declaration under Section 564(b)(1) of the Act, 21 U.S.C.section 360bbb-3(b)(1), unless the authorization is terminated  or revoked sooner.       Influenza A by PCR NEGATIVE NEGATIVE   Influenza B by PCR NEGATIVE NEGATIVE    Comment: (NOTE) The Xpert Xpress SARS-CoV-2/FLU/RSV plus assay is intended as an aid in the diagnosis of influenza from Nasopharyngeal swab specimens and should not be used as a sole basis for treatment. Nasal washings and aspirates are unacceptable for Xpert Xpress SARS-CoV-2/FLU/RSV testing.  Fact Sheet for Patients: EntrepreneurPulse.com.au  Fact Sheet for Healthcare Providers: IncredibleEmployment.be  This test is not yet approved or cleared by the [place] FDA and has been authorized for detection and/or diagnosis of SARS-CoV-2 by FDA under an Emergency Use Authorization (EUA). This EUA will remain in effect (meaning this test can be used) for the duration of the COVID-19 declaration under Section 564(b)(1) of the Act, 21 U.S.C. section 360bbb-3(b)(1), unless the authorization is terminated or revoked.  Performed at [facility], [address] [place], [place] [address]   Culture, blood (routine x 2)     Status: None (Preliminary result)   Collection Time: 11/17/20 12:36 AM   Specimen: Right Antecubital; Blood  Result Value Ref Range   Specimen Description RIGHT ANTECUBITAL    Special Requests      BOTTLES DRAWN AEROBIC AND ANAEROBIC Blood Culture adequate volume   Culture      NO GROWTH < 12 HOURS Performed at [facility], [address], [place], [place] [address]    Report Status PENDING   Lactic acid, plasma     Status: None   Collection Time: 11/17/20 12:38 AM   Result Value Ref Range   Lactic Acid, Venous 1.6 0.5 - 1.9 mmol/L    Comment: Performed at [facility], [address] [place], [place] [address]  CBC with Differential     Status: Abnormal   Collection Time: 11/17/20 12:38 AM  Result Value Ref Range   WBC 35.0 (H) 4.0 - 10.5 K/uL   RBC 4.16 3.87 - 5.11 MIL/uL   Hemoglobin 13.0 12.0 - 15.0 g/dL   HCT 40.2 36.0 - 46.0 %   MCV  96.6 80.0 - 100.0 fL   MCH 31.3 26.0 - 34.0 pg   MCHC 32.3 30.0 - 36.0 g/dL   RDW 13.6 11.5 - 15.5 %   Platelets 251 150 - 400 K/uL   nRBC 0.1 0.0 - 0.2 %   Neutrophils Relative % 86 %   Neutro Abs 29.9 (H) 1.7 - 7.7 K/uL   Lymphocytes Relative 3 %   Lymphs Abs 1.0 0.7 - 4.0 K/uL   Monocytes Relative 6 %   Monocytes Absolute 2.2 (H) 0.1 - 1.0 K/uL   Eosinophils Relative 2 %   Eosinophils Absolute 0.6 (H) 0.0 - 0.5 K/uL   Basophils Relative 0 %   Basophils Absolute 0.1 0.0 - 0.1 K/uL   WBC Morphology MILD LEFT SHIFT (1-5% METAS, OCC MYELO, OCC BANDS)     Comment: TOXIC GRANULATION VACUOLATED NEUTROPHILS    RBC Morphology MORPHOLOGY UNREMARKABLE    Smear Review Normal platelet morphology    Immature Granulocytes 3 %   Abs Immature Granulocytes 1.17 (H) 0.00 - 0.07 K/uL    Comment: Performed at [facility], [address] [place], [place] [address]  Comprehensive metabolic panel     Status: Abnormal   Collection Time: 11/17/20 12:38 AM  Result Value Ref Range   Sodium 136 135 - 145 mmol/L   Potassium 3.7 3.5 - 5.1 mmol/L   Chloride 103 98 - 111 mmol/L   CO2 23 22 - 32 mmol/L   Glucose, Bld 132 (H) 70 - 99 mg/dL    Comment: Glucose reference range applies only to samples taken after fasting for at least 8 hours.   BUN 33 (H) 8 - 23 mg/dL   Creatinine, Ser 1.15 (H) 0.44 - 1.00 mg/dL   Calcium 10.4 (H) 8.9 - 10.3 mg/dL   Total Protein 7.1 6.5 - 8.1 g/dL   Albumin 3.4 (L) 3.5 - 5.0 g/dL   AST 18 15 - 41 U/L   ALT 17 0 - 44 U/L   Alkaline Phosphatase 97 38 - 126 U/L   Total  Bilirubin 0.7 0.3 - 1.2 mg/dL   GFR, Estimated 49 (L) >60 mL/min    Comment: (NOTE) Calculated using the CKD-EPI Creatinine Equation (2021)    Anion gap 10 5 - 15    Comment: Performed at [facility], [address] [place], [place] [address]  Brain natriuretic peptide     Status: Abnormal   Collection Time: 11/17/20 12:38 AM  Result Value Ref Range   B Natriuretic Peptide 119.6 (H) 0.0 - 100.0 pg/mL    Comment: Performed at [facility], [place], [place] [address]  Troponin I (High Sensitivity)     Status: Abnormal   Collection Time: 11/17/20 12:38 AM  Result Value Ref Range   Troponin I (High Sensitivity) 27 (H) <18 ng/L    Comment: (NOTE) Elevated high sensitivity troponin I (hsTnI) values and significant  changes across serial measurements may suggest ACS but many other  chronic and acute conditions are known to elevate hsTnI results.  Refer to the "Links" section for chest pain algorithms and additional  guidance. Performed at [facility], [address] [place], [place] [address]   Lithium level     Status: Abnormal   Collection Time: 11/17/20 12:39 AM  Result Value Ref Range   Lithium Lvl 0.52 (L) 0.60 - 1.20 mmol/L    Comment: Performed at [facility], [address] [place], [place] [address]  Troponin I (High  Sensitivity)     Status: Abnormal   Collection Time: 11/17/20  3:02 AM  Result Value Ref Range   Troponin I (High Sensitivity) 28 (H) <18 ng/L    Comment: (NOTE) Elevated high sensitivity troponin I (hsTnI) values and significant  changes across serial measurements may suggest ACS but many other  chronic and acute conditions are known to elevate hsTnI results.  Refer to the "Links" section for chest pain algorithms and additional  guidance. Performed at [facility], [address] [place], [place] [address]   Procalcitonin     Status: None   Collection Time: 11/17/20  3:02 AM  Result  Value Ref Range   Procalcitonin 0.13 ng/mL    Comment:        Interpretation: PCT (Procalcitonin) <= 0.5 ng/mL: Systemic infection (sepsis) is not likely. Local bacterial infection is possible. (NOTE)       Sepsis PCT Algorithm           Lower Respiratory Tract                                      Infection PCT Algorithm    ----------------------------     ----------------------------         PCT < 0.25 ng/mL                PCT < 0.10 ng/mL          Strongly encourage             Strongly discourage   discontinuation of antibiotics    initiation of antibiotics    ----------------------------     -----------------------------       PCT 0.25 - 0.50 ng/mL            PCT 0.10 - 0.25 ng/mL               OR       >80% decrease in PCT            Discourage initiation of                                            antibiotics      Encourage discontinuation           of antibiotics    ----------------------------     -----------------------------         PCT >= 0.50 ng/mL              PCT 0.26 - 0.50 ng/mL               AND        <80% decrease in PCT             Encourage initiation of                                             antibiotics       Encourage continuation           of antibiotics    ----------------------------     -----------------------------        PCT >= 0.50 ng/mL  PCT > 0.50 ng/mL               AND         increase in PCT                  Strongly encourage                                      initiation of antibiotics    Strongly encourage escalation           of antibiotics                                     -----------------------------                                           PCT <= 0.25 ng/mL                                                 OR                                        > 80% decrease in PCT                                      Discontinue / Do not initiate                                             antibiotics  Performed at  [facility], [address] [place], [place] [address]   Protime-INR     Status: Abnormal   Collection Time: 11/17/20  4:51 AM  Result Value Ref Range   Prothrombin Time 15.7 (H) 11.4 - 15.2 seconds   INR 1.3 (H) 0.8 - 1.2    Comment: (NOTE) INR goal varies based on device and disease states. Performed at [facility], [address] [place], [place] [address]   Cortisol-am, blood     Status: None   Collection Time: 11/17/20  4:51 AM  Result Value Ref Range   Cortisol - AM 9.6 6.7 - 22.6 ug/dL    Comment: Performed at [facility] [address], [place],  [address]  Procalcitonin     Status: None   Collection Time: 11/17/20  4:51 AM  Result Value Ref Range   Procalcitonin 0.10 ng/mL    Comment:        Interpretation: PCT (Procalcitonin) <= 0.5 ng/mL: Systemic infection (sepsis) is not likely. Local bacterial infection is possible. (NOTE)       Sepsis PCT Algorithm           Lower Respiratory Tract  Infection PCT Algorithm    ----------------------------     ----------------------------         PCT < 0.25 ng/mL                PCT < 0.10 ng/mL          Strongly encourage             Strongly discourage   discontinuation of antibiotics    initiation of antibiotics    ----------------------------     -----------------------------       PCT 0.25 - 0.50 ng/mL            PCT 0.10 - 0.25 ng/mL               OR       >80% decrease in PCT            Discourage initiation of                                            antibiotics      Encourage discontinuation           of antibiotics    ----------------------------     -----------------------------         PCT >= 0.50 ng/mL              PCT 0.26 - 0.50 ng/mL               AND        <80% decrease in PCT             Encourage initiation of                                             antibiotics       Encourage continuation           of antibiotics     ----------------------------     -----------------------------        PCT >= 0.50 ng/mL                  PCT > 0.50 ng/mL               AND         increase in PCT                  Strongly encourage                                      initiation of antibiotics    Strongly encourage escalation           of antibiotics                                     -----------------------------                                           PCT <= 0.25 ng/mL  OR                                        > 80% decrease in PCT                                      Discontinue / Do not initiate                                             antibiotics  Performed at [facility], [address] [place], [place] [address]   Basic metabolic panel     Status: Abnormal   Collection Time: 11/17/20  4:51 AM  Result Value Ref Range   Sodium 139 135 - 145 mmol/L   Potassium 3.4 (L) 3.5 - 5.1 mmol/L   Chloride 106 98 - 111 mmol/L   CO2 25 22 - 32 mmol/L   Glucose, Bld 153 (H) 70 - 99 mg/dL    Comment: Glucose reference range applies only to samples taken after fasting for at least 8 hours.   BUN 31 (H) 8 - 23 mg/dL   Creatinine, Ser 1.04 (H) 0.44 - 1.00 mg/dL   Calcium 9.5 8.9 - 10.3 mg/dL   GFR, Estimated 55 (L) >60 mL/min    Comment: (NOTE) Calculated using the CKD-EPI Creatinine Equation (2021)    Anion gap 8 5 - 15    Comment: Performed at [facility], [address] [place], [place] [address]  CBC     Status: Abnormal   Collection Time: 11/17/20  4:51 AM  Result Value Ref Range   WBC 28.2 (H) 4.0 - 10.5 K/uL   RBC 3.51 (L) 3.87 - 5.11 MIL/uL   Hemoglobin 11.0 (L) 12.0 - 15.0 g/dL   HCT 34.4 (L) 36.0 - 46.0 %   MCV 98.0 80.0 - 100.0 fL   MCH 31.3 26.0 - 34.0 pg   MCHC 32.0 30.0 - 36.0 g/dL   RDW 13.8 11.5 - 15.5 %   Platelets 226 150 - 400 K/uL   nRBC 0.0 0.0 - 0.2 %    Comment: Performed at [facility], [address], [place], [place] [address]    Current Facility-Administered Medications  Medication Dose Route Frequency Provider Last Rate Last Admin  . 0.9 %  sodium chloride infusion   Intravenous Continuous Nolberto Hanlon, MD 75 mL/hr at 11/17/20 0856 Rate Change at 11/17/20 0856  . acetaminophen (TYLENOL) tablet 650 mg  650 mg Oral Q4H PRN Mansy, Jan A, MD      . atorvastatin (LIPITOR) tablet 20 mg  20 mg Oral QPM Mansy, Jan A, MD      . barrier cream (non-specified) 1 application  1 application Topical PRN Mansy, Jan A, MD      . benztropine (COGENTIN) tablet 0.5 mg  0.5 mg Oral BID Mansy, Jan A, MD   0.5 mg at 11/17/20 0947  . buPROPion (WELLBUTRIN XL) 24 hr tablet 150 mg  150 mg Oral BH-q7a Mansy, Jan A, MD   150 mg at 11/17/20 0946  . camphor-menthol (SARNA) lotion   Topical BID PRN Mansy, Jan A, MD      . ceFEPIme (MAXIPIME) 2 g  in sodium chloride 0.9 % 100 mL IVPB  2 g Intravenous Q12H Mansy, Arvella Merles, MD   Stopped at 11/17/20 0329  . cholecalciferol (VITAMIN D3) tablet 2,000 Units  2,000 Units Oral q AM Mansy, Arvella Merles, MD   2,000 Units at 11/17/20 1256  . clonazePAM (KLONOPIN) tablet 0.5 mg  0.5 mg Oral BID PRN Mansy, Jan A, MD      . dexamethasone (DECADRON) tablet 4 mg  4 mg Oral Q12H Mansy, Jan A, MD   4 mg at 11/17/20 0946  . enoxaparin (LOVENOX) injection 40 mg  40 mg Subcutaneous Q24H Mansy, Jan A, MD   40 mg at 11/17/20 1009  . fluPHENAZine decanoate (PROLIXIN) injection 25 mg  25 mg Intramuscular Q30 days Mansy, Jan A, MD      . guaiFENesin (ROBITUSSIN) 100 MG/5ML solution 300 mg  300 mg Oral Q6H PRN Mansy, Jan A, MD      . haloperidol lactate (HALDOL) injection 1 mg  1 mg Intravenous Q6H PRN Reiko Vinje, Madie Reno, MD      . lactated ringers infusion   Intravenous Continuous Paulette Blanch, MD   Stopped at 11/17/20 0413  . lactobacillus acidophilus & bulgar (LACTINEX) chewable tablet 1 tablet  1 tablet Oral Daily Mansy, Jan A, MD      . lithium carbonate capsule 150 mg  150 mg Oral BID WC Mansy, Jan  A, MD   150 mg at 11/17/20 0947  . loperamide (IMODIUM) capsule 4 mg  4 mg Oral PRN Mansy, Jan A, MD      . loratadine (CLARITIN) tablet 10 mg  10 mg Oral Daily Mansy, Jan A, MD   10 mg at 11/17/20 0946  . magnesium hydroxide (MILK OF MAGNESIA) suspension 30 mL  30 mL Oral Daily PRN Mansy, Jan A, MD      . metroNIDAZOLE (FLAGYL) IVPB 500 mg  500 mg Intravenous Q8H Mansy, Jan A, MD 100 mL/hr at 11/17/20 1254 500 mg at 11/17/20 1254  . morphine 2 MG/ML injection 1-2 mg  1-2 mg Intravenous Q4H PRN Mansy, Jan A, MD   1 mg at 11/17/20 1126  . OLANZapine zydis (ZYPREXA) disintegrating tablet 10 mg  10 mg Oral QHS Ely Ballen T, MD      . ondansetron [facility]) tablet 4 mg  4 mg Oral Q6H PRN Mansy, Jan A, MD       Or  . ondansetron [facility]) injection 4 mg  4 mg Intravenous Q6H PRN Mansy, Jan A, MD      . saccharomyces boulardii (FLORASTOR) capsule 250 mg  250 mg Oral Daily Mansy, Jan A, MD   250 mg at 11/17/20 0946  . traZODone (DESYREL) tablet 25 mg  25 mg Oral QHS PRN Mansy, Jan A, MD      . traZODone (DESYREL) tablet 50 mg  50 mg Oral QHS Mansy, Jan A, MD      . Derrill Memo ON 11/18/2020] vancomycin (VANCOREADY) IVPB 500 mg/100 mL  500 mg Intravenous Q24H Mansy, Arvella Merles, MD        Musculoskeletal: Strength & Muscle Tone: decreased Gait & Station: unsteady Patient leans: N/A            Psychiatric Specialty Exam:  Presentation  General Appearance: No data recorded Eye Contact:No data recorded Speech:No data recorded Speech Volume:No data recorded Handedness:No data recorded  Mood and Affect  Mood:No data recorded Affect:No data recorded  Thought Process  Thought Processes:No data recorded Descriptions of Associations:No data recorded  Orientation:No data recorded Thought Content:No data recorded History of Schizophrenia/Schizoaffective disorder:No data recorded Duration of Psychotic Symptoms:No data recorded Hallucinations:No data recorded Ideas of Reference:No data  recorded Suicidal Thoughts:No data recorded Homicidal Thoughts:No data recorded  Sensorium  Memory:No data recorded Judgment:No data recorded Insight:No data recorded  Executive Functions  Concentration:No data recorded Attention Span:No data recorded Recall:No data recorded [employer] [place] recorded Language:No data recorded  Psychomotor Activity  Psychomotor Activity:No data recorded  Assets  Assets:No data recorded  Sleep  Sleep:No data recorded  Physical Exam: Physical Exam Vitals and nursing note reviewed.  Constitutional:      Appearance: Normal appearance. She is ill-appearing.  HENT:     Head: Normocephalic and atraumatic.     Mouth/Throat:     Pharynx: Oropharynx is clear.  Eyes:     Pupils: Pupils are equal, round, and reactive to light.  Cardiovascular:     Rate and Rhythm: Normal rate and regular rhythm.  Pulmonary:     Effort: Pulmonary effort is normal.     Breath sounds: Normal breath sounds.  Abdominal:     General: Abdomen is flat.     Palpations: Abdomen is soft.  Musculoskeletal:        General: Normal range of motion.  Skin:    General: Skin is warm and dry.  Neurological:     General: No focal deficit present.     Mental Status: Mental status is at baseline.  Psychiatric:        Attention and Perception: She is inattentive.        Mood and Affect: Affect is blunt.        Speech: She is noncommunicative.    Review of Systems  Unable to perform ROS: Mental status change  Neurological: Negative.    Blood pressure 96/79, pulse 79, temperature 97.8 F (36.6 C), temperature source Oral, resp. rate 19, height 5\' 5"  (1.651 m), weight 57.3 kg, SpO2 94 %. Body mass index is 21.02 kg/m.  Treatment Plan Summary: Medication management and Plan 80 year old woman presents as delirious.  Current symptoms very unlikely to have any direct connection to her history of past chronic mental illness.  Instead she has plenty of obvious reasons  to become delirious.  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  Next she has been placed on 4 mg of Decadron twice daily since being diagnosed with these.  I explained to the daughter-in-law that while high potency steroids like this can be absolutely crucial with cancer in the nervous system they can also have side effects and are probably contributing or being the main cause of sleepiness this delirium and confusion and agitation.  Additionally patient is presumably having pain intermittently and is unable to communicate that effectively to the treatment team.  Urine analysis looks like it is maybe borderline not clear whether that is infected.  I explained to the daughter-in-law that I made the following medicine recommendations: Continue her current lithium at its modest dose.  Her renal function for now is fine.  No reason to risk complete discontinuation as this can be monitored safely.  I am somewhat concerned about the bupropion because of its risk of lowering seizure threshold.  Nevertheless she has been stable on it for a long time and it is a low dose.  If the patient were to have a seizure I would suggest immediately discontinuing the bupropion but I think we can continue it for now.  Also  find to continue the Prolixin decanoate which was just given within the last day and would not be needing to be repeated for 2 weeks.  Meanwhile I suggested that the best medications for managing acute delirium and agitation or antipsychotics which we will try to dose in a way that will provide relief and allow her to sleep but not necessarily keep her knocked out such that she cannot enjoy time with family.  Olanzapine 10 mg at night dissolving tablet ordered.  As needed intravenous haloperidol ordered for acute agitation.  Explained to her that things may get worse and we may need to make alterations.  Also that patient will need to have her pain managed although right now she  that seems to be under some control.  Daughter was appreciative and expressed understanding of the plan.  I will follow up while she is in the hospital.  Disposition: Patient does not meet criteria for psychiatric inpatient admission. Supportive therapy provided about ongoing stressors.  Alethia Berthold, MD 11/17/2020 1:16 PM

## 2020-11-17 NOTE — ED Notes (Addendum)
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 Was d/c from hosp yesterday with palliative care to [facility] and was supposed to be on pain meds, son states staff at snf state pt was not discharged on pain meds but was given an order to take oxycodone today. Son reports pt has ams d/t restlessness, being nonverbal/mumbling, and did not sleep at all last night so told snf to send pt to ed for eval.  Provided pt with pillow and warm blankets, dimmed light for comfort

## 2020-11-17 NOTE — Progress Notes (Signed)
Patient oxygen saturation was 88 %. MD made aware and bump it upto 3 l and oxygen is 91 % on 3l

## 2020-11-17 NOTE — H&P (Addendum)
[place]   PATIENT NAME: Danielle Steele    MR#:  [id number]  DATE OF BIRTH:  [birth year]  DATE OF ADMISSION:  11/17/2020  PRIMARY CARE PHYSICIAN: Abby Potash, PA-C   Patient is coming from: Skilled nursing facility  REQUESTING/REFERRING PHYSICIAN: Lurline Hare, MD CHIEF COMPLAINT:   Chief Complaint  Patient presents with  . altered mental status    HISTORY OF PRESENT ILLNESS:  Danielle Steele is a 80 y.o. Caucasian female with medical history significant for anxiety, bipolar disorder, hypertension, GERD and schizoaffective disorder, who presented to the emergency room with acute onset of altered mental status.  Her skilled nursing facility staff reported that she is normally interactive and conversant but has been found unresponsive and its unknown how long she has been.  She seemed to have visual hallucinations.  She was not able to follow commands.  No history could be obtained from the patient and she was nonverbal during my interview.  She was recently admitted here from [facility] [id number]/2022 and managed for urinary retention acute kidney injury with tension cytosis she had a brain MRI revealing metastatic disease no started on Decadron then for improvement of her mental status.  No reported fever or chills. ED Course: When she came to the ER vital signs were within normal and later respiratory rate was 24.  Labs revealed a calcium of 10.4 and creatinine 1.1 with BUN of 33.  BNP was 190.6 and high-sensitivity troponin I 27 then 28.  Lactic acid was 1.6 and procalcitonin 0.13.  CBC showed significant leukocytosis with 35 with neutrophilia, up from 22.2 on 413.  Urinalysis showed 55 WBCs negative EKG as reviewed by me : Showed nonspecific T wave abnormality in lateralowed no sinus rhythm with a rate of81 Imaging: Chest x-ray showed no acute cardiopulmonary disease and noncontrasted head CT scan revealed no acute intracranial abnormalities.  The patient was given empiric  antibiotic therapy with IV cefepime, vancomycin and Flagyl given her significant leukocytosis and tachypnea.  She will be admitted to a medical monitored bed for further evaluation and management. PAST MEDICAL HISTORY:   Past Medical History:  Diagnosis Date  . Anxiety   . Bipolar disorder ([place])   . Depression   . GERD (gastroesophageal reflux disease)   . Hypertension   . PND (post-nasal drip)    CAUSES SOME COUGH  . Pre-diabetes   . Schizophrenia ([place])    SCHIZO  AFFECTIVE DISORDER  . Thyroid nodule   . Tremors of nervous system     PAST SURGICAL HISTORY:   Past Surgical History:  Procedure Laterality Date  . CATARACT EXTRACTION W/PHACO Left 09/07/2017   Procedure: CATARACT EXTRACTION PHACO AND INTRAOCULAR LENS PLACEMENT (IOC);  Surgeon: Birder Robson, MD;  Location: [facility] ORS;  Service: Ophthalmology;  Laterality: Left;  [place] 00:39.9 AP% 17.4 CDE 6.94 Fluid Pack lot # [id number]  . CATARACT EXTRACTION W/PHACO Right 09/28/2017   Procedure: CATARACT EXTRACTION PHACO AND INTRAOCULAR LENS PLACEMENT (IOC);  Surgeon: Birder Robson, MD;  Location: [facility] ORS;  Service: Ophthalmology;  Laterality: Right;  [place] 01:31.8 AP% 13.7 CDE 12.55 Fluid Pack Lot # [id number]   . EYE SURGERY    . TONSILLECTOMY      SOCIAL HISTORY:   Social History   Tobacco Use  . Smoking status: Former Research scientist (life sciences)  . Smokeless tobacco: Never Used  Substance Use Topics  . Alcohol use: No    FAMILY HISTORY:  No family history on file.  Unobtainable due to the patient's  nonverbal status.  DRUG ALLERGIES:   Allergies  Allergen Reactions  . Citrus Rash    REVIEW OF SYSTEMS:   ROS As per HPI otherwise unobtainable due to the patient nonverbal status and altered mental status.  MEDICATIONS AT HOME:   Prior to Admission medications   Medication Sig Start Date End Date Taking? Authorizing Provider  acetaminophen (TYLENOL) 325 MG tablet Take 650 mg by mouth every 4 (four) hours as needed for mild pain  or moderate pain.    [provider]  atorvastatin (LIPITOR) 20 MG tablet Take 20 mg by mouth every evening.    [provider]  barrier cream (NON-SPECIFIED) CREA Apply 1 application topically as needed (apply topically after toileting as needed to prevent skin breakdown).    [provider]  benztropine (COGENTIN) 0.5 MG tablet Take 0.5 mg by mouth 2 (two) times daily.    [provider]  buPROPion (WELLBUTRIN XL) 150 MG 24 hr tablet Take 150 mg by mouth every morning.    [provider]  Camphor-Menthol (MEN-PHOR EX) Apply 1 application topically 2 (two) times daily as needed.    [provider]  cetirizine (ZYRTEC) 10 MG tablet Take 10 mg by mouth daily. 10/28/20   [provider]  Cholecalciferol ([employer] VITAMIN D3) 25 MCG (1000 UT) CHEW Chew 2,000 Units by mouth in the morning.    [provider]  clonazePAM (KLONOPIN) 0.5 MG tablet Take 1 tablet (0.5 mg total) by mouth 2 (two) times daily as needed for anxiety. 11/15/20   Nolberto Hanlon, MD  Cranberry 250 MG CAPS Take 500 mg by mouth daily.     [provider]  dexamethasone (DECADRON) 4 MG tablet Take 1 tablet (4 mg total) by mouth every 12 (twelve) hours. 11/15/20   Nolberto Hanlon, MD  diclofenac Sodium (VOLTAREN) 1 % GEL Apply topically. 10/04/20   [provider]  Difluprednate (DUREZOL) 0.05 % EMUL Apply 1 drop to eye 2 (two) times daily.    [provider]  fluPHENAZine decanoate (PROLIXIN) 25 MG/ML injection Inject 25 mg into the muscle every 30 (thirty) days.    [provider]  guaiFENesin (ROBITUSSIN) 100 MG/5ML liquid Take 300 mg by mouth every 6 (six) hours as needed for cough.     [provider]  ketoconazole (NIZORAL) 2 % cream Apply 1 application topically 2 (two) times daily.    [provider]  lactobacilus acidophilus & bulgar (FLORANEX) TABS chewable tablet Take 1 tablet by mouth daily. 10/28/20   [provider]  lithium carbonate 150 MG capsule Take 150 mg by mouth 2 (two) times daily with a meal.    [provider]  loperamide (IMODIUM A-D) 2 MG tablet Take 4 mg by mouth as needed for diarrhea or loose stools (No more than 8 doses in 24 hours).     [provider]  magnesium hydroxide (MILK OF MAGNESIA) 400 MG/5ML suspension Take 30 mLs by mouth daily as needed for mild constipation.    [provider]  saccharomyces boulardii (FLORASTOR) 250 MG capsule Take 250 mg by mouth daily. Patient not taking: Reported on 11/08/2020    [provider]  traZODone (DESYREL) 50 MG tablet Take 50 mg by mouth at bedtime.    [provider]      VITAL SIGNS:  Blood pressure 133/76, pulse 72, temperature 98.4 F (36.9 C), temperature source Oral, resp. rate 20, SpO2 100 %.  PHYSICAL EXAMINATION:  Physical Exam  GENERAL:  80 y.o.-year-old patient lying in the bed with no acute distress.  She was slightly restless and not very cooperative with exam. EYES: Pupils equal, round, reactive to light and accommodation. No scleral icterus. Extraocular muscles intact.  HEENT: Head atraumatic, normocephalic. Oropharynx and nasopharynx clear.  NECK:  Supple, no jugular venous distention. No thyroid enlargement, no tenderness.  LUNGS: Normal breath sounds bilaterally, no wheezing, rales,rhonchi or crepitation. No use of accessory muscles of respiration.  CARDIOVASCULAR: Regular rate and rhythm, S1, S2 normal. No murmurs, rubs, or gallops.  ABDOMEN: Soft, nondistended, nontender. Bowel sounds present. No organomegaly or mass.  EXTREMITIES: No pedal edema, cyanosis, or clubbing.  NEUROLOGIC: Cranial nerves II through XII are intact. Muscle strength 5/5 in all extremities. Sensation intact. Gait not checked.  PSYCHIATRIC: The patient is alert and globally confused.  No good eye contact. SKIN: No obvious rash, lesion, or ulcer.   LABORATORY PANEL:   CBC Recent Labs   Lab 11/17/20 0038  WBC 35.0*  HGB 13.0  HCT 40.2  PLT 251   ------------------------------------------------------------------------------------------------------------------  Chemistries  Recent Labs  Lab 11/10/20 0422 11/17/20 0038  NA 140 136  K 3.7 3.7  CL 112* 103  CO2 21* 23  GLUCOSE 100* 132*  BUN 14 33*  CREATININE 0.96 1.15*  CALCIUM 9.8 10.4*  MG 2.2  --   AST 16 18  ALT 11 17  ALKPHOS 93 97  BILITOT 0.4 0.7   ------------------------------------------------------------------------------------------------------------------  Cardiac Enzymes No results for input(s): TROPONINI in the last 168 hours. ------------------------------------------------------------------------------------------------------------------  RADIOLOGY:  CT Head Wo Contrast  Result Date: 11/17/2020 CLINICAL DATA:  Altered mental status EXAM: CT HEAD WITHOUT CONTRAST TECHNIQUE: Contiguous axial images were obtained from the base of the skull through the vertex without intravenous contrast. COMPARISON:  11/08/2020 FINDINGS: Brain: Age related volume loss. No acute intracranial abnormality. Specifically, no hemorrhage, hydrocephalus, mass lesion, acute infarction, or significant intracranial injury. Vascular: No hyperdense vessel or unexpected calcification. Skull: No acute calvarial abnormality. Sinuses/Orbits: No acute findings Other: None IMPRESSION: No acute intracranial abnormality. Electronically Signed   By: Rolm Baptise M.D.   On: 11/17/2020 00:57   DG Chest Port 1 View  Result Date: 11/17/2020 CLINICAL DATA:  Altered mental status EXAM: PORTABLE CHEST 1 VIEW COMPARISON:  11/08/2020 FINDINGS: Heart and mediastinal contours are within normal limits. No focal opacities or effusions. No acute bony abnormality. Aortic atherosclerosis. IMPRESSION: No active disease. Electronically Signed   By: Rolm Baptise M.D.   On: 11/17/2020 01:30      IMPRESSION AND PLAN:  Active Problems:   Altered  mental status  1.  Altered mental status with confusion and significant leukocytosis with tachypnea, and lactic acid 1.6  and acute kidney injury likely hypovolemic, with SIRS, rule out sepsis. -The patient be admitted to a medical monitored bed. - We will continue empiric IV antibiotic therapy with cefepime, vancomycin and Flagyl. - We will follow blood cultures as well as urine culture and sensitivity. - She will be hydrated with IV normal saline. - We will follow her significant leukocytosis. - We will follow lactic acid level. - Neurochecks will be followed every 4 hours for 24 hours. - Psychiatric consult will be obtained. - I notified Dr. Weber Cooks about the the patient.  2.  Dyslipidemia. - We will continue statin therapy.  3.  Depression. - We will continue Wellbutrin XL.  4.  Bipolar disorder. - We will continue her lithium.  5.  Schizoaffective disorder. - We will continue  her Prolixin and Cogentin.   DVT prophylaxis: Lovenox. Code Status: The patient is DNR/DNI. Family Communication:  The plan of care was discussed in details with the patient (with no family members in the room). I answered all questions. The patient agreed to proceed with the above mentioned plan. Further management will depend upon hospital course. Disposition Plan: Back to skilled nursing facility environment Consults called: Psychiatry consult. All the records are reviewed and case discussed with ED provider.  Status is: Inpatient  Remains inpatient appropriate because:Altered mental status, Ongoing diagnostic testing needed not appropriate for outpatient work up, Unsafe d/c plan, IV treatments appropriate due to intensity of illness or inability to take PO and Inpatient level of care appropriate due to severity of illness   Dispo: The patient is from: SNF              Anticipated d/c is to: SNF              Patient currently is not medically stable to d/c.   Difficult to place patient  No   TOTAL TIME TAKING CARE OF THIS PATIENT: 55 minutes.    Christel Mormon M.D on 11/17/2020 at 2:53 AM  [facility]   From 7 PM-7 AM, contact night-coverage [email or web address]  CC: Primary care physician; Abby Potash, PA-C

## 2020-11-17 NOTE — ED Triage Notes (Signed)
Pt with ams from [facility]. Pt arrives alert to self only, per ems pt is normally a and o x4.

## 2020-11-17 NOTE — Progress Notes (Signed)
Unable to complete admission intake d/t patient's mental status. No family present at bedside.

## 2020-11-17 NOTE — Consult Note (Signed)
Pharmacy Antibiotic Note  Danielle Steele is a 80 y.o. female admitted on 11/17/2020 with sepsis.  Pharmacy has been consulted for cefepime and vancomycin dosing.  Plan: Cefepime 2 g q12H   Vancomycin 1000 mg x 1 loading dose ordered. Will order vancomycin 500 mg q24H maintenance dose for a predicted AUC of 452. Goal AUC of 400-550. Plan to order level in 4-5 days. Used Scr 1.15, and TBW for CrCl and Ke calculations.      Temp (24hrs), Avg:98.4 F (36.9 C), Min:98.4 F (36.9 C), Max:98.4 F (36.9 C)  Recent Labs  Lab 11/10/20 0422 11/11/20 0831 11/13/20 0918 11/17/20 0038  WBC 21.4* 24.1* 22.2* 35.0*  CREATININE 0.96  --   --  1.15*  LATICACIDVEN  --   --   --  1.6    Estimated Creatinine Clearance: 31.8 mL/min (A) (by C-G formula based on SCr of 1.15 mg/dL (H)).    Allergies  Allergen Reactions  . Citrus Rash    Antimicrobials this admission: 4/17 flagyl >>  4/17 cefepime >>  4/17 vancomycin >>   Dose adjustments this admission: None  Microbiology results: 4/17 BCx: pending 4/17 UCx: pending   Thank you for allowing pharmacy to be a part of this patient's care.  Oswald Hillock, PharmD, BCPS 11/17/2020 2:57 AM

## 2020-11-18 DIAGNOSIS — Z515 Encounter for palliative care: Secondary | ICD-10-CM

## 2020-11-18 DIAGNOSIS — R410 Disorientation, unspecified: Secondary | ICD-10-CM | POA: Diagnosis not present

## 2020-11-18 LAB — CREATININE, SERUM
Creatinine, Ser: 1 mg/dL (ref 0.44–1.00)
GFR, Estimated: 58 mL/min — ABNORMAL LOW (ref >60)

## 2020-11-18 LAB — URINE CULTURE: Culture: NO GROWTH

## 2020-11-18 MED ORDER — HALOPERIDOL LACTATE 5 MG/ML IJ SOLN
1.0000 mg | INTRAMUSCULAR | Status: DC | PRN
  Administered 2020-11-18: 2 mg via INTRAVENOUS
  Filled 2020-11-18: qty 1

## 2020-11-18 MED ORDER — MORPHINE SULFATE (PF) 2 MG/ML IV SOLN
2.0000 mg | INTRAVENOUS | Status: DC | PRN
  Administered 2020-11-18 (×2): 2 mg via INTRAVENOUS
  Filled 2020-11-18 (×2): qty 1

## 2020-11-18 MED ORDER — HALOPERIDOL LACTATE 5 MG/ML IJ SOLN: 2.0000 mg | INTRAMUSCULAR | Status: DC | PRN

## 2020-11-18 MED ORDER — MORPHINE SULFATE (PF) 4 MG/ML IV SOLN
4.0000 mg | INTRAVENOUS | Status: DC | PRN
  Administered 2020-11-18 – 2020-11-19 (×7): 4 mg via INTRAVENOUS
  Filled 2020-11-18 (×7): qty 1

## 2020-11-18 MED ORDER — CHLORHEXIDINE GLUCONATE CLOTH 2 % EX PADS
6.0000 | MEDICATED_PAD | Freq: Every day | CUTANEOUS | Status: DC
  Administered 2020-11-18: 09:00:00 6 via TOPICAL

## 2020-11-18 NOTE — Progress Notes (Signed)
PROGRESS NOTE    Verneal Steele  FBP:[id number] DOB: [birth year] DOA: 11/17/2020 PCP: Abby Potash, PA-C    Brief Narrative:  Danielle Steele is a 80 y.o. Caucasian female with medical history significant for anxiety, bipolar disorder, hypertension, GERD and schizoaffective disorder, who presented to the emergency room with acute onset of altered mental status.  Her skilled nursing facility staff reported that she is normally interactive and conversant but has been found unresponsive and its unknown how long she has been.  She seemed to have visual hallucinations.  She was not able to follow commands.  No history could be obtained from the patient and she was nonverbal during my interview.  She was recently admitted here from 482 415/2022 and managed for urinary retention acute kidney injury with tension cytosis she had a brain MRI revealing metastatic disease no started on Decadron then for improvement of her mental status.  No reported fever or chills.  4/18-confused still. Family at bedsome. Interested in hospice house  Consultants:   Lillia Pauls, hospice, psychiatry  Procedures:   Antimicrobials:       Subjective: Denies pain. Confused.   Objective: Vitals:   11/17/20 1512 11/17/20 2045 11/18/20 0500 11/18/20 0758  BP: (!) 104/55 (!) 111/54 (!) 128/98 (!) 161/74  Pulse: 84 61 87 87  Resp: 17 18 18 14   Temp: 98.1 F (36.7 C) 98 F (36.7 C) 98.2 F (36.8 C) 98.5 F (36.9 C)  TempSrc: Oral Oral Oral Axillary  SpO2: 96% 100% 96% 98%  Weight:      Height:        Intake/Output Summary (Last 24 hours) at 11/18/2020 1404 Last data filed at 11/18/2020 1300 Gross per 24 hour  Intake 2319.01 ml  Output 4600 ml  Net -2280.99 ml   Filed Weights   11/17/20 0405  Weight: 57.3 kg    Examination:  General exam: Appears calm and comfortable  Respiratory system: Clear to auscultation. Respiratory effort normal. Cardiovascular system: S1 & S2 heard, RRR. No JVD,  murmurs, rubs, gallops or clicks.  Gastrointestinal system: Abdomen is nondistended, soft and nontender.. Normal bowel sounds heard. Central nervous system: Difficult to assess Extremities: No edema Skin: Warm dry Psychiatry: Confused, not agitated this a.m.    Data Reviewed: I have personally reviewed following labs and imaging studies  CBC: Recent Labs  Lab 11/13/20 0918 11/17/20 0038 11/17/20 0451  WBC 22.2* 35.0* 28.2*  NEUTROABS  --  29.9*  --   HGB 11.9* 13.0 11.0*  HCT 35.9* 40.2 34.4*  MCV 95.2 96.6 98.0  PLT 326 251 824   Basic Metabolic Panel: Recent Labs  Lab 11/17/20 0038 11/17/20 0451 11/18/20 0346  NA 136 139  --   K 3.7 3.4*  --   CL 103 106  --   CO2 23 25  --   GLUCOSE 132* 153*  --   BUN 33* 31*  --   CREATININE 1.15* 1.04* 1.00  CALCIUM 10.4* 9.5  --    GFR: Estimated Creatinine Clearance: 41.7 mL/min (by C-G formula based on SCr of 1 mg/dL). Liver Function Tests: Recent Labs  Lab 11/17/20 0038  AST 18  ALT 17  ALKPHOS 97  BILITOT 0.7  PROT 7.1  ALBUMIN 3.4*   No results for input(s): LIPASE, AMYLASE in the last 168 hours. No results for input(s): AMMONIA in the last 168 hours. Coagulation Profile: Recent Labs  Lab 11/17/20 0451  INR 1.3*   Cardiac Enzymes: No results for input(s): CKTOTAL, CKMB, CKMBINDEX, TROPONINI  in the last 168 hours. BNP (last 3 results) No results for input(s): PROBNP in the last 8760 hours. HbA1C: No results for input(s): HGBA1C in the last 72 hours. CBG: No results for input(s): GLUCAP in the last 168 hours. Lipid Profile: No results for input(s): CHOL, HDL, LDLCALC, TRIG, CHOLHDL, LDLDIRECT in the last 72 hours. Thyroid Function Tests: No results for input(s): TSH, T4TOTAL, FREET4, T3FREE, THYROIDAB in the last 72 hours. Anemia Panel: No results for input(s): VITAMINB12, FOLATE, FERRITIN, TIBC, IRON, RETICCTPCT in the last 72 hours. Sepsis Labs: Recent Labs  Lab 11/17/20 0038 11/17/20 0302  11/17/20 0451  PROCALCITON  --  0.13 0.10  LATICACIDVEN 1.6  --   --     Recent Results (from the past 240 hour(s))  MRSA PCR Screening     Status: None   Collection Time: 11/08/20  9:23 PM   Specimen: Nasal Mucosa; Nasopharyngeal  Result Value Ref Range Status   MRSA by PCR NEGATIVE NEGATIVE Final    Comment:        The GeneXpert MRSA Assay (FDA approved for NASAL specimens only), is one component of a comprehensive MRSA colonization surveillance program. It is not intended to diagnose MRSA infection nor to guide or monitor treatment for MRSA infections. Performed at [facility], [place], [place] [address]   SARS CORONAVIRUS 2 (TAT 6-24 HRS) Nasopharyngeal Nasopharyngeal Swab     Status: None   Collection Time: 11/11/20  5:57 PM   Specimen: Nasopharyngeal Swab  Result Value Ref Range Status   SARS Coronavirus 2 NEGATIVE NEGATIVE Final    Comment: (NOTE) SARS-CoV-2 target nucleic acids are NOT DETECTED.  The SARS-CoV-2 RNA is generally detectable in upper and lower respiratory specimens during the acute phase of infection. Negative results do not preclude SARS-CoV-2 infection, do not rule out co-infections with other pathogens, and should not be used as the sole basis for treatment or other patient management decisions. Negative results must be combined with clinical observations, patient history, and epidemiological information. The expected result is Negative.  Fact Sheet for Patients: SugarRoll.be  Fact Sheet for Healthcare Providers: https://www.woods-mathews.com/  This test is not yet approved or cleared by the [place] FDA and  has been authorized for detection and/or diagnosis of SARS-CoV-2 by FDA under an Emergency Use Authorization (EUA). This EUA will remain  in effect (meaning this test can be used) for the duration of the COVID-19 declaration under Se ction 564(b)(1) of the Act, 21  U.S.C. section 360bbb-3(b)(1), unless the authorization is terminated or revoked sooner.  Performed at [facility], [place] [address] [place], [place] [address]   Resp Panel by RT-PCR (Flu A&B, Covid) Nasopharyngeal Swab     Status: None   Collection Time: 11/15/20 10:36 AM   Specimen: Nasopharyngeal Swab; Nasopharyngeal(NP) swabs in vial transport medium  Result Value Ref Range Status   SARS Coronavirus 2 by RT PCR NEGATIVE NEGATIVE Final    Comment: (NOTE) SARS-CoV-2 target nucleic acids are NOT DETECTED.  The SARS-CoV-2 RNA is generally detectable in upper respiratory specimens during the acute phase of infection. The lowest concentration of SARS-CoV-2 viral copies this assay can detect is 138 copies/mL. A negative result does not preclude SARS-Cov-2 infection and should not be used as the sole basis for treatment or other patient management decisions. A negative result may occur with  improper specimen collection/handling, submission of specimen other than nasopharyngeal swab, presence of viral mutation(s) within the areas targeted by this assay,  and inadequate number of viral copies(<138 copies/mL). A negative result must be combined with clinical observations, patient history, and epidemiological information. The expected result is Negative.  Fact Sheet for Patients:  EntrepreneurPulse.com.au  Fact Sheet for Healthcare Providers:  IncredibleEmployment.be  This test is no t yet approved or cleared by the [place] FDA and  has been authorized for detection and/or diagnosis of SARS-CoV-2 by FDA under an Emergency Use Authorization (EUA). This EUA will remain  in effect (meaning this test can be used) for the duration of the COVID-19 declaration under Section 564(b)(1) of the Act, 21 U.S.C.section 360bbb-3(b)(1), unless the authorization is terminated  or revoked sooner.       Influenza A by PCR NEGATIVE NEGATIVE Final    Influenza B by PCR NEGATIVE NEGATIVE Final    Comment: (NOTE) The Xpert Xpress SARS-CoV-2/FLU/RSV plus assay is intended as an aid in the diagnosis of influenza from Nasopharyngeal swab specimens and should not be used as a sole basis for treatment. Nasal washings and aspirates are unacceptable for Xpert Xpress SARS-CoV-2/FLU/RSV testing.  Fact Sheet for Patients: EntrepreneurPulse.com.au  Fact Sheet for Healthcare Providers: IncredibleEmployment.be  This test is not yet approved or cleared by the [place] FDA and has been authorized for detection and/or diagnosis of SARS-CoV-2 by FDA under an Emergency Use Authorization (EUA). This EUA will remain in effect (meaning this test can be used) for the duration of the COVID-19 declaration under Section 564(b)(1) of the Act, 21 U.S.C. section 360bbb-3(b)(1), unless the authorization is terminated or revoked.  Performed at [facility], [address] [place], [place] [address]   Culture, blood (routine x 2)     Status: None (Preliminary result)   Collection Time: 11/17/20 12:31 AM   Specimen: BLOOD LEFT FOREARM  Result Value Ref Range Status   Specimen Description BLOOD LEFT FOREARM  Final   Special Requests   Final    BOTTLES DRAWN AEROBIC AND ANAEROBIC Blood Culture adequate volume   Culture   Final    NO GROWTH 1 DAY Performed at [facility], [address], [place], [place] [address]    Report Status PENDING  Incomplete  Urine culture     Status: None   Collection Time: 11/17/20 12:31 AM   Specimen: Urine, Clean Catch  Result Value Ref Range Status   Specimen Description   Final    URINE, CLEAN CATCH Performed at [facility], [address] [place], [place] [address]    Special Requests   Final    NONE Performed at [facility], [address], [place], [place] [address]    Culture   Final    NO GROWTH Performed at [place], [place] [address] [place],  [address]    Report Status 11/18/2020 FINAL  Final  Resp Panel by RT-PCR (Flu A&B, Covid)     Status: None   Collection Time: 11/17/20 12:31 AM   Specimen: Nasopharyngeal(NP) swabs in vial transport medium  Result Value Ref Range Status   SARS Coronavirus 2 by RT PCR NEGATIVE NEGATIVE Final    Comment: (NOTE) SARS-CoV-2 target nucleic acids are NOT DETECTED.  The SARS-CoV-2 RNA is generally detectable in upper respiratory specimens during the acute phase of infection. The lowest concentration of SARS-CoV-2 viral copies this assay can detect is 138 copies/mL. A negative result does not preclude SARS-Cov-2 infection and should not be used as the sole basis for treatment or other patient management decisions. A negative  result may occur with  improper specimen collection/handling, submission of specimen other than nasopharyngeal swab, presence of viral mutation(s) within the areas targeted by this assay, and inadequate number of viral copies(<138 copies/mL). A negative result must be combined with clinical observations, patient history, and epidemiological information. The expected result is Negative.  Fact Sheet for Patients:  EntrepreneurPulse.com.au  Fact Sheet for Healthcare Providers:  IncredibleEmployment.be  This test is no t yet approved or cleared by the [place] FDA and  has been authorized for detection and/or diagnosis of SARS-CoV-2 by FDA under an Emergency Use Authorization (EUA). This EUA will remain  in effect (meaning this test can be used) for the duration of the COVID-19 declaration under Section 564(b)(1) of the Act, 21 U.S.C.section 360bbb-3(b)(1), unless the authorization is terminated  or revoked sooner.       Influenza A by PCR NEGATIVE NEGATIVE Final   Influenza B by PCR NEGATIVE NEGATIVE Final    Comment: (NOTE) The Xpert Xpress SARS-CoV-2/FLU/RSV plus assay is intended as an  aid in the diagnosis of influenza from Nasopharyngeal swab specimens and should not be used as a sole basis for treatment. Nasal washings and aspirates are unacceptable for Xpert Xpress SARS-CoV-2/FLU/RSV testing.  Fact Sheet for Patients: EntrepreneurPulse.com.au  Fact Sheet for Healthcare Providers: IncredibleEmployment.be  This test is not yet approved or cleared by the [place] FDA and has been authorized for detection and/or diagnosis of SARS-CoV-2 by FDA under an Emergency Use Authorization (EUA). This EUA will remain in effect (meaning this test can be used) for the duration of the COVID-19 declaration under Section 564(b)(1) of the Act, 21 U.S.C. section 360bbb-3(b)(1), unless the authorization is terminated or revoked.  Performed at [facility], [address] [place], [place] [address]   Culture, blood (routine x 2)     Status: None (Preliminary result)   Collection Time: 11/17/20 12:36 AM   Specimen: Right Antecubital; Blood  Result Value Ref Range Status   Specimen Description RIGHT ANTECUBITAL  Final   Special Requests   Final    BOTTLES DRAWN AEROBIC AND ANAEROBIC Blood Culture adequate volume   Culture   Final    NO GROWTH 1 DAY Performed at [facility], [address], [place], [place] [address]    Report Status PENDING  Incomplete         Radiology Studies: CT Head Wo Contrast  Result Date: 11/17/2020 CLINICAL DATA:  Altered mental status EXAM: CT HEAD WITHOUT CONTRAST TECHNIQUE: Contiguous axial images were obtained from the base of the skull through the vertex without intravenous contrast. COMPARISON:  11/08/2020 FINDINGS: Brain: Age related volume loss. No acute intracranial abnormality. Specifically, no hemorrhage, hydrocephalus, mass lesion, acute infarction, or significant intracranial injury. Vascular: No hyperdense vessel or unexpected calcification. Skull: No acute calvarial  abnormality. Sinuses/Orbits: No acute findings Other: None IMPRESSION: No acute intracranial abnormality. Electronically Signed   By: Rolm Baptise M.D.   On: 11/17/2020 00:57   DG Chest Port 1 View  Result Date: 11/17/2020 CLINICAL DATA:  Altered mental status EXAM: PORTABLE CHEST 1 VIEW COMPARISON:  11/08/2020 FINDINGS: Heart and mediastinal contours are within normal limits. No focal opacities or effusions. No acute bony abnormality. Aortic atherosclerosis. IMPRESSION: No active disease. Electronically Signed   By: Rolm Baptise M.D.   On: 11/17/2020 01:30        Scheduled Meds: . benztropine  0.5 mg Oral BID  . Chlorhexidine Gluconate Cloth  6 each Topical Daily  . [START ON  11/19/2020] fluPHENAZine decanoate  25 mg Intramuscular Q30 days  . lithium carbonate  150 mg Oral BID WC  . loratadine  10 mg Oral Daily  . OLANZapine zydis  10 mg Oral QHS  . saccharomyces boulardii  250 mg Oral Daily  . traZODone  50 mg Oral QHS   Continuous Infusions: . sodium chloride 75 mL/hr at 11/17/20 1720    Assessment & Plan:   Principal Problem:   Acute delirium Active Problems:   Schizophrenia ([place])   Bipolar disorder (HCC)   Altered mental status   1.  Altered mental status with confusion and significant leukocytosis with tachypnea, and lactic acid 1.6  and acute kidney injury likely hypovolemic, with SIRS, rule out sepsis. 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 Family would like to place her on comfort measures and transfer to hospice house when bed is available Palliative care and hospice consulted Dr. Janese Banks oncology was okay with patient discontinuing Decadron  2.  Dyslipidemia. Was on statin therapy  3.  Depression. Was on Wellbutrin XL, psychiatry.  Chat message okay to discontinue Wellbutrin at this point  4.  Bipolar disorder. Continue  lithium  5.  Schizoaffective disorder. Continue Cogentin    DVT prophylaxis: SCD Code Status: DNR Family Communication: Brother at bedside  Status is: Inpatient  Remains inpatient appropriate because:Inpatient level of care appropriate due to severity of illness   Dispo: The patient is from: SNF              Anticipated d/c is to: hospice house when bed available              Patient currently is not medically stable to d/c.   Difficult to place patient No            LOS: 1 day   Time spent: 35 minutes with more than 50% on [place], MD [facility] Pager [phone]  If 7PM-7AM, please contact night-coverage 11/18/2020, 2:04 PM

## 2020-11-18 NOTE — Progress Notes (Addendum)
[facility]) Hospital Liaison RN note:  Received request from Altha Harm, NP for family interest in [place]. Chart reviewed and eligibility was approved. Spoke with brother, [name], over the phone to confirm interest and explain services. He verbalized understanding and all questions were answered. He will complete registration paperwork if a bed becomes available. Unfortunately, [facility] is not able to offer a room today. Hospital care team is aware. [place] Liaison will continue to follow for room availability.  Please call with any hospice related questions or concerns.  Thank you for the opportunity to participate in this patient's care.  Zandra Abts, RN [facility] Liaison  [phone]

## 2020-11-18 NOTE — Plan of Care (Addendum)
End of Shift Summary:  Alert and disoriented, attempts made at reorientation. Speech unintelligible at times. VSS. Weaned to 2L [place]. Prn anti-anxiety and pain medications to assist with agitation. Pt pulling at Ivs, foley catheter and cardiac leads, attempting to climb over bed rail - Mitts places bilaterally, bed alarm on, fall mat in place, tele discontinued. MIVF continued. Remained free from falls or injury. Frequent intentional rounding in lieu of call bell use.   Problem: Health Behavior/Discharge Planning: Goal: Ability to manage health-related needs will improve 11/18/2020 0642 by Wende Crease, RN Outcome: Progressing 11/18/2020 0308 by Wende Crease, RN Outcome: Progressing   Problem: Clinical Measurements: Goal: Ability to maintain clinical measurements within normal limits will improve 11/18/2020 0642 by Wende Crease, RN Outcome: Progressing 11/18/2020 0308 by Wende Crease, RN Outcome: Progressing Goal: Will remain free from infection 11/18/2020 0642 by Wende Crease, RN Outcome: Progressing 11/18/2020 0308 by Wende Crease, RN Outcome: Progressing Goal: Diagnostic test results will improve 11/18/2020 0642 by Wende Crease, RN Outcome: Progressing 11/18/2020 0308 by Wende Crease, RN Outcome: Progressing Goal: Respiratory complications will improve 11/18/2020 0642 by Wende Crease, RN Outcome: Progressing 11/18/2020 0308 by Wende Crease, RN Outcome: Progressing Goal: Cardiovascular complication will be avoided 11/18/2020 0642 by Wende Crease, RN Outcome: Progressing 11/18/2020 0308 by Wende Crease, RN Outcome: Progressing   Problem: Safety: Goal: Ability to remain free from injury will improve 11/18/2020 0642 by Wende Crease, RN Outcome: Progressing 11/18/2020 0308 by Wende Crease, RN Outcome: Progressing   Problem: Pain Managment: Goal: General experience of comfort will improve 11/18/2020 0642 by  Wende Crease, RN Outcome: Progressing 11/18/2020 0308 by Wende Crease, RN Outcome: Progressing

## 2020-11-18 NOTE — TOC Progression Note (Signed)
Transition of Care [facility] - Progression Note    Patient Details  Name: Danielle Steele MRN: [id number] Date of Birth: Apr 21, 1941  Transition of Care [facility]) CM/SW Contact  [name], RN Phone Number: 11/18/2020, 4:24 PM  Clinical Narrative:    Patient has a bed at the [facility] home for tomorrow.  [facility] will pick patient up at 1200.  Son is going to sign consents at the hospice facility at 11 am.    Expected Discharge Plan: [place] Barriers to Discharge: Hospice Bed not available  Expected Discharge Plan and Services Expected Discharge Plan: [place] In-house Referral: Hospice / Palliative Care Discharge Planning Services: CM Consult Post Acute Care Choice: Hospice                   DME Arranged: N/A DME Agency: NA       HH Arranged: NA           Social Determinants of Health (SDOH) Interventions    Readmission Risk Interventions Readmission Risk Prevention Plan 11/18/2020  Transportation Screening Complete  Medication Review [employer]) Complete  PCP or Specialist appointment within 3-5 days of discharge Complete  HRI or Home Care Consult Complete  SW Recovery Care/Counseling Consult Complete  Palliative Care Screening Complete  Skilled Nursing Facility Complete  Some recent data might be hidden

## 2020-11-18 NOTE — Progress Notes (Signed)
I met with patients brother [name] and his wife. Patient is still agitated and trying to pull off her mittens. Haldol 1-2 mg was not controlling her symptoms. I have increased haldol dose to 2-4 mg Q4 prn and morphine to 4 mg q1 prn to keep patient comfortable. Plan is to discharge to home hospice in am  Dr. Randa Evens, MD, MPH [facility] [phone] 11/18/2020 5:19 PM

## 2020-11-18 NOTE — TOC Initial Note (Signed)
Transition of Care [facility]) - Initial/Assessment Note    Patient Details  Name: Danielle Steele MRN: [id number] Date of Birth: 1941-05-19  Transition of Care [facility] CM/SW Contact:    Danielle Hutching, RN Phone Number: 11/18/2020, 2:20 PM  Clinical Narrative:                 Patient admitted to the hospital with Delirium.  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  Patient discharged on 4/15 to [facility] for short term rehab followed by OP Palliative.  This admission family has decided on comfort measures and is okay with residential hospice.  Danielle Steele with [facility] given the referral for residential hospice.  There are no hospice beds available today.   TOC will cont to follow.   Expected Discharge Plan: [place] Barriers to Discharge: Hospice Bed not available   Patient Goals and CMS Choice Patient states their goals for this hospitalization and ongoing recovery are:: Family has decided on comfort care and residential hospice CMS Medicare.gov Compare Post Acute Care list provided to:: Patient Represenative (must comment) Choice offered to / list presented to : Sibling  Expected Discharge Plan and Services Expected Discharge Plan: [place] In-house Referral: Hospice / Palliative Care Discharge Planning Services: CM Consult Post Acute Care Choice: Hospice                   DME Arranged: N/A DME Agency: NA       HH Arranged: NA          Prior Living Arrangements/Services     Patient language and need for interpreter reviewed:: Yes Do you feel safe going back to the place where you live?: Yes      Need for Family Participation in Patient Care: Yes (Comment) (confort care) Care giver support system in place?: Yes (comment) (brother and sister) Current home services: DME Criminal Activity/Legal Involvement Pertinent to Current Situation/Hospitalization: No - Comment as needed  Activities of Daily Living Home  Assistive Devices/Equipment: Wheelchair ADL Screening (condition at time of admission) Patient's cognitive ability adequate to safely complete daily activities?: No Is the patient deaf or have difficulty hearing?: No Does the patient have difficulty seeing, even when wearing glasses/contacts?: No Does the patient have difficulty concentrating, remembering, or making decisions?: Yes Patient able to express need for assistance with ADLs?: No Does the patient have difficulty dressing or bathing?: Yes Independently performs ADLs?: No Does the patient have difficulty walking or climbing stairs?: Yes Weakness of Legs: Both Weakness of Arms/Hands: Both  Permission Sought/Granted Permission sought to share information with : Facility Government social research officer granted to share information with : Yes, Verbal Permission Granted  Share Information with NAME: Danielle Steele  Permission granted to share info w AGENCY: [place] granted to share info w Relationship: brother     Emotional Assessment Appearance:: Appears older than stated age     Orientation: : Fluctuating Orientation (Suspected and/or reported Sundowners) Alcohol / Substance Use: Not Applicable Psych Involvement: No (comment)  Admission diagnosis:  Altered mental status [R41.82] AKI (acute kidney injury) ([address]) [N17.9] Altered mental status, unspecified altered mental status type [R41.82] Leukocytosis, unspecified type [D72.829] Patient Active Problem List   Diagnosis Date Noted  . Altered mental status 11/17/2020  . Acute delirium 11/17/2020  . Primary malignant neoplasm of lung metastatic to other site [facility])   . Brain metastases ([place])   . Goals of care, counseling/discussion   . Palliative  care encounter   . Bone metastases ([place])   . Lymphadenopathy   . Abdominal pain 11/08/2020  . UTI (urinary tract infection) 11/08/2020  . Sepsis ([place]) 11/08/2020  . Acute metabolic encephalopathy 38/93/7342   . Schizophrenia ([place])   . Bipolar disorder ([place])   . Depression   . Anxiety   . Hypertension   . HLD (hyperlipidemia)   . AKI (acute kidney injury) ([place])   . Abnormal CT scan    PCP:  Danielle Potash, PA-C Pharmacy:   [place], [place] [address] [place] [address] Phone: [phone] Fax: [phone]     Social Determinants of Health (SDOH) Interventions    Readmission Risk Interventions Readmission Risk Prevention Plan 11/18/2020  Transportation Screening Complete  Medication Review ([name]) Complete  PCP or Specialist appointment within 3-5 days of discharge Complete  HRI or Home Care Consult Complete  SW Recovery Care/Counseling Consult Complete  Palliative Care Screening Complete  Skilled Nursing Facility Complete  Some recent data might be hidden

## 2020-11-18 NOTE — Consult Note (Signed)
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 earlier today.  Per family, patient has not slept in multiple days.  She has been seen by psychiatry due to acute delirium.  Patient was started on olanzapine at bedtime.  She is also received as needed Haldol and morphine.  I met with patient's sister-in-law and spoke with her brother, [name] by phone.  [name] is her healthcare power of attorney.  Family recommends the patient is not doing well.  They are concerned that her delirium is causing a significant amount of distress and discomfort.  Patient is not felt to be a viable candidate for XRT or systemic chemotherapy.  Family verbalized that their primary goal at this point is to keep patient comfortable until end-of-life.  They have requested transfer to hospice facility for end-of-life care.  We discussed option of transitioning patient to comfort care only and discontinuing on on comfort medications.  Family verbalized agreement with comfort care.  I called and gave her a referral to the hospice liaison who will coordinate transfer to hospice facility.  PLAN: -She is comfort care -Discontinue dexamethasone -Liberalize Haldol and morphine for comfort -Transfer to hospice home when a bed is  available  Case and plan discussed with Drs. Elberta Leatherwood, and Clapacs   Time Total: 60 minutes  Visit consisted of counseling and education dealing with the complex and emotionally intense issues of symptom management and palliative care in the setting of serious and potentially life-threatening illness.Greater than 50%  of this time was spent counseling and coordinating care related to the above assessment and plan.  Signed by: Altha Harm, PhD, NP-C

## 2020-11-19 ENCOUNTER — Ambulatory Visit: Payer: Medicare Other

## 2020-11-19 MED ORDER — HALOPERIDOL LACTATE 5 MG/ML IJ SOLN: 2.0000 mg | INTRAMUSCULAR | Status: AC | PRN

## 2020-11-19 MED ORDER — MORPHINE SULFATE (PF) 4 MG/ML IV SOLN: 4.0000 mg | INTRAVENOUS | 0 refills | Status: AC | PRN

## 2020-11-19 MED ORDER — LOPERAMIDE HCL 2 MG PO CAPS: 4.0000 mg | ORAL_CAPSULE | ORAL | 0 refills | Status: AC | PRN

## 2020-11-19 MED ORDER — OLANZAPINE 10 MG PO TBDP
10.0000 mg | ORAL_TABLET | Freq: Every day | ORAL | Status: AC
Start: 2020-11-19 — End: ?

## 2020-11-19 MED ORDER — HYDROCERIN EX CREA: 1.0000 "application " | TOPICAL_CREAM | CUTANEOUS | 0 refills | Status: AC | PRN

## 2020-11-19 MED ORDER — ONDANSETRON HCL 4 MG PO TABS: 4.0000 mg | ORAL_TABLET | Freq: Four times a day (QID) | ORAL | 0 refills | Status: AC | PRN

## 2020-11-19 MED ORDER — MEN-PHOR 0.5-0.5 % EX LOTN: TOPICAL_LOTION | Freq: Two times a day (BID) | CUTANEOUS | 0 refills | Status: AC | PRN

## 2020-11-19 NOTE — Progress Notes (Signed)
Attempted to call report to [facility] admission nurse. Voicemail left for requesting a return call to this RN, Regino Schultze, at phone number [phone].

## 2020-11-19 NOTE — Progress Notes (Signed)
Pt report provided to [facility]as with [facility]. All questions answered at time of call.

## 2020-11-19 NOTE — Discharge Summary (Signed)
Danielle Steele EHM:[id number] DOB: [birth year] DOA: 11/17/2020  PCP: Abby Potash, PA-C  Admit date: 11/17/2020 Discharge date: 11/19/2020  Admitted From: SNF Disposition:  Hospice house    Discharge Condition:Stable CODE STATUS:DNR  Diet recommendation: regular  Brief/Interim Summary: Per HPI: Danielle Steele is a 80 y.o. Caucasian female with medical history significant for anxiety, bipolar disorder, hypertension, GERD and schizoaffective disorder, who presented to the emergency room with acute onset of altered mental status.  Her skilled nursing facility staff reported that she is normally interactive and conversant but has been found unresponsive and its unknown how long she has been.  She seemed to have visual hallucinations.  She was not able to follow commands.  No history could be obtained from the patient and she was nonverbal during admitter's interview.  She was recently admitted here from [id number] and managed for urinary retention acute kidney injury with tension cytosis she had a brain MRI revealing metastatic disease no started on Decadron then for improvement of her mental status.  No reported fever or chills.  1. Altered mental status with confusion and significant leukocytosis with tachypnea, and lactic acid 1.6 and acute kidney injury likely hypovolemic. 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 Family would like to place her on comfort measures and transfer to hospice house  Palliative care and hospice consulted Dr. Janese Banks oncology was okay with patient discontinuing Decadron  2. Dyslipidemia.   3. Depression. Psychiatry recommended to dc welbutrin   4. Bipolar disorder. Continue lithium  5. Schizoaffective disorder. Continue Cogentin         Discharge Diagnoses:  Principal Problem:   Acute  delirium Active Problems:   Schizophrenia ([place])   Bipolar disorder ([place])   Altered mental status    Discharge Instructions  Discharge Instructions    Diet - low sodium heart healthy   Complete by: As directed    Increase activity slowly   Complete by: As directed      Allergies as of 11/19/2020      Reactions   Citrus Rash      Medication List    STOP taking these medications   acetaminophen 325 MG tablet Commonly known as: TYLENOL   atorvastatin 20 MG tablet Commonly known as: LIPITOR   barrier cream Crea Commonly known as: non-specified Replaced by: hydrocerin Crea   buPROPion 150 MG 24 hr tablet Commonly known as: WELLBUTRIN XL   Cranberry 250 MG Caps   [facility] Vitamin D3 25 MCG (1000 UT) Chew Generic drug: Cholecalciferol   dexamethasone 4 MG tablet Commonly known as: DECADRON   diclofenac Sodium 1 % Gel Commonly known as: VOLTAREN   Durezol 0.05 % Emul Generic drug: Difluprednate   fluPHENAZine decanoate 25 MG/ML injection Commonly known as: PROLIXIN   guaiFENesin 100 MG/5ML liquid Commonly known as: ROBITUSSIN   ketoconazole 2 % cream Commonly known as: NIZORAL   lactobacilus acidophilus & bulgar Tabs chewable tablet   loperamide 2 MG tablet Commonly known as: IMODIUM A-D Replaced by: loperamide 2 MG capsule   saccharomyces boulardii 250 MG capsule Commonly known as: FLORASTOR   traZODone 50 MG tablet Commonly known as: DESYREL     TAKE these medications   benztropine 0.5 MG tablet Commonly known as: COGENTIN Take 0.5 mg by mouth 2 (two) times daily.   cetirizine 10 MG tablet Commonly known as: ZYRTEC Take 10 mg by mouth daily.  clonazePAM 0.5 MG tablet Commonly known as: KLONOPIN Take 1 tablet (0.5 mg total) by mouth 2 (two) times daily as needed for anxiety.   haloperidol lactate 5 MG/ML injection Commonly known as: HALDOL Inject 0.4-0.8 mLs (2-4 mg total) into the vein every 4 (four) hours as needed.   hydrocerin  Crea Apply 1 application topically as needed (apply topically after toileting as needed to prevent skin breakdown). Replaces: barrier cream Crea   lithium carbonate 150 MG capsule Take 150 mg by mouth 2 (two) times daily with a meal.   loperamide 2 MG capsule Commonly known as: IMODIUM Take 2 capsules (4 mg total) by mouth as needed for diarrhea or loose stools (No more than 8 doses in 24 hours). Replaces: loperamide 2 MG tablet   magnesium hydroxide 400 MG/5ML suspension Commonly known as: MILK OF MAGNESIA Take 30 mLs by mouth daily as needed for mild constipation.   MEN-PHOR EX Apply 1 application topically 2 (two) times daily as needed. What changed: Another medication with the same name was added. Make sure you understand how and when to take each.   Men-Phor lotion Generic drug: camphor-menthol Apply topically 2 (two) times daily as needed for itching. What changed: You were already taking a medication with the same name, and this prescription was added. Make sure you understand how and when to take each.   morphine 4 MG/ML injection Inject 1 mL (4 mg total) into the vein every hour as needed for moderate pain or severe pain.   OLANZapine zydis 10 MG disintegrating tablet Commonly known as: ZYPREXA Take 1 tablet (10 mg total) by mouth at bedtime.   ondansetron 4 MG tablet Commonly known as: ZOFRAN Take 1 tablet (4 mg total) by mouth every 6 (six) hours as needed for nausea.       Allergies  Allergen Reactions  . Citrus Rash    Consultations:  Palliative, hospice, psych   Procedures/Studies: CT ABDOMEN PELVIS WO CONTRAST  Result Date: 11/08/2020 CLINICAL DATA:  Abdominal pain and fever EXAM: CT ABDOMEN AND PELVIS WITHOUT CONTRAST TECHNIQUE: Multidetector CT imaging of the abdomen and pelvis was performed following the standard protocol without oral or IV contrast. COMPARISON:  None. FINDINGS: Lower chest: There is bibasilar atelectatic change. No consolidation  in the lung bases. There are foci of coronary artery calcification. Hepatobiliary: No focal liver lesions are appreciable. The gallbladder appears distended. Gallbladder wall does not appear appreciably thickened by CT. No biliary duct dilatation evident. Pancreas: There is no pancreatic mass or inflammatory focus. Spleen: No splenic lesions are evident. Adrenals/Urinary Tract: There is mild adrenal hypertrophy bilaterally. No focal adrenal lesions evident. There is no appreciable renal mass or hydronephrosis on either side. There is no evident renal or ureteral calculus on either side. The urinary bladder appears distended without wall thickening. Stomach/Bowel: There is no appreciable bowel wall or mesenteric thickening. There is moderate stool and air in the colon. There is no evident bowel obstruction. Terminal ileum appears unremarkable. Appendix appears normal. No evident free air or portal venous air. Vascular/Lymphatic: There is no abdominal aortic aneurysm. There is extensive aortic and iliac artery atherosclerotic calcification. No adenopathy is evident in the abdomen or pelvis. Reproductive: Uterus is mildly canted to the left. No adnexal masses are evident. Other: No evident abscess or ascites in the abdomen or pelvis. Musculoskeletal: Relative sclerosis noted in the right iliac bone in a portion of the right acetabulum with mildly irregular periosteum, likely due to Paget's disease. No lytic or  destructive lesions are evident. There is degenerative change in each pubic symphysis. No intramuscular lesions evident. IMPRESSION: 1. 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 Note that there is mildly irregular periosteum in this area. An atypical bony neoplasm could potentially present in this manner. Given this appearance of the periosteum in this area, correlation with MR of the right iliac crest and acetabulum is advised to further evaluate. 2. Gallbladder is mildly distended  without overt wall thickening by CT. No biliary duct dilatation. Correlation with ultrasound of the gallbladder may be advisable in this circumstance. 3. Urinary bladder distended without wall thickening. No renal or ureteral calculus. No hydronephrosis on either side. 4. No evident bowel obstruction. No abscess in the abdomen or pelvis. Appendix region appears normal. 5. Aortic Atherosclerosis (ICD10-I70.0). Foci of coronary artery and iliac artery atherosclerotic calcification noted. 6. Mild adrenal hypertrophy bilaterally without focal adrenal lesion. Significance of mild adrenal hypertrophy is uncertain in this age group. Electronically Signed   By: Lowella Grip III M.D.   On: 11/08/2020 10:30   CT Head Wo Contrast  Result Date: 11/17/2020 CLINICAL DATA:  Altered mental status EXAM: CT HEAD WITHOUT CONTRAST TECHNIQUE: Contiguous axial images were obtained from the base of the skull through the vertex without intravenous contrast. COMPARISON:  11/08/2020 FINDINGS: Brain: Age related volume loss. No acute intracranial abnormality. Specifically, no hemorrhage, hydrocephalus, mass lesion, acute infarction, or significant intracranial injury. Vascular: No hyperdense vessel or unexpected calcification. Skull: No acute calvarial abnormality. Sinuses/Orbits: No acute findings Other: None IMPRESSION: No acute intracranial abnormality. Electronically Signed   By: Rolm Baptise M.D.   On: 11/17/2020 00:57   CT HEAD WO CONTRAST  Result Date: 11/08/2020 CLINICAL DATA:  Mental status change EXAM: CT HEAD WITHOUT CONTRAST TECHNIQUE: Contiguous axial images were obtained from the base of the skull through the vertex without intravenous contrast. COMPARISON:  October 10, 2020 FINDINGS: Brain: No evidence of acute territorial infarction, hemorrhage, hydrocephalus,extra-axial collection or mass lesion/mass effect. There is dilatation the ventricles and sulci consistent with age-related atrophy. Low-attenuation changes in  the deep white matter consistent with small vessel ischemia. Vascular: No hyperdense vessel or unexpected calcification. Skull: The skull is intact. No fracture or focal lesion identified. Sinuses/Orbits: The visualized paranasal sinuses and mastoid air cells are clear. The orbits and globes intact. Other: None IMPRESSION: No acute intracranial abnormality. Findings consistent with age related atrophy and chronic small vessel ischemia Electronically Signed   By: Prudencio Pair M.D.   On: 11/08/2020 15:58   CT GUIDED NEEDLE PLACEMENT  Result Date: 11/11/2020 INDICATION: No known primary, now with multiple of rest of osseous lesions worrisome for metastatic disease. Please perform CT-guided biopsy of permeative lesion involving the anterior aspect of the right ilium for tissue diagnostic purposes. Note, patient also found to have malignant-appearing retroperitoneal lymph nodes however given the size and location of the periaortic lymph nodes decision was made to initially proceed with CT-guided bone lesion biopsy in hopes this biopsy will provided tissue diagnosis in this patient with history of schizophrenia and uncertain ability to tolerate and/or cooperate with a CT-guided biopsy. EXAM: CT-GUIDED BIOPSY INVOLVING THE ANTERIOR ASPECT OF THE RIGHT ILIUM. MEDICATIONS: None COMPARISON:  CT the chest, abdomen and pelvis-earlier same day; pelvic MRI-11/09/2020 ANESTHESIA/SEDATION: Fentanyl 50 mcg IV; Versed 1 mg IV Sedation time: 18 minutes; The patient was continuously monitored during the procedure by the interventional radiology nurse under my direct supervision. COMPLICATIONS: None immediate. PROCEDURE: Informed consent was obtained from the  patient's family following an explanation of the procedure, risks, benefits and alternatives. The patient understands, agrees and consents for the procedure. All questions were addressed. A time out was performed prior to the initiation of the procedure. The patient was  positioned supine on the CT table and a limited CT was performed for procedural planning demonstrating a permeative lesion involving the anterior aspect of the right ilium. The procedure was planned. The operative site was prepped and draped in the usual sterile fashion. Appropriate trajectory was confirmed with a 22 gauge spinal needle after the adjacent tissues were anesthetized with 1% Lidocaine with epinephrine. Next, an 11 gauge coaxial bone biopsy needle was advanced into anterior aspect of the right ilium. The inner 13 gauge coil axial bone biopsy device was then utilized to acquire the initial sample after appropriate position was confirmed with CT imaging (image 11, series 3). Finally, the outer 11 gauge bone biopsy device was utilized to acquire an additional sample (image 15, series 3). The needle was removed and superficial hemostasis was obtained with manual compression. A dressing was applied. The patient tolerated the procedure well without immediate post procedural complication. IMPRESSION: Successful CT guided biopsy of permanent lesion involving the anterior aspect of the right ilium. PLAN: As above, CT-guided bone lesion biopsy was initially pursued given patient's history of schizophrenia and uncertain ability to tolerate and/or cooperate with CT-guided biopsy however ultimately if additional tissue is required attempted CT-guided retroperitoneal lymph node biopsy could be performed as indicated. Electronically Signed   By: Sandi Mariscal M.D.   On: 11/11/2020 11:21   MR BRAIN W WO CONTRAST  Result Date: 11/12/2020 CLINICAL DATA:  Altered mental status. Recently discovered metastatic disease. EXAM: MRI HEAD WITHOUT AND WITH CONTRAST TECHNIQUE: Multiplanar, multiecho pulse sequences of the brain and surrounding structures were obtained without and with intravenous contrast. CONTRAST:  35mL GADAVIST GADOBUTROL 1 MMOL/ML IV SOLN COMPARISON:  Head CT 11/08/2020 FINDINGS: The study is motion degraded  throughout including moderate to severe motion on postcontrast imaging. Brain: There are at least 11 small rounded foci of diffusion weighted signal abnormality involving cortex/gray-white junction predominantly posteriorly in the right greater than left cerebral hemispheres. The largest measures 1.5 cm in the high posterior right frontal lobe with mild surrounding edema. There is also evidence of mild edema surrounding a right temporal-occipital lesion. Assessment for enhancement is severely limited by motion artifact, however there is the suggestion of some enhancement of the dominant lesion in the right frontal lobe (series 20, image 10). No gross intracranial hemorrhage, midline shift, or extra-axial fluid collection is identified. There is moderate cerebral atrophy. Periventricular white matter T2 hyperintensities are nonspecific but compatible with minimal chronic small vessel ischemic disease. No definite lesions are identified in the posterior fossa. Vascular: Major intracranial vascular flow voids are preserved. Skull and upper cervical spine: Abnormal marrow signal in the C2 vertebral body and posterior elements, dens, and posterior C3 vertebral body. Sinuses/Orbits: Bilateral cataract extraction. Trace right maxillary sinus mucosal thickening. Small bilateral mastoid effusions. Other: None. IMPRESSION: 1. Severely motion degraded examination. 2. Multiple small brain lesions with at most mild edema most consistent with metastatic disease. No mass effect. 3. Abnormal marrow signal in the upper cervical spine concerning for metastatic disease. Electronically Signed   By: Logan Bores M.D.   On: 11/12/2020 13:00   MR PELVIS W WO CONTRAST  Result Date: 11/10/2020 CLINICAL DATA:  Right pelvic bone lesion on CT. EXAM: MRI PELVIS WITHOUT AND WITH CONTRAST TECHNIQUE: Multiplanar multisequence  MR imaging of the pelvis was performed both before and after administration of intravenous contrast. CONTRAST:  73mL  GADAVIST GADOBUTROL 1 MMOL/ML IV SOLN COMPARISON:  CT abdomen pelvis dated November 08, 2020. FINDINGS: Musculoskeletal: Large marrow replacing lesion within the right iliac bone extending into the acetabulum with associated periosteal reaction and surrounding reactive edema in the gluteus and iliacus muscles. Additional marrow replacing lesions in the left and right sacral ala (series 11, images 13 and 15) and S1 vertebral body (series 11, image 11. No definite soft tissue mass. No acute fracture or dislocation. Urinary Tract:  Foley catheter within the decompressed bladder. Bowel:  Unremarkable visualized pelvic bowel loops. Vascular/Lymphatic: No pathologically enlarged lymph nodes. No significant vascular abnormality seen. Reproductive:  No mass or other significant abnormality. Other:  None. IMPRESSION: 1. Multiple aggressive appearing marrow replacing lesions in the pelvis, the largest involving the right iliac bone and acetabulum. 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 Electronically Signed   By: Titus Dubin M.D.   On: 11/10/2020 12:59   CT CHEST ABDOMEN PELVIS W CONTRAST  Result Date: 11/11/2020 CLINICAL DATA:  Gastrointestinal cancer. Osseous metastatic disease. Initial staging examination EXAM: CT CHEST, ABDOMEN, AND PELVIS WITH CONTRAST TECHNIQUE: Multidetector CT imaging of the chest, abdomen and pelvis was performed following the standard protocol during bolus administration of intravenous contrast. CONTRAST:  163mL OMNIPAQUE IOHEXOL 300 MG/ML  SOLN COMPARISON:  Noncontrast CT abdomen pelvis 11/08/2020, FINDINGS: CT CHEST FINDINGS Cardiovascular: Extensive multi-vessel coronary artery calcification. Global cardiac size within normal limits. No pericardial effusion. The central pulmonary arteries are of normal caliber. Mild atherosclerotic calcification within the thoracic aorta. No aortic aneurysm. Mediastinum/Nodes: There is necrotic appearing left hilar and subcarinal adenopathy. By  example, left hilar adenopathy measures 2.3 x 2.5 cm at axial image # 26/2. Subcarinal adenopathy measures 1.5 x 2.9 cm at axial image # 30/2. The esophagus is unremarkable. Lungs/Pleura: Mild centrilobular emphysema. Mild bibasilar atelectasis. No focal pulmonary nodules or infiltrates. No pneumothorax or pleural effusion. Left hilar adenopathy results in circumferential narrowing of the left lower lobar pulmonary bronchi. Musculoskeletal: Pathologic appearing compression fracture of T6 with near vertebral plana deformity. Minimal retropulsion of the posteroinferior component of the T6 vertebral body resulting in minimal central canal stenosis. Mixed lytic and sclerotic pattern within the a T3 vertebral body may reflect the presence of underlying metastatic disease in this location. No definite cortical erosion. No associated pathologic fracture. CT ABDOMEN PELVIS FINDINGS Hepatobiliary: No focal liver abnormality is seen. No gallstones, gallbladder wall thickening, or biliary dilatation. Pancreas: Unremarkable Spleen: Unremarkable Adrenals/Urinary Tract: The adrenal glands are unremarkable. The kidneys are normal in size and position. Multiple cortical hypodensities are seen within the kidneys bilaterally which are too small to characterize. A a 17 mm cortical hypodensity, however, within the posterior interpolar region of the left kidney appears solid in nature and may represent a hypoenhancing mass, but is not optimally characterized on this exam. No hydronephrosis. No intrarenal or ureteral calculi. The bladder is unremarkable. Stomach/Bowel: There is polypoid fold thickening within the a mid body of the stomach, best seen on axial image # 53/2 and sagittal image # 122/6 and an underlying gastric mass is difficult to exclude. The stomach, small bowel, and large bowel are otherwise unremarkable save for moderate stool seen throughout the colon. No evidence of obstruction or focal inflammation. The appendix is  not clearly identified and may be absent. No free intraperitoneal gas or fluid. Vascular/Lymphatic: Extensive aortoiliac atherosclerotic calcification. No aortic aneurysm. There is  pathologic retroperitoneal adenopathy within the retrocrural, left periaortic, aortocaval, and retrocaval lymph node groups as well as the common iliac lymph node groups bilaterally. Index lymph node within the left periaortic lymph node group measures 1.9 x 2.0 cm at axial image # 74. Reproductive: Uterus and bilateral adnexa are unremarkable. Other: No abdominal wall hernia.  Rectum unremarkable. Musculoskeletal: Mild sclerosis involving the right ilium and left pubic symphysis. No associated pathologic fracture. With associated periosteal reaction is again identified, better assessed on accompanying MRI examination. The majority of these lesions, however, appear radiographically occult on this exam IMPRESSION: Pathologic left hilar, subcarinal, retrocrural, and retroperitoneal lymphadenopathy. Left periaortic lymphadenopathy may provide a viable target for CT-guided biopsy for tissue sampling. Polypoid fold thickening involving the mid body of the stomach. An underlying primary gastric mass is difficult to exclude on this examination. This would be better assessed with endoscopy or upper gastrointestinal series. 17 mm hypoenhancing suspected solid mass within the left kidney. Dedicated renal sonography may be helpful, as an initial step, for further evaluation. Multiple osseous lesions in keeping with osseous metastatic disease. The majority of these lesions within the pelvis noted on prior MRI examination are radiographically occult. T6 suspected pathologic fracture. Additional possible metastasis involving the T3 vertebral body. This could be confirmed with contrast enhanced MRI examination. Extensive multi-vessel coronary artery calcification. Mild centrilobular emphysema. Aortic Atherosclerosis (ICD10-I70.0) and Emphysema  (ICD10-J43.9). Electronically Signed   By: Fidela Salisbury MD   On: 11/11/2020 02:30   DG Chest Port 1 View  Result Date: 11/17/2020 CLINICAL DATA:  Altered mental status EXAM: PORTABLE CHEST 1 VIEW COMPARISON:  11/08/2020 FINDINGS: Heart and mediastinal contours are within normal limits. No focal opacities or effusions. No acute bony abnormality. Aortic atherosclerosis. IMPRESSION: No active disease. Electronically Signed   By: Rolm Baptise M.D.   On: 11/17/2020 01:30   DG Chest Port 1 View  Result Date: 11/08/2020 CLINICAL DATA:  Questionable sepsis EXAM: PORTABLE CHEST 1 VIEW COMPARISON:  None. FINDINGS: Mild coarsening of lung markings. No focal pneumonia. No edema, effusion, or pneumothorax. Normal heart size and mediastinal contours. IMPRESSION: 1. No focal pneumonia. 2. Coarsened lung markings which could be chronic lung disease or atypical infection. Electronically Signed   By: Monte Fantasia M.D.   On: 11/08/2020 09:46   US ABDOMEN LIMITED RUQ (LIVER/GB)  Result Date: 11/08/2020 CLINICAL DATA:  Right upper quadrant pain EXAM: ULTRASOUND ABDOMEN LIMITED RIGHT UPPER QUADRANT COMPARISON:  None. FINDINGS: Gallbladder: No gallstones or wall thickening visualized. No sonographic Murphy sign noted by sonographer. Common bile duct: Diameter: 7.5 mm, upper normal Liver: No focal lesion identified. Within normal limits in parenchymal echogenicity. Portal vein is patent on color Doppler imaging with normal direction of blood flow towards the liver. Other: None. IMPRESSION: Negative for gallstones. Common bile duct upper normal. No liver lesion. Electronically Signed   By: Franchot Gallo M.D.   On: 11/08/2020 12:45      Subjective: Mumbling, confused. Brother at bedside  Discharge Exam: Vitals:   11/18/20 1621 11/19/20 0744  BP: (!) 151/105 (!) 97/49  Pulse: (!) 116 79  Resp:  (!) 22  Temp: 98 F (36.7 C) 99.2 F (37.3 C)  SpO2: (!) 84%    Vitals:   11/18/20 0500 11/18/20 0758 11/18/20  1621 11/19/20 0744  BP: (!) 128/98 (!) 161/74 (!) 151/105 (!) 97/49  Pulse: 87 87 (!) 116 79  Resp: 18 14  (!) 22  Temp: 98.2 F (36.8 C) 98.5 F (36.9 C) 98  F (36.7 C) 99.2 F (37.3 C)  TempSrc: Oral Axillary Oral   SpO2: 96% 98% (!) 84%   Weight:      Height:        General: Pt is awake, confused Cardiovascular: RRR, S1/S2 +, no rubs, no gallops Respiratory: CTA bilaterally, no wheezing, no rhonchi Abdominal: Soft, NT, ND, bowel sounds + Extremities: no edema    The results of significant diagnostics from this hospitalization (including imaging, microbiology, ancillary and laboratory) are listed below for reference.     Microbiology: Recent Results (from the past 240 hour(s))  SARS CORONAVIRUS 2 (TAT 6-24 HRS) Nasopharyngeal Nasopharyngeal Swab     Status: None   Collection Time: 11/11/20  5:57 PM   Specimen: Nasopharyngeal Swab  Result Value Ref Range Status   SARS Coronavirus 2 NEGATIVE NEGATIVE Final    Comment: (NOTE) SARS-CoV-2 target nucleic acids are NOT DETECTED.  The SARS-CoV-2 RNA is generally detectable in upper and lower respiratory specimens during the acute phase of infection. Negative results do not preclude SARS-CoV-2 infection, do not rule out co-infections with other pathogens, and should not be used as the sole basis for treatment or other patient management decisions. Negative results must be combined with clinical observations, patient history, and epidemiological information. The expected result is Negative.  Fact Sheet for Patients: SugarRoll.be  Fact Sheet for Healthcare Providers: https://www.woods-mathews.com/  This test is not yet approved or cleared by the [place] FDA and  has been authorized for detection and/or diagnosis of SARS-CoV-2 by FDA under an Emergency Use Authorization (EUA). This EUA will remain  in effect (meaning this test can be used) for the duration of the COVID-19  declaration under Se ction 564(b)(1) of the Act, 21 U.S.C. section 360bbb-3(b)(1), unless the authorization is terminated or revoked sooner.  Performed at [facility], [place] [address], [place], [place] [address]   Resp Panel by RT-PCR (Flu A&B, Covid) Nasopharyngeal Swab     Status: None   Collection Time: 11/15/20 10:36 AM   Specimen: Nasopharyngeal Swab; Nasopharyngeal(NP) swabs in vial transport medium  Result Value Ref Range Status   SARS Coronavirus 2 by RT PCR NEGATIVE NEGATIVE Final    Comment: (NOTE) SARS-CoV-2 target nucleic acids are NOT DETECTED.  The SARS-CoV-2 RNA is generally detectable in upper respiratory specimens during the acute phase of infection. The lowest concentration of SARS-CoV-2 viral copies this assay can detect is 138 copies/mL. A negative result does not preclude SARS-Cov-2 infection and should not be used as the sole basis for treatment or other patient management decisions. A negative result may occur with  improper specimen collection/handling, submission of specimen other than nasopharyngeal swab, presence of viral mutation(s) within the areas targeted by this assay, and inadequate number of viral copies(<138 copies/mL). A negative result must be combined with clinical observations, patient history, and epidemiological information. The expected result is Negative.  Fact Sheet for Patients:  EntrepreneurPulse.com.au  Fact Sheet for Healthcare Providers:  IncredibleEmployment.be  This test is no t yet approved or cleared by the [place] FDA and  has been authorized for detection and/or diagnosis of SARS-CoV-2 by FDA under an Emergency Use Authorization (EUA). This EUA will remain  in effect (meaning this test can be used) for the duration of the COVID-19 declaration under Section 564(b)(1) of the Act, 21 U.S.C.section 360bbb-3(b)(1), unless the authorization is terminated  or revoked sooner.        Influenza A by PCR NEGATIVE NEGATIVE Final   Influenza B  by PCR NEGATIVE NEGATIVE Final    Comment: (NOTE) The Xpert Xpress SARS-CoV-2/FLU/RSV plus assay is intended as an aid in the diagnosis of influenza from Nasopharyngeal swab specimens and should not be used as a sole basis for treatment. Nasal washings and aspirates are unacceptable for Xpert Xpress SARS-CoV-2/FLU/RSV testing.  Fact Sheet for Patients: EntrepreneurPulse.com.au  Fact Sheet for Healthcare Providers: IncredibleEmployment.be  This test is not yet approved or cleared by the [place] FDA and has been authorized for detection and/or diagnosis of SARS-CoV-2 by FDA under an Emergency Use Authorization (EUA). This EUA will remain in effect (meaning this test can be used) for the duration of the COVID-19 declaration under Section 564(b)(1) of the Act, 21 U.S.C. section 360bbb-3(b)(1), unless the authorization is terminated or revoked.  Performed at [facility], [address] [place], [place] [address]   Culture, blood (routine x 2)     Status: None (Preliminary result)   Collection Time: 11/17/20 12:31 AM   Specimen: BLOOD LEFT FOREARM  Result Value Ref Range Status   Specimen Description BLOOD LEFT FOREARM  Final   Special Requests   Final    BOTTLES DRAWN AEROBIC AND ANAEROBIC Blood Culture adequate volume   Culture   Final    NO GROWTH 2 DAYS Performed at [facility], [address], [place], [place] [address]    Report Status PENDING  Incomplete  Urine culture     Status: None   Collection Time: 11/17/20 12:31 AM   Specimen: Urine, Clean Catch  Result Value Ref Range Status   Specimen Description   Final    URINE, CLEAN CATCH Performed at [facility], [address] [place], [place] [address]    Special Requests   Final    NONE Performed at [facility], [address], [place], [place] [address]    Culture    Final    NO GROWTH Performed at [facility], [place] [address] [place], [place] [address]    Report Status 11/18/2020 FINAL  Final  Resp Panel by RT-PCR (Flu A&B, Covid)     Status: None   Collection Time: 11/17/20 12:31 AM   Specimen: Nasopharyngeal(NP) swabs in vial transport medium  Result Value Ref Range Status   SARS Coronavirus 2 by RT PCR NEGATIVE NEGATIVE Final    Comment: (NOTE) SARS-CoV-2 target nucleic acids are NOT DETECTED.  The SARS-CoV-2 RNA is generally detectable in upper respiratory specimens during the acute phase of infection. The lowest concentration of SARS-CoV-2 viral copies this assay can detect is 138 copies/mL. A negative result does not preclude SARS-Cov-2 infection and should not be used as the sole basis for treatment or other patient management decisions. A negative result may occur with  improper specimen collection/handling, submission of specimen other than nasopharyngeal swab, presence of viral mutation(s) within the areas targeted by this assay, and inadequate number of viral copies(<138 copies/mL). A negative result must be combined with clinical observations, patient history, and epidemiological information. The expected result is Negative.  Fact Sheet for Patients:  EntrepreneurPulse.com.au  Fact Sheet for Healthcare Providers:  IncredibleEmployment.be  This test is no t yet approved or cleared by the [place] FDA and  has been authorized for detection and/or diagnosis of SARS-CoV-2 by FDA under an Emergency Use Authorization (EUA). This EUA will remain  in effect (meaning this test can be used) for the duration of the COVID-19 declaration under Section 564(b)(1) of the Act, 21 U.S.C.section 360bbb-3(b)(1), unless  the authorization is terminated  or revoked sooner.       Influenza A by PCR NEGATIVE NEGATIVE Final   Influenza B by PCR NEGATIVE NEGATIVE Final    Comment: (NOTE) The Xpert  Xpress SARS-CoV-2/FLU/RSV plus assay is intended as an aid in the diagnosis of influenza from Nasopharyngeal swab specimens and should not be used as a sole basis for treatment. Nasal washings and aspirates are unacceptable for Xpert Xpress SARS-CoV-2/FLU/RSV testing.  Fact Sheet for Patients: EntrepreneurPulse.com.au  Fact Sheet for Healthcare Providers: IncredibleEmployment.be  This test is not yet approved or cleared by the [place] FDA and has been authorized for detection and/or diagnosis of SARS-CoV-2 by FDA under an Emergency Use Authorization (EUA). This EUA will remain in effect (meaning this test can be used) for the duration of the COVID-19 declaration under Section 564(b)(1) of the Act, 21 U.S.C. section 360bbb-3(b)(1), unless the authorization is terminated or revoked.  Performed at [facility], [address] [place], [place] [address]   Culture, blood (routine x 2)     Status: None (Preliminary result)   Collection Time: 11/17/20 12:36 AM   Specimen: Right Antecubital; Blood  Result Value Ref Range Status   Specimen Description RIGHT ANTECUBITAL  Final   Special Requests   Final    BOTTLES DRAWN AEROBIC AND ANAEROBIC Blood Culture adequate volume   Culture   Final    NO GROWTH 2 DAYS Performed at [facility], [address] [place], [place] [address]    Report Status PENDING  Incomplete     Labs: BNP (last 3 results) Recent Labs    11/17/20 0038  BNP 710.6*   Basic Metabolic Panel: Recent Labs  Lab 11/17/20 0038 11/17/20 0451 11/18/20 0346  NA 136 139  --   K 3.7 3.4*  --   CL 103 106  --   CO2 23 25  --   GLUCOSE 132* 153*  --   BUN 33* 31*  --   CREATININE 1.15* 1.04* 1.00  CALCIUM 10.4* 9.5  --    Liver Function Tests: Recent Labs  Lab 11/17/20 0038  AST 18  ALT 17  ALKPHOS 97  BILITOT 0.7  PROT 7.1  ALBUMIN 3.4*   No results for input(s): LIPASE, AMYLASE in the last  168 hours. No results for input(s): AMMONIA in the last 168 hours. CBC: Recent Labs  Lab 11/13/20 0918 11/17/20 0038 11/17/20 0451  WBC 22.2* 35.0* 28.2*  NEUTROABS  --  29.9*  --   HGB 11.9* 13.0 11.0*  HCT 35.9* 40.2 34.4*  MCV 95.2 96.6 98.0  PLT 326 251 226   Cardiac Enzymes: No results for input(s): CKTOTAL, CKMB, CKMBINDEX, TROPONINI in the last 168 hours. BNP: Invalid input(s): POCBNP CBG: No results for input(s): GLUCAP in the last 168 hours. D-Dimer No results for input(s): DDIMER in the last 72 hours. Hgb A1c No results for input(s): HGBA1C in the last 72 hours. Lipid Profile No results for input(s): CHOL, HDL, LDLCALC, TRIG, CHOLHDL, LDLDIRECT in the last 72 hours. Thyroid function studies No results for input(s): TSH, T4TOTAL, T3FREE, THYROIDAB in the last 72 hours.  Invalid input(s): FREET3 Anemia work up No results for input(s): VITAMINB12, FOLATE, FERRITIN, TIBC, IRON, RETICCTPCT in the last 72 hours. Urinalysis    Component Value Date/Time   COLORURINE YELLOW (A) 11/17/2020 0031   APPEARANCEUR CLEAR (A) 11/17/2020 0031   LABSPEC 1.010 11/17/2020 0031   PHURINE 6.0 11/17/2020 0031   GLUCOSEU NEGATIVE 11/17/2020  [place] (A) 11/17/2020 0031   BILIRUBINUR NEGATIVE 11/17/2020 0031   KETONESUR NEGATIVE 11/17/2020 0031   PROTEINUR NEGATIVE 11/17/2020 0031   NITRITE NEGATIVE 11/17/2020 0031   LEUKOCYTESUR NEGATIVE 11/17/2020 0031   Sepsis Labs Invalid input(s): PROCALCITONIN,  WBC,  LACTICIDVEN Microbiology Recent Results (from the past 240 hour(s))  SARS CORONAVIRUS 2 (TAT 6-24 HRS) Nasopharyngeal Nasopharyngeal Swab     Status: None   Collection Time: 11/11/20  5:57 PM   Specimen: Nasopharyngeal Swab  Result Value Ref Range Status   SARS Coronavirus 2 NEGATIVE NEGATIVE Final    Comment: (NOTE) SARS-CoV-2 target nucleic acids are NOT DETECTED.  The SARS-CoV-2 RNA is generally detectable in upper and lower respiratory specimens during  the acute phase of infection. Negative results do not preclude SARS-CoV-2 infection, do not rule out co-infections with other pathogens, and should not be used as the sole basis for treatment or other patient management decisions. Negative results must be combined with clinical observations, patient history, and epidemiological information. The expected result is Negative.  Fact Sheet for Patients: SugarRoll.be  Fact Sheet for Healthcare Providers: https://www.woods-mathews.com/  This test is not yet approved or cleared by the [place] FDA and  has been authorized for detection and/or diagnosis of SARS-CoV-2 by FDA under an Emergency Use Authorization (EUA). This EUA will remain  in effect (meaning this test can be used) for the duration of the COVID-19 declaration under Se ction 564(b)(1) of the Act, 21 U.S.C. section 360bbb-3(b)(1), unless the authorization is terminated or revoked sooner.  Performed at [facility], [place] [address] [place], [place] [address]   Resp Panel by RT-PCR (Flu A&B, Covid) Nasopharyngeal Swab     Status: None   Collection Time: 11/15/20 10:36 AM   Specimen: Nasopharyngeal Swab; Nasopharyngeal(NP) swabs in vial transport medium  Result Value Ref Range Status   SARS Coronavirus 2 by RT PCR NEGATIVE NEGATIVE Final    Comment: (NOTE) SARS-CoV-2 target nucleic acids are NOT DETECTED.  The SARS-CoV-2 RNA is generally detectable in upper respiratory specimens during the acute phase of infection. The lowest concentration of SARS-CoV-2 viral copies this assay can detect is 138 copies/mL. A negative result does not preclude SARS-Cov-2 infection and should not be used as the sole basis for treatment or other patient management decisions. A negative result may occur with  improper specimen collection/handling, submission of specimen other than nasopharyngeal swab, presence of viral mutation(s) within the areas  targeted by this assay, and inadequate number of viral copies(<138 copies/mL). A negative result must be combined with clinical observations, patient history, and epidemiological information. The expected result is Negative.  Fact Sheet for Patients:  EntrepreneurPulse.com.au  Fact Sheet for Healthcare Providers:  IncredibleEmployment.be  This test is no t yet approved or cleared by the [place] FDA and  has been authorized for detection and/or diagnosis of SARS-CoV-2 by FDA under an Emergency Use Authorization (EUA). This EUA will remain  in effect (meaning this test can be used) for the duration of the COVID-19 declaration under Section 564(b)(1) of the Act, 21 U.S.C.section 360bbb-3(b)(1), unless the authorization is terminated  or revoked sooner.       Influenza A by PCR NEGATIVE NEGATIVE Final   Influenza B by PCR NEGATIVE NEGATIVE Final    Comment: (NOTE) The Xpert Xpress SARS-CoV-2/FLU/RSV plus assay is intended as an aid in the diagnosis of influenza from Nasopharyngeal swab specimens and should not be used as a sole basis for treatment. Nasal  washings and aspirates are unacceptable for Xpert Xpress SARS-CoV-2/FLU/RSV testing.  Fact Sheet for Patients: EntrepreneurPulse.com.au  Fact Sheet for Healthcare Providers: IncredibleEmployment.be  This test is not yet approved or cleared by the [place] FDA and has been authorized for detection and/or diagnosis of SARS-CoV-2 by FDA under an Emergency Use Authorization (EUA). This EUA will remain in effect (meaning this test can be used) for the duration of the COVID-19 declaration under Section 564(b)(1) of the Act, 21 U.S.C. section 360bbb-3(b)(1), unless the authorization is terminated or revoked.  Performed at [facility], [address] [place], [place] [address]   Culture, blood (routine x 2)     Status: None (Preliminary  result)   Collection Time: 11/17/20 12:31 AM   Specimen: BLOOD LEFT FOREARM  Result Value Ref Range Status   Specimen Description BLOOD LEFT FOREARM  Final   Special Requests   Final    BOTTLES DRAWN AEROBIC AND ANAEROBIC Blood Culture adequate volume   Culture   Final    NO GROWTH 2 DAYS Performed at [facility], [address] [place], [place] [address]    Report Status PENDING  Incomplete  Urine culture     Status: None   Collection Time: 11/17/20 12:31 AM   Specimen: Urine, Clean Catch  Result Value Ref Range Status   Specimen Description   Final    URINE, CLEAN CATCH Performed at [facility], [address] [place], [place] [address]    Special Requests   Final    NONE Performed at [facility] [address], [place], [place] [address]    Culture   Final    NO GROWTH Performed at [facility], [place] [address], [place],  [address]    Report Status 11/18/2020 FINAL  Final  Resp Panel by RT-PCR (Flu A&B, Covid)     Status: None   Collection Time: 11/17/20 12:31 AM   Specimen: Nasopharyngeal(NP) swabs in vial transport medium  Result Value Ref Range Status   SARS Coronavirus 2 by RT PCR NEGATIVE NEGATIVE Final    Comment: (NOTE) SARS-CoV-2 target nucleic acids are NOT DETECTED.  The SARS-CoV-2 RNA is generally detectable in upper respiratory specimens during the acute phase of infection. The lowest concentration of SARS-CoV-2 viral copies this assay can detect is 138 copies/mL. A negative result does not preclude SARS-Cov-2 infection and should not be used as the sole basis for treatment or other patient management decisions. A negative result may occur with  improper specimen collection/handling, submission of specimen other than nasopharyngeal swab, presence of viral mutation(s) within the areas targeted by this assay, and inadequate number of viral copies(<138 copies/mL). A negative result must be combined  with clinical observations, patient history, and epidemiological information. The expected result is Negative.  Fact Sheet for Patients:  EntrepreneurPulse.com.au  Fact Sheet for Healthcare Providers:  IncredibleEmployment.be  This test is no t yet approved or cleared by the [place] FDA and  has been authorized for detection and/or diagnosis of SARS-CoV-2 by FDA under an Emergency Use Authorization (EUA). This EUA will remain  in effect (meaning this test can be used) for the duration of the COVID-19 declaration under Section 564(b)(1) of the Act, 21 U.S.C.section 360bbb-3(b)(1), unless the authorization is terminated  or revoked sooner.       Influenza A by PCR NEGATIVE NEGATIVE Final   Influenza B by PCR NEGATIVE NEGATIVE Final    Comment: (NOTE) The Xpert Xpress SARS-CoV-2/FLU/RSV plus assay  is intended as an aid in the diagnosis of influenza from Nasopharyngeal swab specimens and should not be used as a sole basis for treatment. Nasal washings and aspirates are unacceptable for Xpert Xpress SARS-CoV-2/FLU/RSV testing.  Fact Sheet for Patients: EntrepreneurPulse.com.au  Fact Sheet for Healthcare Providers: IncredibleEmployment.be  This test is not yet approved or cleared by the [place] FDA and has been authorized for detection and/or diagnosis of SARS-CoV-2 by FDA under an Emergency Use Authorization (EUA). This EUA will remain in effect (meaning this test can be used) for the duration of the COVID-19 declaration under Section 564(b)(1) of the Act, 21 U.S.C. section 360bbb-3(b)(1), unless the authorization is terminated or revoked.  Performed at [facility], [address] [place], [place] [address]   Culture, blood (routine x 2)     Status: None (Preliminary result)   Collection Time: 11/17/20 12:36 AM   Specimen: Right Antecubital; Blood  Result Value Ref Range Status    Specimen Description RIGHT ANTECUBITAL  Final   Special Requests   Final    BOTTLES DRAWN AEROBIC AND ANAEROBIC Blood Culture adequate volume   Culture   Final    NO GROWTH 2 DAYS Performed at [facility], [address] [place], [place] [address]    Report Status PENDING  Incomplete     Time coordinating discharge: Over 30 minutes  SIGNED:   Nolberto Hanlon, MD  [facility] 11/19/2020, 9:12 AM Pager   If 7PM-7AM, please contact night-coverage [email or web address] Password [email or web address]

## 2020-11-19 NOTE — Progress Notes (Signed)
[facility]) Hospital Liaison RN note:  [facility] is able to offer a room today. Patient's brother, [name] will be signing consents at 11am at the [facility] and transportation has been arranged for 12 noon. Hospital care team is aware. I will fax the discharge summary once it is available.  Please call with any hospice related questions or concerns.  Thank you for the opportunity to participate in this patient's care.  Zandra Abts, RN [facility] Liaison [phone]

## 2020-11-19 NOTE — TOC Transition Note (Signed)
Transition of Care [facility]) - CM/SW Discharge Note   Patient Details  Name: Danielle Steele MRN: [id number] Date of Birth: May 20, 1941  Transition [facility]) CM/SW Contact:  [name], RN Phone Number: 11/19/2020, 8:53 AM   Clinical Narrative:    Patient will discharge to the [facility] today.  Transport is arranged for 12pm with [facility].  Brother is signing consents at 11am.    Final next level of care: [place] Barriers to Discharge: Barriers Resolved   Patient Goals and CMS Choice Patient states their goals for this hospitalization and ongoing recovery are:: Family has decided on comfort care and residential hospice CMS Medicare.gov Compare Post Acute Care list provided to:: Patient Represenative (must comment) Choice offered to / list presented to : Sibling  Discharge Placement              Patient chooses bed at: Other - please specify in the comment section below: [facility]) Patient to be transferred to facility by: [facility] Name of family member notified: [name] - Brother Patient and family notified of of transfer: 11/19/20  Discharge Plan and Services In-house Referral: Hospice / Palliative Care Discharge Planning Services: CM Consult Post Acute Care Choice: Hospice          DME Arranged: N/A DME Agency: NA       HH Arranged: NA          Social Determinants of Health (SDOH) Interventions     Readmission Risk Interventions Readmission Risk Prevention Plan 11/18/2020  Transportation Screening Complete  Medication Review Press [employer]) Complete  PCP or Specialist appointment within 3-5 days of discharge Complete  HRI or Home Care Consult Complete  SW Recovery Care/Counseling Consult Complete  Palliative Care Screening Complete  Skilled Nursing Facility Complete  Some recent data might be hidden

## 2020-11-19 NOTE — Plan of Care (Signed)
End of Shift Summary:  Agitated and continuing to pull at tubing despite mitts. Prn medications given as needed. Adequate urine output per foley catheter. Remained on 3L via  overnight. Remained free from falls or injury. Bed low and in locked position. Fall mat in place. Bed alarm on. Frequent intentional rounding in lieu of call bell use.

## 2020-11-20 ENCOUNTER — Ambulatory Visit: Payer: Medicare Other

## 2020-11-21 ENCOUNTER — Other Ambulatory Visit: Payer: Medicare Other

## 2020-11-21 ENCOUNTER — Ambulatory Visit: Payer: Medicare Other

## 2020-11-22 ENCOUNTER — Ambulatory Visit: Payer: Medicare Other

## 2020-11-22 LAB — CULTURE, BLOOD (ROUTINE X 2)
Culture: NO GROWTH
Culture: NO GROWTH
Special Requests: ADEQUATE
Special Requests: ADEQUATE

## 2020-11-25 ENCOUNTER — Ambulatory Visit: Payer: Medicare Other

## 2020-11-26 ENCOUNTER — Ambulatory Visit: Payer: Medicare Other

## 2020-11-27 ENCOUNTER — Ambulatory Visit: Payer: Medicare Other

## 2020-11-28 ENCOUNTER — Ambulatory Visit: Payer: Medicare Other

## 2020-11-29 ENCOUNTER — Ambulatory Visit: Payer: Medicare Other

## 2020-11-29 ENCOUNTER — Inpatient Hospital Stay: Payer: Medicare Other | Admitting: Hospice and Palliative Medicine

## 2020-11-29 ENCOUNTER — Inpatient Hospital Stay: Payer: Medicare Other | Admitting: Oncology

## 2020-12-01 NOTE — Progress Notes (Signed)
Tumor Board Documentation  Danielle Steele was presented by Dr Janese Banks at our Tumor Board on 12/20/2020, which included representatives from medical oncology,radiation oncology,internal medicine,navigation,pathology,radiology,surgical,pharmacy,genetics,research,pulmonology.  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  Additionally, we reviewed previous medical and familial history, history of present illness, and recent lab results along with all available histopathologic and imaging studies. The tumor board considered available treatment options and made the following recommendations:   Pt went to [place]  The following procedures/referrals were also placed: No orders of the defined types were placed in this encounter.   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  Tumor board is a meeting of clinicians from various specialty areas who evaluate and discuss patients for whom a multidisciplinary approach is being considered. Final determinations in the plan of care are those of the provider(s). The responsibility for follow up of recommendations given during tumor board is that of the provider.   Today's extended care, comprehensive team conference, Danielle Steele was not present for the discussion and was not examined.   Multidisciplinary Tumor Board is a multidisciplinary case peer review process.  Decisions discussed in the Multidisciplinary Tumor Board reflect the opinions of the specialists present at the conference without having examined the patient.  Ultimately, treatment and diagnostic decisions rest with the primary provider(s) and the patient.

## 2020-12-01 DEATH — deceased

## 2020-12-02 ENCOUNTER — Ambulatory Visit: Payer: Medicare Other

## 2020-12-03 ENCOUNTER — Ambulatory Visit: Payer: Medicare Other
